# Patient Record
Sex: Female | Born: 1980 | Race: White | Hispanic: No | Smoking: Current some day smoker
Health system: Southern US, Community
[De-identification: ages and names within clinical notes are randomized; demographics above are authoritative.]

## PROBLEM LIST (undated history)

## (undated) DIAGNOSIS — F32A Depression, unspecified: Secondary | ICD-10-CM

## (undated) DIAGNOSIS — I82409 Acute embolism and thrombosis of unspecified deep veins of unspecified lower extremity: Secondary | ICD-10-CM

## (undated) DIAGNOSIS — G709 Myoneural disorder, unspecified: Secondary | ICD-10-CM

## (undated) DIAGNOSIS — K921 Melena: Secondary | ICD-10-CM

## (undated) DIAGNOSIS — G588 Other specified mononeuropathies: Secondary | ICD-10-CM

## (undated) DIAGNOSIS — IMO0002 Reserved for concepts with insufficient information to code with codable children: Secondary | ICD-10-CM

## (undated) DIAGNOSIS — R87629 Unspecified abnormal cytological findings in specimens from vagina: Secondary | ICD-10-CM

## (undated) DIAGNOSIS — G43909 Migraine, unspecified, not intractable, without status migrainosus: Secondary | ICD-10-CM

## (undated) DIAGNOSIS — K219 Gastro-esophageal reflux disease without esophagitis: Secondary | ICD-10-CM

## (undated) DIAGNOSIS — F909 Attention-deficit hyperactivity disorder, unspecified type: Secondary | ICD-10-CM

## (undated) DIAGNOSIS — M329 Systemic lupus erythematosus, unspecified: Secondary | ICD-10-CM

## (undated) DIAGNOSIS — J45909 Unspecified asthma, uncomplicated: Secondary | ICD-10-CM

## (undated) DIAGNOSIS — I499 Cardiac arrhythmia, unspecified: Secondary | ICD-10-CM

## (undated) DIAGNOSIS — F329 Major depressive disorder, single episode, unspecified: Secondary | ICD-10-CM

## (undated) DIAGNOSIS — L309 Dermatitis, unspecified: Secondary | ICD-10-CM

## (undated) HISTORY — DX: Other specified mononeuropathies: G58.8

## (undated) HISTORY — DX: Unspecified asthma, uncomplicated: J45.909

## (undated) HISTORY — DX: Systemic lupus erythematosus, unspecified: M32.9

## (undated) HISTORY — DX: Depression, unspecified: F32.A

## (undated) HISTORY — DX: Major depressive disorder, single episode, unspecified: F32.9

## (undated) HISTORY — PX: CYSTOSCOPY WITH HYDRODISTENSION AND BIOPSY: SHX5127

## (undated) HISTORY — DX: Melena: K92.1

## (undated) HISTORY — DX: Unspecified abnormal cytological findings in specimens from vagina: R87.629

## (undated) HISTORY — DX: Migraine, unspecified, not intractable, without status migrainosus: G43.909

## (undated) HISTORY — DX: Gastro-esophageal reflux disease without esophagitis: K21.9

## (undated) HISTORY — DX: Reserved for concepts with insufficient information to code with codable children: IMO0002

## (undated) HISTORY — PX: CERVICAL CERCLAGE: SHX1329

## (undated) HISTORY — DX: Dermatitis, unspecified: L30.9

## (undated) HISTORY — PX: BLADDER TUMOR EXCISION: SHX238

---
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
## 2004-12-19 ENCOUNTER — Encounter: Payer: Self-pay | Admitting: Orthopedic Surgery

## 2005-01-02 ENCOUNTER — Encounter: Payer: Self-pay | Admitting: Orthopedic Surgery

## 2005-01-20 ENCOUNTER — Ambulatory Visit: Payer: Self-pay | Admitting: Urology

## 2005-05-01 ENCOUNTER — Ambulatory Visit: Payer: Self-pay | Admitting: Urology

## 2006-08-04 HISTORY — PX: ABDOMINAL HYSTERECTOMY: SHX81

## 2006-11-03 ENCOUNTER — Ambulatory Visit: Payer: Self-pay | Admitting: Obstetrics and Gynecology

## 2006-11-09 ENCOUNTER — Inpatient Hospital Stay: Payer: Self-pay | Admitting: Obstetrics and Gynecology

## 2007-04-30 DIAGNOSIS — D239 Other benign neoplasm of skin, unspecified: Secondary | ICD-10-CM

## 2007-04-30 HISTORY — DX: Other benign neoplasm of skin, unspecified: D23.9

## 2009-07-28 ENCOUNTER — Emergency Department: Payer: Self-pay | Admitting: Emergency Medicine

## 2012-02-18 ENCOUNTER — Ambulatory Visit: Payer: Self-pay | Admitting: Family Medicine

## 2012-02-19 ENCOUNTER — Ambulatory Visit: Payer: Self-pay | Admitting: Family Medicine

## 2012-10-07 ENCOUNTER — Ambulatory Visit: Payer: Self-pay | Admitting: Family Medicine

## 2013-03-09 DIAGNOSIS — R310 Gross hematuria: Secondary | ICD-10-CM | POA: Insufficient documentation

## 2013-03-09 DIAGNOSIS — N3010 Interstitial cystitis (chronic) without hematuria: Secondary | ICD-10-CM | POA: Insufficient documentation

## 2013-03-09 DIAGNOSIS — M4716 Other spondylosis with myelopathy, lumbar region: Secondary | ICD-10-CM | POA: Insufficient documentation

## 2013-05-20 DIAGNOSIS — N343 Urethral syndrome, unspecified: Secondary | ICD-10-CM | POA: Insufficient documentation

## 2014-03-27 LAB — HM PAP SMEAR: HM Pap smear: NEGATIVE

## 2014-05-02 DIAGNOSIS — M4126 Other idiopathic scoliosis, lumbar region: Secondary | ICD-10-CM | POA: Insufficient documentation

## 2014-05-02 DIAGNOSIS — M069 Rheumatoid arthritis, unspecified: Secondary | ICD-10-CM | POA: Insufficient documentation

## 2014-09-04 ENCOUNTER — Ambulatory Visit (INDEPENDENT_AMBULATORY_CARE_PROVIDER_SITE_OTHER): Payer: Self-pay | Admitting: Internal Medicine

## 2014-09-04 ENCOUNTER — Encounter (INDEPENDENT_AMBULATORY_CARE_PROVIDER_SITE_OTHER): Payer: Self-pay

## 2014-09-04 ENCOUNTER — Encounter: Payer: Self-pay | Admitting: Internal Medicine

## 2014-09-04 VITALS — BP 108/78 | HR 91 | Temp 98.1°F | Ht 66.33 in | Wt 131.0 lb

## 2014-09-04 DIAGNOSIS — K921 Melena: Secondary | ICD-10-CM | POA: Insufficient documentation

## 2014-09-04 DIAGNOSIS — G43919 Migraine, unspecified, intractable, without status migrainosus: Secondary | ICD-10-CM

## 2014-09-04 DIAGNOSIS — K219 Gastro-esophageal reflux disease without esophagitis: Secondary | ICD-10-CM | POA: Insufficient documentation

## 2014-09-04 DIAGNOSIS — N949 Unspecified condition associated with female genital organs and menstrual cycle: Secondary | ICD-10-CM

## 2014-09-04 DIAGNOSIS — G8929 Other chronic pain: Secondary | ICD-10-CM | POA: Insufficient documentation

## 2014-09-04 DIAGNOSIS — J4520 Mild intermittent asthma, uncomplicated: Secondary | ICD-10-CM

## 2014-09-04 DIAGNOSIS — R102 Pelvic and perineal pain: Secondary | ICD-10-CM

## 2014-09-04 DIAGNOSIS — J45909 Unspecified asthma, uncomplicated: Secondary | ICD-10-CM | POA: Insufficient documentation

## 2014-09-04 DIAGNOSIS — F32A Depression, unspecified: Secondary | ICD-10-CM

## 2014-09-04 DIAGNOSIS — G43909 Migraine, unspecified, not intractable, without status migrainosus: Secondary | ICD-10-CM | POA: Insufficient documentation

## 2014-09-04 DIAGNOSIS — G43109 Migraine with aura, not intractable, without status migrainosus: Secondary | ICD-10-CM | POA: Insufficient documentation

## 2014-09-04 DIAGNOSIS — F329 Major depressive disorder, single episode, unspecified: Secondary | ICD-10-CM

## 2014-09-04 MED ORDER — IMIPRAMINE HCL 25 MG PO TABS: 25.0000 mg | ORAL_TABLET | Freq: Every day | ORAL | Status: DC

## 2014-09-04 MED ORDER — URIBEL 118 MG PO CAPS: 1.0000 | ORAL_CAPSULE | Freq: Four times a day (QID) | ORAL | Status: DC

## 2014-09-04 NOTE — Assessment & Plan Note (Signed)
Not apparent today Continue Iripramine, although not specifically for depression Will check CBC, CMET and TSH at physical exam appt

## 2014-09-04 NOTE — Assessment & Plan Note (Signed)
She declined rectal exam today Will further investigate at her physical exam

## 2014-09-04 NOTE — Assessment & Plan Note (Signed)
Iripramine and Uribel refilled today Continue to follow with pain management

## 2014-09-04 NOTE — Progress Notes (Signed)
Pre visit review using our clinic review tool, if applicable. No additional management support is needed unless otherwise documented below in the visit note. 

## 2014-09-04 NOTE — Progress Notes (Signed)
HPI  Pt presents to the clinic today to establish care and for management of the conditions listed below.  Childhood asthma: None in adulthood. Stopped smoking.  Depression: Has developed recently due to medical issues. Is taking Iripramine daily but does not feel like it is helping. She denies SI/HI.  Migraines: Chronic. Occur behind her right eye. Associated with nausea, vomiting, sensitivity to light and sound. Has to lay in a dark room, put a cold towel over her eyes and put cotton balls up her eyes. Currently not taking any abortive medication for this. Is on Iripramine, but not specifically for the migraines. Has not had one in the last 6 months.  GERD: Ocassional, maybe once per week. Takes Tums when needed. Acidic foods and tomato base products seem to make it flare.  Blood in stool: She first noticed this 06/2014. She had a thrombosed hemorroid lanced 06/2014. Now starting to have pain with BM's for the last 2 weeks after having diarrhea from a GI bug. Has not tried anything OTC. She would like referral to surgeon to have them removed.   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 Referred to [facility] urology for chronic urethral pain and burning. Had MRI of bladder. 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 Laproscopic surgery did not show any evidence of mass. She is now being followed by pain management.  Past Medical History  Diagnosis Date  . Childhood asthma   . Blood in stool   . Depression   . Migraines   . GERD (gastroesophageal reflux disease)     Current Outpatient Prescriptions  Medication Sig Dispense Refill  . D-Mannose POWD Take by mouth. 1 1/2 teaspoon daily    . docusate sodium (COLACE) 250 MG capsule Take 250 mg by mouth daily.    Marland Kitchen imipramine (TOFRANIL) 25 MG tablet Take 25 mg by mouth.    . lidocaine (XYLOCAINE) 2 % jelly APPLY SMALL AMT QID PRN     . Meth-Hyo-M Bl-Na Phos-Ph Sal (URIBEL) 118 MG CAPS Take by mouth.    . Methenamine-Sodium Salicylate 762-831.5 MG TABS Take 1 tablet by mouth as needed.    . Multiple Vitamin (MULTIVITAMIN) tablet Take 1 tablet by mouth daily.    . Phenazopyridine HCl 97.5 MG TABS Take 1 tablet by mouth as needed.    . pregabalin (LYRICA) 50 MG capsule Take 50 mg by mouth 3 (three) times daily.    . Probiotic Product (PRO-BIOTIC BLEND) CAPS Take 1 capsule by mouth daily.    Marland Kitchen tretinoin microspheres (RETIN-A MICRO) 0.04 % gel Apply 1 application topically at bedtime.     No current facility-administered medications for this visit.    No Known Allergies  No family history on file.  History   Social History  . Marital Status: Married    Spouse Name: N/A    Number of Children: N/A  . Years of Education: N/A   Occupational History  . Not on file.   Social History Main Topics  . Smoking status: Former Research scientist (life sciences)  . Smokeless tobacco: Never Used     Comment: quit 2001  . Alcohol Use: No  . Drug Use: Not on file  . Sexual Activity: Not on file   Other Topics Concern  . Not on file   Social History Narrative  . No narrative on file    ROS:  Constitutional: Pt  reports fatigue. Denies fever, malaise, headache or abrupt weight changes.  HEENT: Denies eye pain, eye redness, ear pain, ringing in the ears, wax buildup, runny nose, nasal congestion, bloody nose, or sore throat. Respiratory: Denies difficulty breathing, shortness of breath, cough or sputum production.   Cardiovascular: Denies chest pain, chest tightness, palpitations or swelling in the hands or feet.  Gastrointestinal: Pt reports blood in stool. Abdominal pain, bloating, constipation, diarrhea.  GU: Pt reports urethral pain. Denies frequency, urgency, pain with urination, blood in urine, odor or discharge. Skin: Denies redness, rashes, lesions or ulcercations.  Neurological: Denies dizziness, difficulty with memory, difficulty with  speech or problems with balance and coordination.  Psych: Pt reports depression. Denies anxiety, SI/HI.  No other specific complaints in a complete review of systems (except as listed in HPI above).  PE:  BP 108/78 mmHg  Pulse 91  Temp(Src) 98.1 F (36.7 C) (Oral)  Ht 5' 6.33" (1.685 m)  Wt 131 lb (59.421 kg)  BMI 20.93 kg/m2  SpO2 98% Wt Readings from Last 3 Encounters:  09/04/14 131 lb (59.421 kg)    General: Appears her stated age, well developed, well nourished in NAD. Skin: Warm, dry and intact. No rashes, lesions or ulcerations noted. HEENT: Head: normal shape and size; Eyes: sclera white, no icterus, conjunctiva pink, PERRLA and EOMs intact; Cardiovascular: Normal rate and rhythm. S1,S2 noted.  No murmur, rubs or gallops noted.  Pulmonary/Chest: Normal effort and positive vesicular breath sounds. No respiratory distress. No wheezes, rales or ronchi noted.  Abdomen: Soft and nontender. Normal bowel sounds, no bruits noted. No distention or masses noted. Liver, spleen and kidneys non palpable. Rectal: She opted to defer this until she makes an appointment for her physical exam. Neurological: Alert and oriented.  Psychiatric: Mood and affect normal. Behavior is normal. Judgment and thought content normal.     Assessment and Plan:

## 2014-09-04 NOTE — Patient Instructions (Signed)

## 2014-09-04 NOTE — Assessment & Plan Note (Signed)
She has not had one recently Not on abortive therapy ? If these are true migraines Continue Iripramine for now

## 2014-09-04 NOTE — Assessment & Plan Note (Signed)
Not an issue as an adult

## 2014-09-04 NOTE — Assessment & Plan Note (Signed)
Occasional Continue Tums for now

## 2014-10-03 ENCOUNTER — Ambulatory Visit: Payer: Self-pay | Admitting: Pain Medicine

## 2014-10-24 ENCOUNTER — Encounter: Payer: Self-pay | Admitting: Internal Medicine

## 2014-10-24 ENCOUNTER — Ambulatory Visit (INDEPENDENT_AMBULATORY_CARE_PROVIDER_SITE_OTHER): Payer: BLUE CROSS/BLUE SHIELD | Admitting: Internal Medicine

## 2014-10-24 VITALS — BP 112/80 | HR 88 | Temp 98.4°F | Wt 138.5 lb

## 2014-10-24 DIAGNOSIS — L609 Nail disorder, unspecified: Secondary | ICD-10-CM

## 2014-10-24 DIAGNOSIS — R3989 Other symptoms and signs involving the genitourinary system: Secondary | ICD-10-CM

## 2014-10-24 DIAGNOSIS — N3289 Other specified disorders of bladder: Secondary | ICD-10-CM

## 2014-10-24 DIAGNOSIS — R300 Dysuria: Secondary | ICD-10-CM

## 2014-10-24 DIAGNOSIS — L608 Other nail disorders: Secondary | ICD-10-CM

## 2014-10-24 LAB — POCT URINALYSIS DIPSTICK
Bilirubin, UA: NEGATIVE
Blood, UA: NEGATIVE
Glucose, UA: NEGATIVE
Ketones, UA: NEGATIVE
Leukocytes, UA: NEGATIVE
Nitrite, UA: NEGATIVE
Protein, UA: NEGATIVE
Spec Grav, UA: 1.02
Urobilinogen, UA: NEGATIVE
pH, UA: 6

## 2014-10-24 MED ORDER — CIPROFLOXACIN HCL 500 MG PO TABS: 500.0000 mg | ORAL_TABLET | Freq: Two times a day (BID) | ORAL | Status: DC

## 2014-10-24 MED ORDER — PENTOSAN POLYSULFATE SODIUM 100 MG PO CAPS: 100.0000 mg | ORAL_CAPSULE | Freq: Three times a day (TID) | ORAL | Status: DC

## 2014-10-24 NOTE — Progress Notes (Signed)
HPI  Pt presents to the clinic today with c/o abnormal bladder sensation and dysuria. She reports this starting 2 weeks ago. She describes the sensation as "fluttering" not pressure or pain. She denies fever, chills, nausea or low back pain. She has taken Uribel with some relief. She does have a history of chronic urethral pain. She has seen many urologist in the past. She reports she has frequent UTI's and this feels the same.  Additoinally, she has some concerns about the nail on her left big toe. She noticed this 3 weeks ago.. She has noticed blood draining from the nail. She has not noticed any purulent drainage.She denies any injury to the area. She has never had a fungal infection of the nail that she knows of. She has not tried anything OTC.   Review of Systems  Past Medical History  Diagnosis Date  . Childhood asthma   . Blood in stool   . Depression   . Migraines   . GERD (gastroesophageal reflux disease)     Family History  Problem Relation Age of Onset  . Diabetes Mother   . Hypertension Mother   . Cancer Father     lung  . Diabetes Father   . Hypertension Father   . Heart disease Maternal Grandmother   . Heart disease Maternal Grandfather   . Cancer Paternal Grandfather   . Stroke Neg Hx     History   Social History  . Marital Status: Married    Spouse Name: N/A  . Number of Children: N/A  . Years of Education: N/A   Occupational History  . Not on file.   Social History Main Topics  . Smoking status: Former Research scientist (life sciences)  . Smokeless tobacco: Never Used     Comment: quit 2001  . Alcohol Use: No  . Drug Use: No  . Sexual Activity: Yes   Other Topics Concern  . Not on file   Social History Narrative    No Known Allergies  Constitutional: Denies fever, malaise, fatigue, headache or abrupt weight changes.   GU: Pt reports abnormal bladder sensation, pain with urination. Denies urgency, frequency, burning sensation, blood in urine, odor or discharge. Skin:  Pt report nail discoloration. Denies redness, rashes, lesions or ulcercations.   No other specific complaints in a complete review of systems (except as listed in HPI above).    Objective:   Physical Exam  BP 112/80 mmHg  Pulse 88  Temp(Src) 98.4 F (36.9 C) (Oral)  Wt 138 lb 8 oz (62.823 kg)  Wt Readings from Last 3 Encounters:  10/24/14 138 lb 8 oz (62.823 kg)  09/04/14 131 lb (59.421 kg)    General: Appears her stated age, well developed, well nourished in NAD. Cardiovascular: Normal rate and rhythm. S1,S2 noted.  No murmur, rubs or gallops noted. No JVD or BLE edema. No carotid bruits noted. Pulmonary/Chest: Normal effort and positive vesicular breath sounds. No respiratory distress. No wheezes, rales or ronchi noted.  Abdomen: Soft. Normal bowel sounds, no bruits noted. No distention or masses noted. Liver, spleen and kidneys non palpable. Tender to palpation over the bladder area. No CVA tenderness. Skin: Small open area at base of left big toenail. Horizontal band of what appears to be dried blood underneath the nail. No nail thickening noted. No pain with palpation.     Assessment & Plan:   Abnormal Bladder Sensation and  Dysuria.  ? If this is just r/t to her chronic urethral pain Will start Elmiron  TID for cystitis- continue to follow with urology and pain management Urinalysis: normal (? If Uribel interfered) Will send urine culture eRx sent if for Cipro 500 mg BID x 5 days Stop AZO, Uribel and Cystex Drink plenty of fluids  Left toenail abnormality:  Appears to be trauma related but she denies injury Reassurance given that this is not fungal The area will likely grow out Will continue to monitor for now  RTC as needed or if symptoms persist.

## 2014-10-24 NOTE — Addendum Note (Signed)
Addended by: Lurlean Nanny on: 10/24/2014 02:11 PM   Modules accepted: Orders

## 2014-10-24 NOTE — Patient Instructions (Signed)

## 2014-10-24 NOTE — Progress Notes (Signed)
Pre visit review using our clinic review tool, if applicable. No additional management support is needed unless otherwise documented below in the visit note. 

## 2014-10-24 NOTE — Addendum Note (Signed)
Addended by: Lurlean Nanny on: 10/24/2014 04:39 PM   Modules accepted: Orders

## 2014-10-25 LAB — URINE CULTURE
Colony Count: NO GROWTH
Organism ID, Bacteria: NO GROWTH

## 2014-11-01 DIAGNOSIS — F5104 Psychophysiologic insomnia: Secondary | ICD-10-CM | POA: Insufficient documentation

## 2014-11-13 ENCOUNTER — Encounter: Payer: Self-pay | Admitting: Internal Medicine

## 2014-11-13 ENCOUNTER — Ambulatory Visit (INDEPENDENT_AMBULATORY_CARE_PROVIDER_SITE_OTHER): Payer: BLUE CROSS/BLUE SHIELD | Admitting: Internal Medicine

## 2014-11-13 VITALS — BP 118/70 | HR 88 | Ht 66.33 in | Wt 138.0 lb

## 2014-11-13 DIAGNOSIS — Z0000 Encounter for general adult medical examination without abnormal findings: Secondary | ICD-10-CM

## 2014-11-13 DIAGNOSIS — Z23 Encounter for immunization: Secondary | ICD-10-CM

## 2014-11-13 NOTE — Addendum Note (Signed)
Addended by: Lurlean Nanny on: 11/13/2014 04:14 PM   Modules accepted: Orders

## 2014-11-13 NOTE — Progress Notes (Signed)
Subjective:    Patient ID: Sonia Smith, female    DOB: [birth year], 34 y.o.   MRN: [id number]  HPI  Pt presents to the clinic today for her annual exam  Flu: never Tetanus: 2005 LMP: partial hysterectomy Pap Smear: 2015 Dentist: as needed  Diet: Eat whatever she wants, she doesn't eat a lot of sweets or dairy Exercise: Sedentary at this time.   Review of Systems      Past Medical History  Diagnosis Date  . Childhood asthma   . Blood in stool   . Depression   . Migraines   . GERD (gastroesophageal reflux disease)     Current Outpatient Prescriptions  Medication Sig Dispense Refill  . QUEtiapine (SEROQUEL) 25 MG tablet Take 25 mg by mouth at bedtime.    . D-Mannose POWD Take by mouth. 1 1/2 teaspoon daily    . docusate sodium (COLACE) 250 MG capsule Take 250 mg by mouth daily.    Marland Kitchen imipramine (TOFRANIL) 25 MG tablet Take 1 tablet (25 mg total) by mouth at bedtime. 30 tablet 2  . lidocaine (XYLOCAINE) 2 % jelly APPLY SMALL AMT QID PRN    . Multiple Vitamin (MULTIVITAMIN) tablet Take 1 tablet by mouth daily.    . pentosan polysulfate (ELMIRON) 100 MG capsule Take 1 capsule (100 mg total) by mouth 3 (three) times daily. 90 capsule 2  . pregabalin (LYRICA) 50 MG capsule Take 50 mg by mouth 3 (three) times daily.    . Probiotic Product (PRO-BIOTIC BLEND) CAPS Take 1 capsule by mouth daily.    Marland Kitchen tretinoin microspheres (RETIN-A MICRO) 0.04 % gel Apply 1 application topically at bedtime.     No current facility-administered medications for this visit.    Allergies  Allergen Reactions  . Estradiol Swelling    Skin peels off    Family History  Problem Relation Age of Onset  . Diabetes Mother   . Hypertension Mother   . Cancer Father     lung  . Diabetes Father   . Hypertension Father   . Heart disease Maternal Grandmother   . Heart disease Maternal Grandfather   . Cancer Paternal Grandfather   . Stroke Neg Hx     History   Social History  .  Marital Status: Married    Spouse Name: N/A  . Number of Children: N/A  . Years of Education: N/A   Occupational History  . Not on file.   Social History Main Topics  . Smoking status: Former Research scientist (life sciences)  . Smokeless tobacco: Never Used     Comment: quit 2001  . Alcohol Use: No  . Drug Use: No  . Sexual Activity: Yes   Other Topics Concern  . Not on file   Social History Narrative     Constitutional: Denies fever, malaise, fatigue, headache or abrupt weight changes.  HEENT: Denies eye pain, eye redness, ear pain, ringing in the ears, wax buildup, runny nose, nasal congestion, bloody nose, or sore throat. Respiratory: Denies difficulty breathing, shortness of breath, cough or sputum production.   Cardiovascular: Denies chest pain, chest tightness, palpitations or swelling in the hands or feet.  Gastrointestinal: Denies abdominal pain, bloating, constipation, diarrhea or blood in the stool.  GU: Pt reports chronic urethral pain. Denies urgency, frequency, pain with urination, burning sensation, blood in urine, odor or discharge. Musculoskeletal: Denies decrease in range of motion, difficulty with gait, muscle pain or joint pain and swelling.  Skin: Denies redness, rashes, lesions or ulcercations.  Neurological: Denies dizziness, difficulty with memory, difficulty with speech or problems with balance and coordination.  Psych: Pt reports anxiety and depression. Denies SI/HI.  No other specific complaints in a complete review of systems (except as listed in HPI above).  Objective:   Physical Exam  BP 118/70 mmHg  Pulse 88  Ht 5' 6.33" (1.685 m)  Wt 138 lb (62.596 kg)  BMI 22.05 kg/m2 Wt Readings from Last 3 Encounters:  11/13/14 138 lb (62.596 kg)  10/24/14 138 lb 8 oz (62.823 kg)  09/04/14 131 lb (59.421 kg)    General: Appears her stated age, well developed, well nourished in NAD. Skin: Warm, dry and intact. No rashes, lesions or ulcerations noted. HEENT: Head: normal  shape and size; Eyes: sclera white, no icterus, conjunctiva pink, PERRLA and EOMs intact; Ears: Tm's gray and intact, normal light reflex; Nose: mucosa pink and moist, septum midline; Throat/Mouth: Teeth present, mucosa pink and moist, no exudate, lesions or ulcerations noted.  Neck: Neck supple, trachea midline. No masses, lumps or thyromegaly present.  Cardiovascular: Normal rate and rhythm. S1,S2 noted.  No murmur, rubs or gallops noted. No JVD or BLE edema. No carotid bruits noted. Pulmonary/Chest: Normal effort and positive vesicular breath sounds. No respiratory distress. No wheezes, rales or ronchi noted.  Abdomen: Soft and nontender. Normal bowel sounds, no bruits noted. No distention or masses noted. Liver, spleen and kidneys non palpable. Musculoskeletal: Normal range of motion. Strength 5/5 BUE/BLE.Marland Kitchen No difficulty with gait.  Neurological: Alert and oriented. Cranial nerves II-XII grossly intact. Coordination normal.  Psychiatric: Mood slightly anxious but affect normal. Behavior is normal. Judgment and thought content normal.       Assessment & Plan:   Preventative Health Maintenance:  Encouraged her to consume a diet rich in fruits and veggies Will check CBC, CMET, TSH and Lipid Profile She declines flu shot Tdap today Encourage her to visit a dentist at least on an annual basis  RTC in 1 year or sooner if needed

## 2014-11-13 NOTE — Patient Instructions (Signed)

## 2014-11-13 NOTE — Progress Notes (Signed)
Pre visit review using our clinic review tool, if applicable. No additional management support is needed unless otherwise documented below in the visit note. 

## 2014-11-14 LAB — COMPREHENSIVE METABOLIC PANEL
ALT: 16 U/L (ref 0–35)
AST: 15 U/L (ref 0–37)
Albumin: 4.2 g/dL (ref 3.5–5.2)
Alkaline Phosphatase: 50 U/L (ref 39–117)
BUN: 6 mg/dL (ref 6–23)
CO2: 32 mEq/L (ref 19–32)
Calcium: 9.3 mg/dL (ref 8.4–10.5)
Chloride: 106 mEq/L (ref 96–112)
Creatinine, Ser: 0.78 mg/dL (ref 0.40–1.20)
GFR: 89.99 mL/min (ref >60.00)
Glucose, Bld: 82 mg/dL (ref 70–99)
Potassium: 3.8 mEq/L (ref 3.5–5.1)
Sodium: 141 mEq/L (ref 135–145)
Total Bilirubin: 0.4 mg/dL (ref 0.2–1.2)
Total Protein: 6.9 g/dL (ref 6.0–8.3)

## 2014-11-14 LAB — LIPID PANEL
Cholesterol: 155 mg/dL (ref 0–200)
HDL: 44 mg/dL (ref >39.00)
LDL Cholesterol: 93 mg/dL (ref 0–99)
NonHDL: 111
Total CHOL/HDL Ratio: 4
Triglycerides: 90 mg/dL (ref 0.0–149.0)
VLDL: 18 mg/dL (ref 0.0–40.0)

## 2014-11-14 LAB — CBC
HCT: 41.3 % (ref 36.0–46.0)
Hemoglobin: 14.1 g/dL (ref 12.0–15.0)
MCHC: 34 g/dL (ref 30.0–36.0)
MCV: 92 fl (ref 78.0–100.0)
Platelets: 196 10*3/uL (ref 150.0–400.0)
RBC: 4.49 Mil/uL (ref 3.87–5.11)
RDW: 12.4 % (ref 11.5–15.5)
WBC: 5.8 10*3/uL (ref 4.0–10.5)

## 2014-11-14 LAB — TSH: TSH: 2.07 u[IU]/mL (ref 0.35–4.50)

## 2014-11-15 NOTE — Addendum Note (Signed)
Addended by: Marchia Bond on: 11/15/2014 03:59 PM   Modules accepted: Miquel Dunn

## 2014-11-24 ENCOUNTER — Telehealth: Payer: Self-pay

## 2014-11-24 NOTE — Telephone Encounter (Signed)
PA has been submitted through [employer]--awaiting response [phone]

## 2014-12-06 NOTE — Telephone Encounter (Signed)
Never received correspondence in reference to the outcome. After speaking to a representative at [employer] I was told the PA has to be submitted through [facility]. PA resubmitted through [facility] and awaiting response

## 2014-12-12 NOTE — Telephone Encounter (Signed)
Received denial on Elmiron because it is not being used for relief of bladder pain or discomfort assoc. w/ interstitial cystitis. i did not see this Dx on her problem list but is on her outside problem list from [facility]--please advise if you want to add to current list and resubmit or try alternative

## 2014-12-12 NOTE — Telephone Encounter (Signed)
Add diagnosis to current list and resubmit

## 2014-12-13 NOTE — Telephone Encounter (Signed)
PA has been resubmitted through [email or web address] and new Dx added--

## 2014-12-27 NOTE — Telephone Encounter (Signed)
Received notification the medication has been approved from 12/06/2014-11-01/2015  Ref#[id number] Left detailed msg on VM per HIPAA

## 2015-02-20 ENCOUNTER — Other Ambulatory Visit: Payer: Self-pay | Admitting: *Deleted

## 2015-02-20 MED ORDER — IMIPRAMINE HCL 50 MG PO TABS: 50.0000 mg | ORAL_TABLET | Freq: Every day | ORAL | Status: DC

## 2015-02-20 NOTE — Telephone Encounter (Signed)
Faxed refill request. Previously filled at [facility] office (Urology).  Please advise.

## 2015-03-22 ENCOUNTER — Encounter: Payer: Self-pay | Admitting: *Deleted

## 2015-03-22 DIAGNOSIS — R339 Retention of urine, unspecified: Secondary | ICD-10-CM | POA: Insufficient documentation

## 2015-03-27 ENCOUNTER — Encounter: Payer: Self-pay | Admitting: Obstetrics and Gynecology

## 2015-03-27 ENCOUNTER — Ambulatory Visit (INDEPENDENT_AMBULATORY_CARE_PROVIDER_SITE_OTHER): Payer: 59 | Admitting: Obstetrics and Gynecology

## 2015-03-27 VITALS — BP 110/74 | HR 78 | Ht 67.0 in | Wt 136.5 lb

## 2015-03-27 DIAGNOSIS — M792 Neuralgia and neuritis, unspecified: Secondary | ICD-10-CM

## 2015-03-27 DIAGNOSIS — G588 Other specified mononeuropathies: Secondary | ICD-10-CM

## 2015-03-27 DIAGNOSIS — Z01419 Encounter for gynecological examination (general) (routine) without abnormal findings: Secondary | ICD-10-CM

## 2015-03-27 NOTE — Progress Notes (Signed)
  Subjective:     Sonia Smith is a 34 y.o. female and is here for a comprehensive physical exam. 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  Social History   Social History  . Marital Status: Married    Spouse Name: N/A  . Number of Children: N/A  . Years of Education: N/A   Occupational History  . Not on file.   Social History Main Topics  . Smoking status: Former Research scientist (life sciences)  . Smokeless tobacco: Never Used     Comment: quit 2001  . Alcohol Use: No  . Drug Use: No  . Sexual Activity: Yes    Birth Control/ Protection: Surgical   Other Topics Concern  . Not on file   Social History Narrative   Health Maintenance  Topic Date Due  . HIV Screening  02/15/1996  . INFLUENZA VACCINE  03/05/2015  . TETANUS/TDAP  11/12/2024    The following portions of the patient's history were reviewed and updated as appropriate: allergies, current medications, past family history, past medical history, past social history, past surgical history and problem list.  Review of Systems A comprehensive review of systems was negative except for: Genitourinary: positive for frequency and pain with vaginal penetration Integument/breast: positive for skin lesion(s) and like infected hair follicles where ever she shaves   Objective:    General appearance: alert, cooperative and appears stated age Neck: no adenopathy, no carotid bruit, no JVD, supple, symmetrical, trachea midline and thyroid not enlarged, symmetric, no tenderness/mass/nodules Lungs: clear to auscultation bilaterally Breasts: normal appearance, no masses or tenderness Heart: regular rate and rhythm, S1, S2 normal, no murmur, click, rub or gallop Abdomen: soft, non-tender; bowel sounds normal; no masses,  no organomegaly Pelvic: cervix normal in appearance, external genitalia normal, no adnexal masses or tenderness, no cervical motion  tenderness, rectovaginal septum normal, uterus normal size, shape, and consistency and vagina normal without discharge Skin: Skin color, texture, turgor normal. No rashes or lesions or pimple like lesions scattered    Assessment:    Healthy female exam. 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 Referred to pelvic floor PT/rehab at [facility]    See After Visit Summary for Counseling Recommendations

## 2015-03-27 NOTE — Patient Instructions (Signed)
  Place annual gynecologic exam patient instructions here.  Thank you for enrolling in [facility]. Please follow the instructions below to securely access your online medical record. MyChart allows you to send messages to your doctor, view your test results, manage appointments, and more.   How Do I Sign Up? 1. In your Internet browser, go to [employer] and enter [email or web address]. 2. Click on the Sign Up Now link in the Sign In box. You will see the New Member Sign Up page. 3. Enter your MyChart Access Code exactly as it appears below. You will not need to use this code after you've completed the sign-up process. If you do not sign up before the expiration date, you must request a new code.  MyChart Access Code: [email or web address] Expires: 05/26/2015  3:11 PM  4. Enter your Social Security Number ([id number]) and Date of Birth (mm/dd/yyyy) as indicated and click Submit. You will be taken to the next sign-up page. 5. Create a MyChart ID. This will be your MyChart login ID and cannot be changed, so think of one that is secure and easy to remember. 6. Create a MyChart password. You can change your password at any time. 7. Enter your Password Reset Question and Answer. This can be used at a later time if you forget your password.  8. Enter your e-mail address. You will receive e-mail notification when new information is available in [facility]. 9. Click Sign Up. You can now view your medical record.   Additional Information Remember, MyChart is NOT to be used for urgent needs. For medical emergencies, dial 911.

## 2015-03-28 ENCOUNTER — Other Ambulatory Visit: Payer: Self-pay | Admitting: Obstetrics and Gynecology

## 2015-03-29 LAB — CYTOLOGY - PAP

## 2015-04-02 ENCOUNTER — Encounter: Payer: Self-pay | Admitting: *Deleted

## 2015-05-01 ENCOUNTER — Encounter: Payer: Self-pay | Admitting: Internal Medicine

## 2015-05-01 ENCOUNTER — Ambulatory Visit (INDEPENDENT_AMBULATORY_CARE_PROVIDER_SITE_OTHER): Payer: 59 | Admitting: Internal Medicine

## 2015-05-01 VITALS — BP 120/88 | HR 103 | Temp 97.5°F | Wt 132.0 lb

## 2015-05-01 DIAGNOSIS — R1013 Epigastric pain: Secondary | ICD-10-CM | POA: Diagnosis not present

## 2015-05-01 DIAGNOSIS — R112 Nausea with vomiting, unspecified: Secondary | ICD-10-CM

## 2015-05-01 DIAGNOSIS — R350 Frequency of micturition: Secondary | ICD-10-CM

## 2015-05-01 DIAGNOSIS — R300 Dysuria: Secondary | ICD-10-CM

## 2015-05-01 LAB — POCT URINALYSIS DIPSTICK
Bilirubin, UA: NEGATIVE
Blood, UA: NEGATIVE
Glucose, UA: NEGATIVE
Nitrite, UA: POSITIVE
Protein, UA: NEGATIVE
Spec Grav, UA: >=1.03
Urobilinogen, UA: 0.2
pH, UA: 5.5

## 2015-05-01 MED ORDER — NITROFURANTOIN MONOHYD MACRO 100 MG PO CAPS: 100.0000 mg | ORAL_CAPSULE | Freq: Two times a day (BID) | ORAL | Status: DC

## 2015-05-01 NOTE — Patient Instructions (Signed)

## 2015-05-01 NOTE — Progress Notes (Signed)
HPI  Pt presents to the clinic today with c/o dysuria and frequency. This started 4 days ago. She has had some abdominal pain,nausea and vomiting as well but is not sure it is related. The pain is in the epigastric area. She describes it as cramping. She denies fever, chills or body aches. She has taken leftover Macrobid and AZO x 2 days for her symptoms. She has been taking Tums and Pepto Bismol with some relief. She reports the abdominal symptoms have improved but not the dysuria.   Review of Systems  Past Medical History  Diagnosis Date  . Childhood asthma   . Blood in stool   . Depression   . Migraines   . GERD (gastroesophageal reflux disease)   . Vaginal Pap smear, abnormal   . Lupus   . Painful intercourse     Family History  Problem Relation Age of Onset  . Diabetes Mother   . Hypertension Mother   . Cancer Father     lung  . Diabetes Father   . Hypertension Father   . Heart disease Maternal Grandmother   . Heart disease Maternal Grandfather   . Cancer Paternal Grandfather   . Stroke Neg Hx     Social History   Social History  . Marital Status: Married    Spouse Name: N/A  . Number of Children: N/A  . Years of Education: N/A   Occupational History  . Not on file.   Social History Main Topics  . Smoking status: Former Research scientist (life sciences)  . Smokeless tobacco: Never Used     Comment: quit 2001  . Alcohol Use: No  . Drug Use: No  . Sexual Activity: Yes    Birth Control/ Protection: Surgical   Other Topics Concern  . Not on file   Social History Narrative    Allergies  Allergen Reactions  . Estradiol Swelling    Skin peels off Skin peels off    Constitutional: Denies fever, malaise, fatigue, headache or abrupt weight changes.   GI: Pt reports abdominal pain, nausea and vomiting. Denies diarrhea or blood in her stool. GU: Pt reports frequency and pain with urination. Denies urgency, burning sensation, blood in urine, odor or discharge. Skin: Denies redness,  rashes, lesions or ulcercations.   No other specific complaints in a complete review of systems (except as listed in HPI above).    Objective:   Physical Exam  BP 120/88 mmHg  Pulse 103  Temp(Src) 97.5 F (36.4 C) (Oral)  Wt 132 lb (59.875 kg)  SpO2 98%  Wt Readings from Last 3 Encounters:  05/01/15 132 lb (59.875 kg)  03/27/15 136 lb 8 oz (61.916 kg)  11/13/14 138 lb (62.596 kg)    General: Appears her stated age, well developed, well nourished in NAD. Cardiovascular: Tachycardic with normal rhythm. S1,S2 noted.   Pulmonary/Chest: Normal effort and positive vesicular breath sounds. No respiratory distress. No wheezes, rales or ronchi noted.  Abdomen: Soft. Normal bowel sounds. No distention or masses noted.  Tender to palpation over the bladder area and mildly tender in the epigastric area. No CVA tenderness.      Assessment & Plan:   Urgency, Dysuria secondary to possible UTI:  Urinalysis: +1 leuks, no blood, + nitrites (but I think Pyridium interfered) Will send urine culture eRx for Macrobid 100 mg BID x 5 days Continue to take AZO OTC Drink plenty of fluids  Epigastric pain, nausea and vomiting:  Likely viral GI illness She has noticed some improvement  Push fluids She declines RX for antinausea medication  RTC as needed or if symptoms persist.

## 2015-05-01 NOTE — Progress Notes (Signed)
Pre visit review using our clinic review tool, if applicable. No additional management support is needed unless otherwise documented below in the visit note. 

## 2015-05-01 NOTE — Addendum Note (Signed)
Addended by: Lurlean Nanny on: 05/01/2015 04:13 PM   Modules accepted: Orders

## 2015-05-02 LAB — URINE CULTURE
Colony Count: NO GROWTH
Organism ID, Bacteria: NO GROWTH

## 2015-05-24 ENCOUNTER — Encounter: Payer: Self-pay | Admitting: Internal Medicine

## 2015-05-28 ENCOUNTER — Other Ambulatory Visit: Payer: Self-pay | Admitting: Internal Medicine

## 2015-05-28 MED ORDER — NITROFURANTOIN MONOHYD MACRO 100 MG PO CAPS: 100.0000 mg | ORAL_CAPSULE | Freq: Every day | ORAL | Status: DC

## 2015-07-09 ENCOUNTER — Encounter: Payer: Self-pay | Admitting: Internal Medicine

## 2015-07-23 ENCOUNTER — Encounter: Payer: Self-pay | Admitting: Internal Medicine

## 2015-07-26 ENCOUNTER — Ambulatory Visit: Payer: 59 | Admitting: Physical Therapy

## 2015-07-31 ENCOUNTER — Ambulatory Visit (INDEPENDENT_AMBULATORY_CARE_PROVIDER_SITE_OTHER): Payer: 59 | Admitting: Internal Medicine

## 2015-07-31 ENCOUNTER — Encounter: Payer: Self-pay | Admitting: Internal Medicine

## 2015-07-31 VITALS — BP 110/74 | HR 100 | Wt 134.0 lb

## 2015-07-31 DIAGNOSIS — B373 Candidiasis of vulva and vagina: Secondary | ICD-10-CM

## 2015-07-31 DIAGNOSIS — N898 Other specified noninflammatory disorders of vagina: Secondary | ICD-10-CM | POA: Diagnosis not present

## 2015-07-31 DIAGNOSIS — K644 Residual hemorrhoidal skin tags: Secondary | ICD-10-CM

## 2015-07-31 DIAGNOSIS — K648 Other hemorrhoids: Secondary | ICD-10-CM | POA: Diagnosis not present

## 2015-07-31 DIAGNOSIS — B3731 Acute candidiasis of vulva and vagina: Secondary | ICD-10-CM

## 2015-07-31 LAB — POCT URINALYSIS DIPSTICK
Bilirubin, UA: NEGATIVE
Blood, UA: NEGATIVE
Glucose, UA: NEGATIVE
Ketones, UA: NEGATIVE
Nitrite, UA: NEGATIVE
Spec Grav, UA: 1.025
Urobilinogen, UA: NEGATIVE
pH, UA: 6

## 2015-07-31 MED ORDER — FLUCONAZOLE 150 MG PO TABS: 150.0000 mg | ORAL_TABLET | Freq: Once | ORAL | Status: DC

## 2015-07-31 MED ORDER — HYDROCORTISONE ACETATE 25 MG RE SUPP: 25.0000 mg | Freq: Two times a day (BID) | RECTAL | Status: DC

## 2015-07-31 MED ORDER — CLOBETASOL PROPIONATE 0.05 % EX CREA: 1.0000 "application " | TOPICAL_CREAM | Freq: Two times a day (BID) | CUTANEOUS | Status: DC

## 2015-07-31 NOTE — Progress Notes (Signed)
Subjective:    Patient ID: Sonia Smith, female    DOB: [birth year], 34 y.o.   MRN: [id number]  HPI  Pt presents to the clinic today with c/o pain in her tailbone. She reports this started 2-3 days ago. She reports that she woke up with the pain. She describes the pain as soreness. There is associated numbness. The pain radiates to her rectum. She has been having really bad constipation since being back on Lyrica. Her last BM was 4 days ago. She denies blood in her stool. She has not had any injury to the area. She does have chronic pelvic pain but reports this pain is different.  She also c/o vaginal burning and irritation. This started yesterday after the had applied some lidocaine ointment that she uses for her chronic urethral pain. This morning she woke up with the area feeling raw. She has no noticed any vaginal discharge or itching. She reports she was recently on antibiotics 2 weeks ago for a UTI.  Review of Systems      Past Medical History  Diagnosis Date  . Childhood asthma   . Blood in stool   . Depression   . Migraines   . GERD (gastroesophageal reflux disease)   . Vaginal Pap smear, abnormal   . Lupus ([place])   . Painful intercourse     Current Outpatient Prescriptions  Medication Sig Dispense Refill  . docusate sodium (COLACE) 250 MG capsule Take 250 mg by mouth daily.    Marland Kitchen gabapentin (NEURONTIN) 600 MG tablet Take 600 mg by mouth 3 (three) times daily.    Marland Kitchen ibuprofen (ADVIL,MOTRIN) 400 MG tablet Take 400 mg by mouth as needed.    Marland Kitchen imipramine (TOFRANIL) 50 MG tablet Take 1 tablet (50 mg total) by mouth at bedtime. 30 tablet 5  . nitrofurantoin, macrocrystal-monohydrate, (MACROBID) 100 MG capsule Take 1 capsule (100 mg total) by mouth daily. 30 capsule 2  . Probiotic Product (PRO-BIOTIC BLEND) CAPS Take 1 capsule by mouth daily.    . QUEtiapine (SEROQUEL) 25 MG tablet Take 25 mg by mouth at bedtime. Every 3 days    . tretinoin microspheres (RETIN-A MICRO)  0.04 % gel Apply 1 application topically at bedtime.    . Meth-Hyo-M Bl-Na Phos-Ph Sal (URIBEL) 118 MG CAPS Take 1 capsule by mouth 2 (two) times daily. Reported on 07/31/2015     No current facility-administered medications for this visit.    Allergies  Allergen Reactions  . Estradiol Swelling    Skin peels off Skin peels off    Family History  Problem Relation Age of Onset  . Diabetes Mother   . Hypertension Mother   . Cancer Father     lung  . Diabetes Father   . Hypertension Father   . Heart disease Maternal Grandmother   . Heart disease Maternal Grandfather   . Cancer Paternal Grandfather   . Stroke Neg Hx     Social History   Social History  . Marital Status: Married    Spouse Name: N/A  . Number of Children: N/A  . Years of Education: N/A   Occupational History  . Not on file.   Social History Main Topics  . Smoking status: Former Research scientist (life sciences)  . Smokeless tobacco: Never Used     Comment: quit 2001  . Alcohol Use: No  . Drug Use: No  . Sexual Activity: Yes    Birth Control/ Protection: Surgical   Other Topics Concern  . Not on  file   Social History Narrative     Constitutional: Denies fever, malaise, fatigue, headache or abrupt weight changes.  Respiratory: Denies difficulty breathing, shortness of breath, cough or sputum production.   Cardiovascular: Denies chest pain, chest tightness, palpitations or swelling in the hands or feet.  Gastrointestinal: Pt reports rectal pain. Denies abdominal pain, bloating, constipation, diarrhea or blood in the stool.  GU: Pt reports vaginal irritation. Denies urgency, frequency, pain with urination, burning sensation, blood in urine, odor or discharge. Musculoskeletal: Pt reports tailbone pain. Denies decrease in range of motion, difficulty with gait, muscle pain or joint pain and swelling.    No other specific complaints in a complete review of systems (except as listed in HPI above).  Objective:   Physical  Exam BP 110/74 mmHg  Pulse 100  Wt 134 lb (60.782 kg)  SpO2 97%  Wt Readings from Last 3 Encounters:  07/31/15 134 lb (60.782 kg)  05/01/15 132 lb (59.875 kg)  03/27/15 136 lb 8 oz (61.916 kg)    General: Appears her stated age, well developed, well nourished in NAD. Cardiovascular: Normal rate and rhythm. S1,S2 noted.   Pulmonary/Chest: Normal effort and positive vesicular breath sounds. No respiratory distress. No wheezes, rales or ronchi noted.  Abdomen: Soft and nontender. Normal bowel sounds. Pelvic: Normal female anatomy. Internal labia swollen and erythematous. No discharge noted. Rectal: External hemorrhoid noted. Internal hemorrhoid noted. Normal rectal tone.  BMET    Component Value Date/Time   NA 141 11/13/2014 1524   K 3.8 11/13/2014 1524   CL 106 11/13/2014 1524   CO2 32 11/13/2014 1524   GLUCOSE 82 11/13/2014 1524   BUN 6 11/13/2014 1524   CREATININE 0.78 11/13/2014 1524   CALCIUM 9.3 11/13/2014 1524    Lipid Panel     Component Value Date/Time   CHOL 155 11/13/2014 1524   TRIG 90.0 11/13/2014 1524   HDL 44.00 11/13/2014 1524   CHOLHDL 4 11/13/2014 1524   VLDL 18.0 11/13/2014 1524   LDLCALC 93 11/13/2014 1524    CBC    Component Value Date/Time   WBC 5.8 11/13/2014 1524   RBC 4.49 11/13/2014 1524   HGB 14.1 11/13/2014 1524   HCT 41.3 11/13/2014 1524   PLT 196.0 11/13/2014 1524   MCV 92.0 11/13/2014 1524   MCHC 34.0 11/13/2014 1524   RDW 12.4 11/13/2014 1524    Hgb A1C No results found for: HGBA1C       Assessment & Plan:   Vaginal irritation due to contact dermatitis, candida vaginitis:  Urinalysis: trace leuks, trace protein Wet prep: + yeast, no trich, no clue cells eRx for Diflucan for yeast infection eRx for Clobetasol cream to affected area BID until resolved, warming given about overuse and causing atrophy of the skin  Hemorrhoids:  eRx for Anusol suppositories Referral placed to GI for evaluation of hemorrhoid per pt  requets  RTC as needed or if symptoms persist or worsen

## 2015-07-31 NOTE — Patient Instructions (Signed)

## 2015-07-31 NOTE — Progress Notes (Signed)
Pre visit review using our clinic review tool, if applicable. No additional management support is needed unless otherwise documented below in the visit note. 

## 2015-08-19 ENCOUNTER — Encounter: Payer: Self-pay | Admitting: Emergency Medicine

## 2015-08-19 ENCOUNTER — Emergency Department
Admission: EM | Admit: 2015-08-19 | Discharge: 2015-08-19 | Disposition: A | Discharge status: Other Acute Inpatient Hospital | Payer: 59 | Attending: Emergency Medicine | Admitting: Emergency Medicine

## 2015-08-19 DIAGNOSIS — Z87891 Personal history of nicotine dependence: Secondary | ICD-10-CM | POA: Diagnosis not present

## 2015-08-19 DIAGNOSIS — F4321 Adjustment disorder with depressed mood: Secondary | ICD-10-CM | POA: Diagnosis not present

## 2015-08-19 DIAGNOSIS — Z79899 Other long term (current) drug therapy: Secondary | ICD-10-CM | POA: Insufficient documentation

## 2015-08-19 DIAGNOSIS — N368 Other specified disorders of urethra: Secondary | ICD-10-CM | POA: Insufficient documentation

## 2015-08-19 DIAGNOSIS — F419 Anxiety disorder, unspecified: Secondary | ICD-10-CM | POA: Diagnosis not present

## 2015-08-19 DIAGNOSIS — R45851 Suicidal ideations: Secondary | ICD-10-CM

## 2015-08-19 LAB — URINALYSIS COMPLETE WITH MICROSCOPIC (ARMC ONLY)
Bilirubin Urine: NEGATIVE
Glucose, UA: NEGATIVE mg/dL
Hgb urine dipstick: NEGATIVE
Ketones, ur: NEGATIVE mg/dL
Leukocytes, UA: NEGATIVE
Nitrite: NEGATIVE
Protein, ur: NEGATIVE mg/dL
Specific Gravity, Urine: 1.008 (ref 1.005–1.030)
pH: 6 (ref 5.0–8.0)

## 2015-08-19 LAB — URINE DRUG SCREEN, QUALITATIVE (ARMC ONLY)
Amphetamines, Ur Screen: NOT DETECTED
Barbiturates, Ur Screen: NOT DETECTED
Benzodiazepine, Ur Scrn: NOT DETECTED
Cannabinoid 50 Ng, Ur ~~LOC~~: NOT DETECTED
Cocaine Metabolite,Ur ~~LOC~~: NOT DETECTED
MDMA (Ecstasy)Ur Screen: NOT DETECTED
Methadone Scn, Ur: NOT DETECTED
Opiate, Ur Screen: NOT DETECTED
Phencyclidine (PCP) Ur S: NOT DETECTED
Tricyclic, Ur Screen: POSITIVE — AB

## 2015-08-19 LAB — CBC
HCT: 44 % (ref 35.0–47.0)
Hemoglobin: 14.8 g/dL (ref 12.0–16.0)
MCH: 30.6 pg (ref 26.0–34.0)
MCHC: 33.7 g/dL (ref 32.0–36.0)
MCV: 90.8 fL (ref 80.0–100.0)
Platelets: 189 10*3/uL (ref 150–440)
RBC: 4.85 MIL/uL (ref 3.80–5.20)
RDW: 12.7 % (ref 11.5–14.5)
WBC: 8.5 10*3/uL (ref 3.6–11.0)

## 2015-08-19 LAB — COMPREHENSIVE METABOLIC PANEL
ALT: 25 U/L (ref 14–54)
AST: 20 U/L (ref 15–41)
Albumin: 4.6 g/dL (ref 3.5–5.0)
Alkaline Phosphatase: 51 U/L (ref 38–126)
Anion gap: 6 (ref 5–15)
BUN: 8 mg/dL (ref 6–20)
CO2: 31 mmol/L (ref 22–32)
Calcium: 9.4 mg/dL (ref 8.9–10.3)
Chloride: 105 mmol/L (ref 101–111)
Creatinine, Ser: 0.73 mg/dL (ref 0.44–1.00)
GFR calc Af Amer: >60 mL/min (ref >60)
GFR calc non Af Amer: >60 mL/min (ref >60)
Glucose, Bld: 107 mg/dL — ABNORMAL HIGH (ref 65–99)
Potassium: 4.6 mmol/L (ref 3.5–5.1)
Sodium: 142 mmol/L (ref 135–145)
Total Bilirubin: 0.5 mg/dL (ref 0.3–1.2)
Total Protein: 7.6 g/dL (ref 6.5–8.1)

## 2015-08-19 LAB — SALICYLATE LEVEL: Salicylate Lvl: <4 mg/dL (ref 2.8–30.0)

## 2015-08-19 LAB — ETHANOL: Alcohol, Ethyl (B): 10 mg/dL — ABNORMAL HIGH (ref <5)

## 2015-08-19 LAB — ACETAMINOPHEN LEVEL: Acetaminophen (Tylenol), Serum: <10 ug/mL — ABNORMAL LOW (ref 10–30)

## 2015-08-19 MED ORDER — ACETAMINOPHEN 500 MG PO TABS: 1000.0000 mg | ORAL_TABLET | Freq: Once | ORAL | Status: DC

## 2015-08-19 MED ORDER — ACETAMINOPHEN 500 MG PO TABS
ORAL_TABLET | ORAL | Status: AC
  Filled 2015-08-19: qty 2

## 2015-08-19 NOTE — ED Notes (Signed)
Patient resting quietly in room. No noted distress or abnormal behaviors noted. Will continue 15 minute checks and observation by security camera for safety. 

## 2015-08-19 NOTE — Consult Note (Signed)
[facility] Face-to-Face Psychiatry Consult   Reason for Consult:  Depression and suicidal thought Referring Physician:  Lisa Roca M.D. Patient Identification: Sonia Smith MRN:  [id number] Principal Diagnosis: Adjustment disorder depressed mood Diagnosis:   Patient Active Problem List   Diagnosis Date Noted  . Childhood asthma [J45.909] 09/04/2014  . Depression [F32.9] 09/04/2014  . Migraines [G43.909] 09/04/2014  . GERD (gastroesophageal reflux disease) [K21.9] 09/04/2014  . Blood in stool [K92.1] 09/04/2014  . Chronic pelvic pain in female [N94.9, G89.29] 09/04/2014  . Chronic interstitial cystitis [N30.10] 03/09/2013    Total Time spent with patient: 1 hour  Subjective:   Sonia Smith is a 35 y.o. female patient admitted on involuntary commitment after she was brought from home by family members due to suicidal ideations after having an argument with her husband.  HPI:   Most of the history was obtained from the patient as well as review of the chart. Patient reported that she got into the fight with her husband and took a handful of her daughter's Zoloft but then she immediately spit them out. She reported that her mother  called the police. Patient reported that her daughter has been prescribed Zoloft when she was admitted to [facility] in November. Patient reported that her daughter has getting into troubles at the school and was cutting herself. She went to [facility] in November and was evaluated by [facility] psychiatrist. She was immediately placed on involuntary commitment and was transferred to [facility]. She met a child psychiatrist and decided to continue treatment with her. However her appointment was changed to January 23 as the physician became sick. Patient reported that her daughter is currently struggling with her homework and is getting failing grades. Patient was trying to help her and was completing her  schoolwork. Her husband became agitated and was upset with the patient as she was helping her daughter. He was arguing with her and threatening her that he does not want to be married anymore. He was yelling at her and patient became upset and took a handful of her daughter's medication. After she spit the pills out,  she walked to her stepfather's grave as she always talks to him whenever she becomes upset. She reported that she always go there After she came back in 15 minutes she was told by her husband that the police has been looking for her. Her husband has called the police as he was concerned about her safety. Patient currently denied having any suicidal ideations or plans. She reported that she has never attempted suicide in the past.     Past Psychiatric History:  Patient reported that she is was seen by a psychiatrist when she was 35 years old as she was having some relationship issues with her mother who is chronically depressed. Her mother also has history of fibromyalgia. She reported that she does not take any psychotropic medications at this time. Patient denied any previous history of psychiatric illness.   Risk to Self: Suicidal Ideation: Yes-Currently Present Suicidal Intent: Yes-Currently Present Is patient at risk for suicide?: Yes Suicidal Plan?: Yes-Currently Present Specify Current Suicidal Plan: Pt planned to take a handful of her daughters medications Access to Means: Yes Specify Access to Suicidal Means: Pt has access to prescription meds What has been your use of drugs/alcohol within the last 12 months?: None reported How many times?: 0 Other Self Harm Risks: None identified Triggers for Past Attempts: None known Intentional Self Injurious Behavior:  None Risk to Others: Homicidal Ideation: No Thoughts of Harm to Others: No Current Homicidal Intent: No Current Homicidal Plan: No Access to Homicidal Means: No Identified Victim: None identified History of harm to  others?: No Assessment of Violence: None Noted Violent Behavior Description: None identified Does patient have access to weapons?: No Criminal Charges Pending?: No Does patient have a court date: No Prior Inpatient Therapy: Prior Inpatient Therapy: No Prior Therapy Dates: N/A Prior Therapy Facilty/Provider(s): N/A Reason for Treatment: N/A Prior Outpatient Therapy: Prior Outpatient Therapy: No Prior Therapy Dates: N/a Prior Therapy Facilty/Provider(s): N/A Reason for Treatment: N/A Does patient have an ACCT team?: No Does patient have Intensive In-House Services?  : No Does patient have [facility] services? : No Does patient have P4CC services?: No  Past Medical History:  Past Medical History  Diagnosis Date  . Childhood asthma   . Blood in stool   . Depression   . Migraines   . GERD (gastroesophageal reflux disease)   . Vaginal Pap smear, abnormal   . Lupus ([place])   . Painful intercourse     Past Surgical History  Procedure Laterality Date  . Abdominal hysterectomy  2008  . Leep  1998    dysplasia  . Cervical cerclage  2001, 2004  . Bladder tumor excision  2006, 05/2014    benign tumor removed    Family History:  Family History  Problem Relation Age of Onset  . Diabetes Mother   . Hypertension Mother   . Cancer Father     lung  . Diabetes Father   . Hypertension Father   . Heart disease Maternal Grandmother   . Heart disease Maternal Grandfather   . Cancer Paternal Grandfather   . Stroke Neg Hx    Family Psychiatric  History: Mother has history of chronic depression.  Social History:  History  Alcohol Use No     History  Drug Use No    Social History   Social History  . Marital Status: Married    Spouse Name: N/A  . Number of Children: N/A  . Years of Education: N/A   Social History Main Topics  . Smoking status: Former Research scientist (life sciences)  . Smokeless tobacco: Never Used     Comment: quit 2001  . Alcohol Use: No  . Drug Use: No  . Sexual Activity: Yes     Birth Control/ Protection: Surgical   Other Topics Concern  . None   Social History Narrative   Additional Social History:  patient is currently married for the past 15 years. She has 2 children ages 85 years old daughter and a 79 year old son. Her daughter has history of cutting herself and is depressed. She reported that she has history of bladder problems and is following pain clinic in [facility]. She is also following with the urologist on a regular basis.    History of alcohol / drug use?: No history of alcohol / drug abuse                     Allergies:   Allergies  Allergen Reactions  . Estradiol Swelling and Other (See Comments)    Skin peels off    Labs:  Results for orders placed or performed during the hospital encounter of 08/19/15 (from the past 48 hour(s))  Comprehensive metabolic panel     Status: Abnormal   Collection Time: 08/19/15  1:58 AM  Result Value Ref Range   Sodium 142 135 - 145 mmol/L  Potassium 4.6 3.5 - 5.1 mmol/L   Chloride 105 101 - 111 mmol/L   CO2 31 22 - 32 mmol/L   Glucose, Bld 107 (H) 65 - 99 mg/dL   BUN 8 6 - 20 mg/dL   Creatinine, Ser 0.73 0.44 - 1.00 mg/dL   Calcium 9.4 8.9 - 10.3 mg/dL   Total Protein 7.6 6.5 - 8.1 g/dL   Albumin 4.6 3.5 - 5.0 g/dL   AST 20 15 - 41 U/L   ALT 25 14 - 54 U/L   Alkaline Phosphatase 51 38 - 126 U/L   Total Bilirubin 0.5 0.3 - 1.2 mg/dL   GFR calc non Af Amer >60 >60 mL/min   GFR calc Af Amer >60 >60 mL/min    Comment: (NOTE) The eGFR has been calculated using the CKD EPI equation. This calculation has not been validated in all clinical situations. eGFR's persistently <60 mL/min signify possible Chronic Kidney Disease.    Anion gap 6 5 - 15  Ethanol (ETOH)     Status: Abnormal   Collection Time: 08/19/15  1:58 AM  Result Value Ref Range   Alcohol, Ethyl (B) 10 (H) <5 mg/dL    Comment:        LOWEST DETECTABLE LIMIT FOR SERUM ALCOHOL IS 5 mg/dL FOR MEDICAL PURPOSES ONLY   Salicylate  level     Status: None   Collection Time: 08/19/15  1:58 AM  Result Value Ref Range   Salicylate Lvl <8.7 2.8 - 30.0 mg/dL  Acetaminophen level     Status: Abnormal   Collection Time: 08/19/15  1:58 AM  Result Value Ref Range   Acetaminophen (Tylenol), Serum <10 (L) 10 - 30 ug/mL    Comment:        THERAPEUTIC CONCENTRATIONS VARY SIGNIFICANTLY. A RANGE OF 10-30 ug/mL MAY BE AN EFFECTIVE CONCENTRATION FOR MANY PATIENTS. HOWEVER, SOME ARE BEST TREATED AT CONCENTRATIONS OUTSIDE THIS RANGE. ACETAMINOPHEN CONCENTRATIONS >150 ug/mL AT 4 HOURS AFTER INGESTION AND >50 ug/mL AT 12 HOURS AFTER INGESTION ARE OFTEN ASSOCIATED WITH TOXIC REACTIONS.   CBC     Status: None   Collection Time: 08/19/15  1:58 AM  Result Value Ref Range   WBC 8.5 3.6 - 11.0 K/uL   RBC 4.85 3.80 - 5.20 MIL/uL   Hemoglobin 14.8 12.0 - 16.0 g/dL   HCT 44.0 35.0 - 47.0 %   MCV 90.8 80.0 - 100.0 fL   MCH 30.6 26.0 - 34.0 pg   MCHC 33.7 32.0 - 36.0 g/dL   RDW 12.7 11.5 - 14.5 %   Platelets 189 150 - 440 K/uL  Urine Drug Screen, Qualitative ([facility] only)     Status: Abnormal   Collection Time: 08/19/15  1:58 AM  Result Value Ref Range   Tricyclic, Ur Screen POSITIVE (A) NONE DETECTED   Amphetamines, Ur Screen NONE DETECTED NONE DETECTED   MDMA (Ecstasy)Ur Screen NONE DETECTED NONE DETECTED   Cocaine Metabolite,Ur [place] NONE DETECTED NONE DETECTED   Opiate, Ur Screen NONE DETECTED NONE DETECTED   Phencyclidine (PCP) Ur S NONE DETECTED NONE DETECTED   Cannabinoid 50 Ng, Ur [place] NONE DETECTED NONE DETECTED   Barbiturates, Ur Screen NONE DETECTED NONE DETECTED   Benzodiazepine, Ur Scrn NONE DETECTED NONE DETECTED   Methadone Scn, Ur NONE DETECTED NONE DETECTED    Comment: (NOTE) 564  Tricyclics, urine               Cutoff 1000 ng/mL 200  Amphetamines, urine  Cutoff 1000 ng/mL 300  MDMA (Ecstasy), urine           Cutoff 500 ng/mL 400  Cocaine Metabolite, urine       Cutoff 300 ng/mL 500  Opiate, urine                    Cutoff 300 ng/mL 600  Phencyclidine (PCP), urine      Cutoff 25 ng/mL 700  Cannabinoid, urine              Cutoff 50 ng/mL 800  Barbiturates, urine             Cutoff 200 ng/mL 900  Benzodiazepine, urine           Cutoff 200 ng/mL 1000 Methadone, urine                Cutoff 300 ng/mL 1100 1200 The urine drug screen provides only a preliminary, unconfirmed 1300 analytical test result and should not be used for non-medical 1400 purposes. Clinical consideration and professional judgment should 1500 be applied to any positive drug screen result due to possible 1600 interfering substances. A more specific alternate chemical method 1700 must be used in order to obtain a confirmed analytical result.  1800 Gas chromato graphy / mass spectrometry (GC/MS) is the preferred 1900 confirmatory method.   Urinalysis complete, with microscopic     Status: Abnormal   Collection Time: 08/19/15  1:58 AM  Result Value Ref Range   Color, Urine YELLOW (A) YELLOW   APPearance CLEAR (A) CLEAR   Glucose, UA NEGATIVE NEGATIVE mg/dL   Bilirubin Urine NEGATIVE NEGATIVE   Ketones, ur NEGATIVE NEGATIVE mg/dL   Specific Gravity, Urine 1.008 1.005 - 1.030   Hgb urine dipstick NEGATIVE NEGATIVE   pH 6.0 5.0 - 8.0   Protein, ur NEGATIVE NEGATIVE mg/dL   Nitrite NEGATIVE NEGATIVE   Leukocytes, UA NEGATIVE NEGATIVE   RBC / HPF 0-5 0 - 5 RBC/hpf   WBC, UA 0-5 0 - 5 WBC/hpf   Bacteria, UA RARE (A) NONE SEEN   Squamous Epithelial / LPF 0-5 (A) NONE SEEN    Current Facility-Administered Medications  Medication Dose Route Frequency Provider Last Rate Last Dose  . acetaminophen (TYLENOL) 500 MG tablet           . acetaminophen (TYLENOL) tablet 1,000 mg  1,000 mg Oral Once Daymon Larsen, MD   1,000 mg at 08/19/15 9528   Current Outpatient Prescriptions  Medication Sig Dispense Refill  . docusate sodium (COLACE) 50 MG capsule Take 50 mg by mouth 2 (two) times daily as needed for mild constipation.    Marland Kitchen  ibuprofen (ADVIL,MOTRIN) 200 MG tablet Take 400 mg by mouth every 6 (six) hours as needed for fever, headache or mild pain.    Marland Kitchen imipramine (TOFRANIL) 50 MG tablet Take 1 tablet (50 mg total) by mouth at bedtime. 30 tablet 5  . pentosan polysulfate (ELMIRON) 100 MG capsule Take 100 mg by mouth 3 (three) times daily.    . pregabalin (LYRICA) 100 MG capsule Take 100 mg by mouth 3 (three) times daily.    . clobetasol cream (TEMOVATE) 4.13 % Apply 1 application topically 2 (two) times daily. (Patient not taking: Reported on 08/19/2015) 30 g 0  . fluconazole (DIFLUCAN) 150 MG tablet Take 1 tablet (150 mg total) by mouth once. (Patient not taking: Reported on 08/19/2015) 1 tablet 0  . hydrocortisone (ANUSOL-HC) 25 MG suppository Place 1 suppository (25 mg total)  rectally 2 (two) times daily. (Patient not taking: Reported on 08/19/2015) 12 suppository 0  . nitrofurantoin, macrocrystal-monohydrate, (MACROBID) 100 MG capsule Take 1 capsule (100 mg total) by mouth daily. (Patient not taking: Reported on 08/19/2015) 30 capsule 2    Musculoskeletal: Strength & Muscle Tone: within normal limits Gait & Station: normal Patient leans: N/A  Psychiatric Specialty Exam: Review of Systems  Psychiatric/Behavioral: Positive for depression. The patient is nervous/anxious.     Blood pressure 108/74, pulse 87, temperature 97.7 F (36.5 C), temperature source Oral, resp. rate 20, height _0  (1.702 m), weight 135 lb (61.236 kg), SpO2 100 %.Body mass index is 21.14 kg/(m^2).  General Appearance: Casual and Fairly Groomed  Engineer, water::  Fair  Speech:  Normal Rate and Slow  Volume:  Normal  Mood:  Anxious  Affect:  Appropriate and Congruent  Thought Process:  Coherent and Goal Directed  Orientation:  Full (Time, Place, and Person)  Thought Content:  WDL  Suicidal Thoughts:  No  Homicidal Thoughts:  No  Memory:  Immediate;   Fair  Judgement:  Fair  Insight:  Fair  Psychomotor Activity:  Normal  Concentration:   Fair  Recall:  [employer] [place]  Language: Fair  Akathisia:  No  Handed:  Right  AIMS (if indicated):     Assets:  Communication Skills Desire for Improvement Physical Health Social Support Talents/Skills  ADL's:  Intact  Cognition: WNL  Sleep:      Treatment Plan Summary: Plan Patient will be discharged  Disposition: Patient does not meet criteria for psychiatric inpatient admission. Discussed crisis plan, support from social network, calling 911, coming to the Emergency Department, and calling Suicide Hotline.   Patient will be discharged as she does not meet the criteria for the involuntary commitment.  Her husband also agreed for her discharge at this time. She will follow-up with the outpatient psychiatrist. No new medications will be started at this time.  Rainey Pines, MD  08/19/2015 11:57 AM

## 2015-08-19 NOTE — ED Notes (Signed)

## 2015-08-19 NOTE — Discharge Instructions (Signed)
Suicidal Feelings: How to Help Yourself °Suicide is the taking of one's own life. If you feel as though life is getting too tough to handle and are thinking about suicide, get help right away. To get help: °· Call your local emergency services (911 in the [place] °· Call a suicide hotline to speak with a trained counselor who understands how you are feeling. The following is a list of suicide hotlines in the [place]. For a list of hotlines in [place], visit www.suicide.org/hotlines/international/[place]-suicide-hotlines.html. °¨  1-800-273-TALK (1-800-273-8255). °¨  1-800-SUICIDE (1-800-784-2433). °¨  1-888-628-9454. This is a hotline for Spanish speakers. °¨  1-800-799-4TTY (1-800-799-4889). This is a hotline for TTY users. °¨  1-866-4-U-TREVOR (1-866-488-7386). This is a hotline for lesbian, gay, bisexual, transgender, or questioning youth. °· Contact a crisis center or a local suicide prevention center. To find a crisis center or suicide prevention center: °¨ Call your local hospital, clinic, community service organization, mental health center, social service provider, or health department. Ask for assistance in connecting to a crisis center. °¨ Visit www.suicidepreventionlifeline.org/getinvolved/locator for a list of crisis centers in the [place], or visit www.suicideprevention.ca/thinking-about-suicide/find-a-crisis-centre for a list of centers in [place]. °· Visit the following websites: °¨  National Suicide Prevention Lifeline: www.suicidepreventionlifeline.org °¨  Hopeline: www.hopeline.com °¨  American Foundation for Suicide Prevention: www.afsp.org °¨  The Trevor Project (for lesbian, gay, bisexual, transgender, or questioning youth): www.thetrevorproject.org °HOW CAN I HELP MYSELF FEEL BETTER? °· Promise yourself that you will not do anything drastic when you have suicidal feelings. Remember, there is hope. Many people have gotten through suicidal thoughts and feelings, and you will, too. You may  have gotten through them before, and this proves that you can get through them again. °· Let family, friends, teachers, or counselors know how you are feeling. Try not to isolate yourself from those who care about you. Remember, they will want to help you. Talk with someone every day, even if you do not feel sociable. Face-to-face conversation is best. °· Call a mental health professional and see one regularly. °· Visit your primary health care provider every year. °· Eat a well-balanced diet, and space your meals so you eat regularly. °· Get plenty of rest. °· Avoid alcohol and drugs, and remove them from your home. They will only make you feel worse. °· If you are thinking of taking a lot of medicine, give your medicine to someone who can give it to you one day at a time. If you are on antidepressants and are concerned you will overdose, let your health care provider know so he or she can give you safer medicines. Ask your mental health professional about the possible side effects of any medicines you are taking. °· Remove weapons, poisons, knives, and anything else that could harm you from your home. °· Try to stick to routines. Follow a schedule every day. Put self-care on your schedule. °· Make a list of realistic goals, and cross them off when you achieve them. Accomplishments give a sense of worth. °· Wait until you are feeling better before doing the things you find difficult or unpleasant. °· Exercise if you are able. You will feel better if you exercise for even a half hour each day. °· Go out in the sun or into nature. This will help you recover from depression faster. If you have a favorite place to walk, go there. °· Do the things that have always given you pleasure. Play your favorite music, read a good book, paint a picture, play your favorite instrument, or do anything   else that takes your mind off your depression if it is safe to do. °· Keep your living space well lit. °· When you are feeling well,  write yourself a letter about tips and support that you can read when you are not feeling well. °· Remember that life's difficulties can be sorted out with help. Conditions can be treated. You can work on thoughts and strategies that serve you well. °  °This information is not intended to replace advice given to you by your health care provider. Make sure you discuss any questions you have with your health care provider. °  °Document Released: 01/25/2003 Document Revised: 08/11/2014 Document Reviewed: 11/15/2013 °Elsevier Interactive Patient Education ©2016 Elsevier Inc. ° °

## 2015-08-19 NOTE — ED Notes (Signed)
Pt's husband waiting to lobby to bring pt home.
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
## 2015-08-19 NOTE — ED Notes (Signed)
BEHAVIORAL HEALTH ROUNDING  Patient sleeping: No.  Patient alert and oriented: yes  Behavior appropriate: Yes. ; If no, describe:  Nutrition and fluids offered: Yes  Toileting and hygiene offered: Yes  Sitter present: not applicable, Q 15 min safety rounds and observation.  Law enforcement present: Yes ODS  
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
## 2015-08-19 NOTE — ED Notes (Signed)
Psychiatrist at bedside
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
## 2015-08-19 NOTE — ED Notes (Addendum)
Pt denies SI/HI/AVH. Pain 5/10. 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 "I sit on ice 24/7." Pt stated tylenol or other medications do not help her pain.  Pt cooperative, calm.  "When will the doctor be here? Im ready to go home."  Maintained on 15 minute checks and observation by security camera for safety.

## 2015-08-19 NOTE — ED Notes (Signed)
Report called to Eric, RN in ED B[facility].  

## 2015-08-19 NOTE — ED Notes (Signed)
Pt will be discharged today. Patient resting quietly in room. No noted distress or abnormal behaviors noted. Will continue 15 minute checks and observation by security camera for safety.
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
## 2015-08-19 NOTE — ED Notes (Signed)
Pt denies SI/HI and AVH. 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 Belongings sent home with patient.

## 2015-08-24 ENCOUNTER — Telehealth: Payer: Self-pay | Admitting: Internal Medicine

## 2015-08-24 NOTE — Telephone Encounter (Signed)
[place] Patient Name: Sonia Smith DOB: 1981-05-26 Initial Comment Caller states she has a catheter and had sex with her husband and has been having pain and bleeding in vagina. She has also been using a new prescription that may be causing the irritation. Nurse Assessment Nurse: Germain Osgood RN, Opal Sidles Date/Time Eilene Ghazi Time): 08/24/2015 4:07:47 PM Confirm and document reason for call. If symptomatic, describe symptoms. You must click the next button to save text entered. ---Caller states she had intercourse this week. Developed pain in vulva area and and urethra. Stopped her steroid cream and used hydrocortisone cream to vulva which helped her symptoms. Is having increasing pain in her urethra, no blood since first day Is taking Azo tablets and did use her Macrobid after intercourse. Does in out catheterizations for prevention of strictures. Has taken 3 additional Macrobid to treat her urine symptoms after performing home Dip Stick showing large leukocytes. No fever, abdominal pain or back pain Has the patient traveled out of the country within the last 30 days? ---No Does the patient have any new or worsening symptoms? ---Yes Will a triage be completed? ---Yes Related visit to physician within the last 2 weeks? ---No Does the PT have any chronic conditions? (i.e. diabetes, asthma, etc.) ---Yes List chronic conditions. ---Urethral strictures, scoliosis Did the patient indicate they were pregnant? ---No Is this a behavioral health or substance abuse call? ---No Guidelines Guideline Title Affirmed Question Affirmed Notes Urination Pain - Female [1] SEVERE pain with urination (e.g., excruciating) AND [2] not improved after 2 hours of pain medicine and Sitz bath Final Disposition User See Physician within 4 Hours (or PCP triage) Germain Osgood, RN, Jane Comments EKG's on heart, BP has elevated  resting pulse rate 107 Caller reports she will go to [facility] tomorrow A.M since it is closer to her. States offie was not able to give her appointment today. Stated if feels worse will go to [facility] ED. Did try to Reach her urologist about this issue. Was ill and not able to get appoitnment for several weeks. PLEASE NOTE: All timestamps contained within this report are represented as [place] Standard Time. CONFIDENTIALTY NOTICE: This fax transmission is intended only for the addressee. It contains information that is legally privileged, confidential or otherwise protected from use or disclosure. If you are not the intended recipient, you are strictly prohibited from reviewing, disclosing, copying using or disseminating any of this information or taking any action in reliance on or regarding this information. If you have received this fax in error, please notify us immediately by telephone so that we can arrange for its return to [place]. Phone: [phone], Toll-Free: [phone], Fax: [phone] Page: 2 of 2 Call Id: [id number] Referrals GO TO FACILITY OTHER - SPECIFY Disagree/Comply: Disagree Disagree/Comply Reason: Disagree with instructions

## 2015-08-24 NOTE — Telephone Encounter (Signed)
Spoke with pt she cannot be seen today because she does not have anyone to watch her children; pt will go to UC near her home on Sat morning 08/25/15 and pt has appt to see GYN next week. Pt will cb if needed.

## 2015-08-27 NOTE — Telephone Encounter (Signed)
Noted, agree with plan.

## 2015-08-29 ENCOUNTER — Encounter: Payer: Self-pay | Admitting: Obstetrics and Gynecology

## 2015-08-29 ENCOUNTER — Ambulatory Visit (INDEPENDENT_AMBULATORY_CARE_PROVIDER_SITE_OTHER): Payer: 59 | Admitting: Obstetrics and Gynecology

## 2015-08-29 VITALS — BP 104/82 | HR 112 | Ht 67.0 in | Wt 130.8 lb

## 2015-08-29 DIAGNOSIS — Z113 Encounter for screening for infections with a predominantly sexual mode of transmission: Secondary | ICD-10-CM | POA: Diagnosis not present

## 2015-08-29 DIAGNOSIS — R102 Pelvic and perineal pain: Secondary | ICD-10-CM | POA: Diagnosis not present

## 2015-08-29 MED ORDER — ACYCLOVIR 800 MG PO TABS: 800.0000 mg | ORAL_TABLET | Freq: Every morning | ORAL | Status: DC

## 2015-08-29 NOTE — Progress Notes (Signed)
Subjective:     Patient ID: Sonia Smith, female   DOB: [birth year], 34 y.o.   MRN: [id number]  HPI  Long-standing h/o pain at introitus with intercourse and burning intermittent outside of intercourse.  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    Objective:   Physical Exam A&O x4 Well groomed thin female in no distress Pelvic exam: VULVA: vulvar tenderness & erythema & hyperpigmentation at fold of inner labia with errosion noted at cleft/junction, VAGINA: normal appearing vagina with normal color and discharge, no lesions, CERVIX: normal appearing cervix without discharge or lesions.    Assessment:     STD screening vulvadynia with dysparenia H/o HSV     Plan:     Acyclovir 800mg  daily for suspicion that erosion may be from reoccurance of that Labs obatined To apply the following bid prn: Coconut oil, mixed with 10 drops of Lavendar oil and vit E oil.  Kellsey Sansone [place], CNM

## 2015-08-30 LAB — HEPATITIS PANEL, ACUTE
Hep A IgM: NEGATIVE
Hep B C IgM: NEGATIVE
Hep C Virus Ab: <0.1 s/co ratio (ref 0.0–0.9)
Hepatitis B Surface Ag: NEGATIVE

## 2015-08-30 LAB — HSV(HERPES SIMPLEX VRS) I + II AB-IGG
HSV 1 Glycoprotein G Ab, IgG: 28.3 index — ABNORMAL HIGH (ref 0.00–0.90)
HSV 2 Glycoprotein G Ab, IgG: 14 index — ABNORMAL HIGH (ref 0.00–0.90)

## 2015-08-30 LAB — HIV ANTIBODY (ROUTINE TESTING W REFLEX): HIV Screen 4th Generation wRfx: NONREACTIVE

## 2015-08-30 LAB — RPR: RPR Ser Ql: NONREACTIVE

## 2015-08-31 ENCOUNTER — Encounter: Payer: Self-pay | Admitting: Internal Medicine

## 2015-09-01 LAB — NUSWAB VAGINITIS PLUS (VG+)
Candida albicans, NAA: NEGATIVE
Candida glabrata, NAA: NEGATIVE
Chlamydia trachomatis, NAA: NEGATIVE
Neisseria gonorrhoeae, NAA: NEGATIVE
Trich vag by NAA: NEGATIVE

## 2015-09-14 ENCOUNTER — Ambulatory Visit (INDEPENDENT_AMBULATORY_CARE_PROVIDER_SITE_OTHER): Payer: 59 | Admitting: Internal Medicine

## 2015-09-14 ENCOUNTER — Encounter: Payer: Self-pay | Admitting: Internal Medicine

## 2015-09-14 VITALS — BP 106/78 | HR 97 | Temp 97.9°F | Wt 128.5 lb

## 2015-09-14 DIAGNOSIS — R300 Dysuria: Secondary | ICD-10-CM | POA: Diagnosis not present

## 2015-09-14 DIAGNOSIS — N898 Other specified noninflammatory disorders of vagina: Secondary | ICD-10-CM | POA: Diagnosis not present

## 2015-09-14 DIAGNOSIS — B373 Candidiasis of vulva and vagina: Secondary | ICD-10-CM

## 2015-09-14 DIAGNOSIS — B3731 Acute candidiasis of vulva and vagina: Secondary | ICD-10-CM

## 2015-09-14 LAB — POC URINALSYSI DIPSTICK (AUTOMATED)
Bilirubin, UA: NEGATIVE
Blood, UA: NEGATIVE
Glucose, UA: NEGATIVE
Ketones, UA: NEGATIVE
Leukocytes, UA: NEGATIVE
Nitrite, UA: NEGATIVE
Protein, UA: NEGATIVE
Spec Grav, UA: 1.01
Urobilinogen, UA: NEGATIVE
pH, UA: 6

## 2015-09-14 LAB — FOLLICLE STIMULATING HORMONE: FSH: 8.8 m[IU]/mL

## 2015-09-14 LAB — LUTEINIZING HORMONE: LH: 16.9 m[IU]/mL

## 2015-09-14 MED ORDER — FLUCONAZOLE 150 MG PO TABS: 150.0000 mg | ORAL_TABLET | Freq: Once | ORAL | Status: DC

## 2015-09-14 NOTE — Progress Notes (Signed)
Pre visit review using our clinic review tool, if applicable. No additional management support is needed unless otherwise documented below in the visit note. 

## 2015-09-14 NOTE — Addendum Note (Signed)
Addended by: Lurlean Nanny on: 09/14/2015 04:12 PM   Modules accepted: Orders

## 2015-09-14 NOTE — Progress Notes (Signed)
HPI  Pt presents to the clinic today with c/o dysuria. She reports she had a urethral dialatin 3-4 weeks ago. She was subsequently seen 08/25/15 for similar symptoms and given Bactrim for a UTI. When the urine culture came back, they changed her to Cipro. She completed her antibiotics and reports that the dysuria started up again 4 -5 days ago. She denies fever, chills, nausea or low back pain. She has had a slight vaginal discharge. The discharge is thin and white. She denies odor. She reports vaginal irritation but no itching. She does have a history of chronic ureteral pain. She would like her hormone levels checked today as well.   Review of Systems  Past Medical History  Diagnosis Date  . Childhood asthma   . Blood in stool   . Depression   . Migraines   . GERD (gastroesophageal reflux disease)   . Vaginal Pap smear, abnormal   . Lupus ([place])   . Painful intercourse     Family History  Problem Relation Age of Onset  . Diabetes Mother   . Hypertension Mother   . Cancer Father     lung  . Diabetes Father   . Hypertension Father   . Heart disease Maternal Grandmother   . Heart disease Maternal Grandfather   . Cancer Paternal Grandfather   . Stroke Neg Hx     Social History   Social History  . Marital Status: Married    Spouse Name: N/A  . Number of Children: N/A  . Years of Education: N/A   Occupational History  . Not on file.   Social History Main Topics  . Smoking status: Former Research scientist (life sciences)  . Smokeless tobacco: Never Used     Comment: quit 2001  . Alcohol Use: No  . Drug Use: No  . Sexual Activity: Yes    Birth Control/ Protection: Surgical   Other Topics Concern  . Not on file   Social History Narrative    Allergies  Allergen Reactions  . Estradiol Swelling and Other (See Comments)    Skin peels off    Constitutional: Denies fever, malaise, fatigue, headache or abrupt weight changes.   GU: Pt reports pain with urination and vaginal irritation. Denies  urgency, frequency, burning sensation, blood in urine, odor. Skin: Denies redness, rashes, lesions or ulcercations.   No other specific complaints in a complete review of systems (except as listed in HPI above).    Objective:   Physical Exam BP 106/78 mmHg  Pulse 97  Temp(Src) 97.9 F (36.6 C) (Oral)  Wt 128 lb 8 oz (58.287 kg)  SpO2 99%  Wt Readings from Last 3 Encounters:  09/14/15 128 lb 8 oz (58.287 kg)  08/29/15 130 lb 12.8 oz (59.33 kg)  08/19/15 135 lb (61.236 kg)    General: Appears her stated age, well developed, well nourished in NAD. Cardiovascular: Normal rate and rhythm. S1,S2 noted.   Pulmonary/Chest: Normal effort and positive vesicular breath sounds. No respiratory distress. No wheezes, rales or ronchi noted.  Abdomen: Soft. Normal bowel sounds. No distention or masses noted.  Tender to palpation over the bladder area. No CVA tenderness. Pelvic: Normal female anatomy. Labia minora swollen and red. No vaginal discharge or odor noted.      Assessment & Plan:   Dysuria, vaginal discharge:  Urinalysis: normal No need to repeat urine culture Continue Uribel Wet prep: + yeast eRx for Diflucan 150 mg PO x 1, repeat in 3 days Will check FSH/LH today Drink  plenty of fluids  RTC as needed or if symptoms persist.

## 2015-09-14 NOTE — Patient Instructions (Signed)

## 2015-09-16 ENCOUNTER — Encounter: Payer: Self-pay | Admitting: Internal Medicine

## 2015-09-17 MED ORDER — URIBEL 118 MG PO CAPS: 1.0000 | ORAL_CAPSULE | Freq: Four times a day (QID) | ORAL | Status: DC

## 2015-09-17 NOTE — Telephone Encounter (Signed)
Please advise if okay to refill. 

## 2015-10-02 ENCOUNTER — Ambulatory Visit: Payer: 59 | Admitting: Internal Medicine

## 2015-10-04 ENCOUNTER — Ambulatory Visit: Payer: Self-pay | Admitting: Obstetrics and Gynecology

## 2015-10-08 ENCOUNTER — Encounter
Admission: RE | Admit: 2015-10-08 | Discharge: 2015-10-08 | Disposition: A | Discharge status: Home / Self Care | Payer: BLUE CROSS/BLUE SHIELD | Source: Ambulatory Visit | Attending: Surgery | Admitting: Surgery

## 2015-10-08 DIAGNOSIS — Z01812 Encounter for preprocedural laboratory examination: Secondary | ICD-10-CM | POA: Insufficient documentation

## 2015-10-08 NOTE — Pre-Procedure Instructions (Signed)
Spoke with Dr. Rosey Bath regarding pt's prior EKG's from Sept. 29, 2015 at [facility] and the one done in ER on 08/19/15.  Pt has a history of childhood asthma and urological issues, otherwise healthy, can walk a mile without difficulty.  Dr. Rosey Bath is OK to proceed.

## 2015-10-08 NOTE — Pre-Procedure Instructions (Signed)
Asked pt about abnormal EKG from January 2017.  Pt reports she had an abnormal EKG done at [facility] in 2015 prior to a urology surgery done there, that the EKG was worse at that time, pt was reassured that EKG results OK for a pt a women her age and surgery was proceeded with.  This RN spoke with Judeen Hammans Dr. Lavone Neri Smith's nurse, she was able to look up pt's EKG from [facility] and will fax the tracing to PAT for pt's chart.   Dr. Tamala Julian will write a new H+P the am of surgery.

## 2015-10-08 NOTE — Patient Instructions (Signed)
  Your procedure is scheduled on: Friday March 31 , 2017. Report to Same Day Surgery. To find out your arrival time please call [phone] between 1PM - 3PM on Thursday November 01, 2015.  Remember: Instructions that are not followed completely may result in serious medical risk, up to and including death, or upon the discretion of your surgeon and anesthesiologist your surgery may need to be rescheduled.    _x___ 1. Do not eat food or drink liquids after midnight. No gum chewing or  hard candies.     ____ 2. No Alcohol for 24 hours before or after surgery.   ____ 3. Bring all medications with you on the day of surgery if instructed.    __x__ 4. Notify your doctor if there is any change in your medical condition     (cold, fever, infections).     Do not wear jewelry, make-up, hairpins, clips or nail polish.  Do not wear lotions, powders, or perfumes. You may wear deodorant.  Do not shave 48 hours prior to surgery. Men may shave face and neck.  Do not bring valuables to the hospital.    [facility] is not responsible for any belongings or valuables.               Contacts, dentures or bridgework may not be worn into surgery.  Leave your suitcase in the car. After surgery it may be brought to your room.  For patients admitted to the hospital, discharge time is determined by your treatment team.   Patients discharged the day of surgery will not be allowed to drive home.    Please read over the following fact sheets that you were given:   [facility] Preparing for Surgery  ____ Take these medicines the morning of surgery with A SIP OF WATER: None    ____ Fleet Enema (as directed)   ____ Use CHG Soap as directed  ____ Use inhalers on the day of surgery  ____ Stop metformin 2 days prior to surgery    ____ Take 1/2 of usual insulin dose the night before surgery and none on the morning of  surgery.   ____ Stop Coumadin/Plavix/aspirin on   __x__ Stop Anti-inflammatories such as  Ibuprofen, Advil, aleve, Motrin & CYSTEX on 10/26/15. Tylenol OK for pain.   ____ Stop supplements until after surgery.    ____ Bring C-Pap to the hospital.

## 2015-10-11 ENCOUNTER — Ambulatory Visit: Payer: Self-pay | Admitting: Obstetrics and Gynecology

## 2015-10-12 ENCOUNTER — Encounter: Payer: Self-pay | Admitting: Obstetrics and Gynecology

## 2015-10-12 ENCOUNTER — Telehealth: Payer: Self-pay | Admitting: Internal Medicine

## 2015-10-12 ENCOUNTER — Ambulatory Visit: Payer: 59 | Admitting: Physical Therapy

## 2015-10-12 ENCOUNTER — Ambulatory Visit (INDEPENDENT_AMBULATORY_CARE_PROVIDER_SITE_OTHER): Payer: 59 | Admitting: Obstetrics and Gynecology

## 2015-10-12 VITALS — BP 125/84 | HR 114 | Ht 66.5 in | Wt 129.4 lb

## 2015-10-12 DIAGNOSIS — B379 Candidiasis, unspecified: Secondary | ICD-10-CM | POA: Diagnosis not present

## 2015-10-12 DIAGNOSIS — R102 Pelvic and perineal pain: Secondary | ICD-10-CM | POA: Diagnosis not present

## 2015-10-12 DIAGNOSIS — R52 Pain, unspecified: Secondary | ICD-10-CM | POA: Diagnosis not present

## 2015-10-12 LAB — POCT URINALYSIS DIPSTICK
Bilirubin, UA: NEGATIVE
Blood, UA: NEGATIVE
Glucose, UA: NEGATIVE
Ketones, UA: NEGATIVE
Leukocytes, UA: NEGATIVE
Nitrite, UA: NEGATIVE
Spec Grav, UA: 1.01
Urobilinogen, UA: 0.2
pH, UA: 6

## 2015-10-12 MED ORDER — FLUCONAZOLE 150 MG PO TABS: 150.0000 mg | ORAL_TABLET | Freq: Once | ORAL | Status: DC

## 2015-10-12 MED ORDER — LIDOCAINE HCL 2 % EX GEL: 1.0000 "application " | CUTANEOUS | Status: DC | PRN

## 2015-10-12 MED ORDER — HYDROXYZINE PAMOATE 100 MG PO CAPS: 100.0000 mg | ORAL_CAPSULE | Freq: Three times a day (TID) | ORAL | Status: DC | PRN

## 2015-10-12 MED ORDER — TRAZODONE HCL 100 MG PO TABS: 100.0000 mg | ORAL_TABLET | Freq: Every day | ORAL | Status: DC

## 2015-10-12 NOTE — Telephone Encounter (Signed)
That is fine but I won't be here next week--perhaps she needs someone sooner

## 2015-10-12 NOTE — Telephone Encounter (Signed)
Pt called wanting to make an appointment to see dr Silvio Pate for bladder pain.  She stating her mom Elmon Else [birth year]08/59) for bladder pain. She stating she doesn't want to switch provder she just wants to see dr Silvio Pate for bladder pain Is it ok to scheule

## 2015-10-12 NOTE — Progress Notes (Signed)
Subjective:     Patient ID: Sonia Smith, female   DOB: 10-Aug-1980, 35 y.o.   MRN: [id number]  HPI See previous notes, long standing h/o chronic pelvic and vulvar pain.  Review of Systems Reports recently treated for yeast infections and vulvar pain by PCP, with diflucan and clobetesol cream- two weeks ago had severe burning after applying coconut oil for dryness.      Objective:   Physical Exam A&O x4  Well groomed female with depressed affect.  Pelvic exam: normal external genitalia, vulva, vagina, cervix, uterus and adnexa, WET MOUNT done - results: negative for pathogens, normal epithelial cells, lactobacilli, pH 4.5.    Assessment:     Mild yeast infection Sleep disturbance secondary to vulvar pain Vulvar and vaginal pain of unknown etiology     Plan:     RX for diflucan, lidocaine gel and visteril and trazadone sent in. Instructed in use.  Rhyan Radler [place], CNM

## 2015-10-12 NOTE — Telephone Encounter (Signed)
noted 

## 2015-10-12 NOTE — Telephone Encounter (Signed)
Patient scheduled appointment on 10/22/15 at 3:30.

## 2015-10-18 ENCOUNTER — Ambulatory Visit: Payer: Self-pay | Admitting: Obstetrics and Gynecology

## 2015-10-22 ENCOUNTER — Encounter: Payer: Self-pay | Admitting: Internal Medicine

## 2015-10-22 ENCOUNTER — Ambulatory Visit (INDEPENDENT_AMBULATORY_CARE_PROVIDER_SITE_OTHER): Payer: BLUE CROSS/BLUE SHIELD | Admitting: Internal Medicine

## 2015-10-22 VITALS — BP 110/70 | HR 100 | Temp 97.2°F | Wt 134.0 lb

## 2015-10-22 DIAGNOSIS — G8929 Other chronic pain: Secondary | ICD-10-CM

## 2015-10-22 DIAGNOSIS — R102 Pelvic and perineal pain: Principal | ICD-10-CM

## 2015-10-22 DIAGNOSIS — N949 Unspecified condition associated with female genital organs and menstrual cycle: Secondary | ICD-10-CM | POA: Diagnosis not present

## 2015-10-22 MED ORDER — IMIPRAMINE HCL 25 MG PO TABS: 25.0000 mg | ORAL_TABLET | Freq: Three times a day (TID) | ORAL | Status: DC

## 2015-10-22 NOTE — Progress Notes (Signed)
Pre visit review using our clinic review tool, if applicable. No additional management support is needed unless otherwise documented below in the visit note. 

## 2015-10-22 NOTE — Assessment & Plan Note (Signed)
Seems radicular ?from herpes without ever having an outbreak? Related to past UTI? No clear diagnosis but does seem nerve related Will try to increase the imipramine--- wean off the gabapentin Going to see another neurologist--Dr Amalia Hailey at [facility]. Will defer other trials of nerve pain relief to him c

## 2015-10-22 NOTE — Progress Notes (Signed)
Subjective:    Patient ID: Sonia Smith, female    DOB: November 10, 1980, 35 y.o.   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 acyclovir (ZOVIRAX) 800 MG tablet Take 1 tablet (800 mg total) by mouth every morning. 90 tablet 4  . docusate sodium (COLACE) 50 MG capsule Take 50 mg by mouth 2 (two) times daily as needed for mild constipation.    . hydrocortisone (ANUSOL-HC) 25 MG suppository Place 1 suppository (25 mg total) rectally 2 (two) times daily. 12 suppository 0  . hydrOXYzine (VISTARIL) 100 MG capsule Take 1 capsule (100 mg total) by mouth 3 (three) times daily as needed for itching. 30 capsule 0  . ibuprofen (ADVIL,MOTRIN) 200 MG tablet Take 400 mg by mouth every 6 (six) hours as needed for fever, headache or mild pain.    Marland Kitchen imipramine (TOFRANIL) 50 MG tablet Take 1 tablet (50 mg total) by mouth at bedtime. (Patient taking differently: Take 50 mg by mouth at bedtime. 25-50 mg at bedtime.) 30 tablet 5  . lidocaine (XYLOCAINE) 2 % jelly Apply 1 application topically as needed. 30 mL 2  . Methenamine-Sodium Salicylate (CYSTEX) XX123456 MG TABS Take 2 tablets by mouth as needed. Reported on 10/12/2015    . nitrofurantoin, macrocrystal-monohydrate, (MACROBID) 100 MG capsule Take 100 mg by mouth once. Reported on  10/12/2015    . pentosan polysulfate (ELMIRON) 100 MG capsule Take 100 mg by mouth 3 (three) times daily.    . phenazopyridine (PYRIDIUM) 97 MG tablet Take 97 mg by mouth 3 (three) times daily as needed for pain.     No current facility-administered medications on file prior to visit.    Allergies  Allergen Reactions  . Estradiol Swelling and Other (See Comments)    Skin peels off  . Adhesive [Tape] Rash    From bandaides. Tape is OK.    Past Medical History  Diagnosis Date  . Blood in stool   . Migraines   . GERD (gastroesophageal reflux disease)   . Vaginal Pap smear, abnormal   . Lupus ([place])     tested positive many years ago, most recent came back negative.  . Painful intercourse   . Childhood asthma     pt has out grown  . Depression     in high school    Past Surgical History  Procedure Laterality Date  . Abdominal hysterectomy  2008  . Leep  1998    dysplasia  . Cervical cerclage  2001, 2004  . Bladder tumor excision  2006, 05/2014    benign tumor removed x 2    Family History  Problem Relation Age of Onset  . Diabetes Mother   . Hypertension Mother   . Cancer Father     lung  . Diabetes Father   . Hypertension Father   .  Heart disease Maternal Grandmother   . Heart disease Maternal Grandfather   . Cancer Paternal Grandfather   . Stroke Neg Hx     Social History   Social History  . Marital Status: Married    Spouse Name: N/A  . Number of Children: N/A  . Years of Education: N/A   Occupational History  . Not on file.   Social History Main Topics  . Smoking status: Former Smoker    Quit date: 10/08/1999  . Smokeless tobacco: Never Used     Comment: quit 2001  . Alcohol Use: No  . Drug Use: No  . Sexual Activity: Yes    Birth Control/ Protection: Surgical   Other Topics Concern  . 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 No clear help from the acyclovir Vistaril some help with vaginal pain--uses it at night due to sedation    Objective:   Physical Exam  Psychiatric: She has a normal mood and affect. Her behavior is normal.  Very reasonable giving her history          Assessment & Plan:

## 2015-10-22 NOTE — Patient Instructions (Signed)
Please wean off the gabapentin completely. Try the imipramine 25mg  twice a day (morning and night) and then increase to three times a day if you don't have urinary retention.

## 2015-10-23 ENCOUNTER — Encounter: Payer: Self-pay | Admitting: Internal Medicine

## 2015-10-23 LAB — POC URINALSYSI DIPSTICK (AUTOMATED)
Bilirubin, UA: NEGATIVE
Blood, UA: NEGATIVE
Glucose, UA: NEGATIVE
Ketones, UA: NEGATIVE
Leukocytes, UA: NEGATIVE
Nitrite, UA: NEGATIVE
Protein, UA: NEGATIVE
Spec Grav, UA: 1.025
Urobilinogen, UA: 2
pH, UA: 6

## 2015-10-23 NOTE — Telephone Encounter (Signed)
Please call her I usually don't do this but it would be okay to give a specimen today. She should not bring one in --she should void here.  I thought she told me she was still on an antibiotic though--confirm this. It is also not unreasonable to take a single dose of antibiotic after sex to prevent infections

## 2015-10-23 NOTE — Addendum Note (Signed)
Addended by: Pilar Grammes on: 10/23/2015 11:45 AM   Modules accepted: Orders

## 2015-10-23 NOTE — Telephone Encounter (Signed)
Urinalysis was done and results went to Dr Silvio Pate.

## 2015-10-23 NOTE — Telephone Encounter (Signed)
Pt called to ck and see if could get urine test done this morning. Pt notified as instructed. Pt does take macrobid 100 mg one pill after sex. Pt last took Macrobid 100 mg on 10/20/15 after sexual encounter. Now pt has burning upon urination. Pt will come to office now to give urine specimen. FYI sent to [facility] CMA and Dr Silvio Pate.

## 2015-10-30 ENCOUNTER — Encounter: Payer: Self-pay | Admitting: Internal Medicine

## 2015-10-30 ENCOUNTER — Encounter: Payer: Self-pay | Admitting: Obstetrics and Gynecology

## 2015-11-02 ENCOUNTER — Encounter: Admission: RE | Payer: Self-pay | Source: Ambulatory Visit

## 2015-11-02 ENCOUNTER — Ambulatory Visit: Admission: RE | Admit: 2015-11-02 | Payer: BLUE CROSS/BLUE SHIELD | Source: Ambulatory Visit | Admitting: Surgery

## 2015-11-02 SURGERY — HEMORRHOIDECTOMY
Anesthesia: Choice

## 2015-11-06 ENCOUNTER — Ambulatory Visit: Payer: BLUE CROSS/BLUE SHIELD | Admitting: Anesthesiology

## 2015-11-06 ENCOUNTER — Encounter
Admission: RE | Disposition: A | Discharge status: Home / Self Care | Payer: Self-pay | Source: Ambulatory Visit | Attending: Surgery

## 2015-11-06 ENCOUNTER — Encounter: Payer: Self-pay | Admitting: *Deleted

## 2015-11-06 ENCOUNTER — Ambulatory Visit
Admission: RE | Admit: 2015-11-06 | Discharge: 2015-11-06 | Disposition: A | Discharge status: Home / Self Care | Payer: BLUE CROSS/BLUE SHIELD | Source: Ambulatory Visit | Attending: Surgery | Admitting: Surgery

## 2015-11-06 DIAGNOSIS — Z87891 Personal history of nicotine dependence: Secondary | ICD-10-CM | POA: Diagnosis not present

## 2015-11-06 DIAGNOSIS — K645 Perianal venous thrombosis: Secondary | ICD-10-CM | POA: Insufficient documentation

## 2015-11-06 DIAGNOSIS — K219 Gastro-esophageal reflux disease without esophagitis: Secondary | ICD-10-CM | POA: Insufficient documentation

## 2015-11-06 DIAGNOSIS — Z79899 Other long term (current) drug therapy: Secondary | ICD-10-CM | POA: Diagnosis not present

## 2015-11-06 DIAGNOSIS — Z825 Family history of asthma and other chronic lower respiratory diseases: Secondary | ICD-10-CM | POA: Diagnosis not present

## 2015-11-06 DIAGNOSIS — M419 Scoliosis, unspecified: Secondary | ICD-10-CM | POA: Insufficient documentation

## 2015-11-06 DIAGNOSIS — K644 Residual hemorrhoidal skin tags: Secondary | ICD-10-CM | POA: Diagnosis not present

## 2015-11-06 DIAGNOSIS — Z8249 Family history of ischemic heart disease and other diseases of the circulatory system: Secondary | ICD-10-CM | POA: Insufficient documentation

## 2015-11-06 DIAGNOSIS — K648 Other hemorrhoids: Secondary | ICD-10-CM | POA: Diagnosis not present

## 2015-11-06 DIAGNOSIS — Z833 Family history of diabetes mellitus: Secondary | ICD-10-CM | POA: Insufficient documentation

## 2015-11-06 DIAGNOSIS — K649 Unspecified hemorrhoids: Secondary | ICD-10-CM | POA: Diagnosis not present

## 2015-11-06 HISTORY — PX: HEMORRHOID SURGERY: SHX153

## 2015-11-06 SURGERY — HEMORRHOIDECTOMY
Anesthesia: General | Wound class: Clean Contaminated

## 2015-11-06 MED ORDER — LACTATED RINGERS IV SOLN
INTRAVENOUS | Status: DC
  Administered 2015-11-06 (×2): via INTRAVENOUS

## 2015-11-06 MED ORDER — OXYCODONE-ACETAMINOPHEN 5-325 MG PO TABS: 1.0000 | ORAL_TABLET | ORAL | Status: DC | PRN

## 2015-11-06 MED ORDER — HYDROMORPHONE HCL 1 MG/ML IJ SOLN
INTRAMUSCULAR | Status: DC | PRN
  Administered 2015-11-06: .5 mg via INTRAVENOUS

## 2015-11-06 MED ORDER — BUPIVACAINE-EPINEPHRINE (PF) 0.5% -1:200000 IJ SOLN
INTRAMUSCULAR | Status: AC
Start: 2015-11-06 — End: 2015-11-06
  Filled 2015-11-06: qty 30

## 2015-11-06 MED ORDER — FENTANYL CITRATE (PF) 100 MCG/2ML IJ SOLN
25.0000 ug | INTRAMUSCULAR | Status: DC | PRN
  Administered 2015-11-06 (×2): 25 ug via INTRAVENOUS

## 2015-11-06 MED ORDER — BUPIVACAINE LIPOSOME 1.3 % IJ SUSP
INTRAMUSCULAR | Status: AC
  Filled 2015-11-06: qty 20

## 2015-11-06 MED ORDER — FENTANYL CITRATE (PF) 100 MCG/2ML IJ SOLN
INTRAMUSCULAR | Status: DC | PRN
Start: 2015-11-06 — End: 2015-11-06
  Administered 2015-11-06 (×2): 25 ug via INTRAVENOUS
  Administered 2015-11-06: 50 ug via INTRAVENOUS

## 2015-11-06 MED ORDER — PROPOFOL 10 MG/ML IV BOLUS
INTRAVENOUS | Status: DC | PRN
  Administered 2015-11-06: 150 mg via INTRAVENOUS

## 2015-11-06 MED ORDER — FAMOTIDINE 20 MG PO TABS
ORAL_TABLET | ORAL | Status: AC
  Administered 2015-11-06: 20 mg via ORAL
  Filled 2015-11-06: qty 1

## 2015-11-06 MED ORDER — FENTANYL CITRATE (PF) 100 MCG/2ML IJ SOLN
INTRAMUSCULAR | Status: AC
  Administered 2015-11-06: 25 ug via INTRAVENOUS
  Filled 2015-11-06: qty 2

## 2015-11-06 MED ORDER — ONDANSETRON HCL 4 MG/2ML IJ SOLN: 4.0000 mg | Freq: Once | INTRAMUSCULAR | Status: DC | PRN

## 2015-11-06 MED ORDER — DEXAMETHASONE SODIUM PHOSPHATE 10 MG/ML IJ SOLN
INTRAMUSCULAR | Status: DC | PRN
  Administered 2015-11-06: 10 mg via INTRAVENOUS

## 2015-11-06 MED ORDER — OXYCODONE-ACETAMINOPHEN 5-325 MG PO TABS
1.0000 | ORAL_TABLET | ORAL | Status: DC | PRN
  Administered 2015-11-06: 1 via ORAL

## 2015-11-06 MED ORDER — OXYCODONE-ACETAMINOPHEN 5-325 MG PO TABS
ORAL_TABLET | ORAL | Status: AC
  Administered 2015-11-06: 1 via ORAL
  Filled 2015-11-06: qty 1

## 2015-11-06 MED ORDER — FAMOTIDINE 20 MG PO TABS
20.0000 mg | ORAL_TABLET | Freq: Once | ORAL | Status: AC
  Administered 2015-11-06: 20 mg via ORAL

## 2015-11-06 MED ORDER — BUPIVACAINE-EPINEPHRINE (PF) 0.5% -1:200000 IJ SOLN
INTRAMUSCULAR | Status: DC | PRN
  Administered 2015-11-06: 10 mL via PERINEURAL

## 2015-11-06 MED ORDER — LIDOCAINE HCL (CARDIAC) 20 MG/ML IV SOLN
INTRAVENOUS | Status: DC | PRN
  Administered 2015-11-06: 100 mg via INTRAVENOUS

## 2015-11-06 SURGICAL SUPPLY — 29 items
BLADE SURG 15 STRL LF DISP TIS (BLADE) ×1 IMPLANT
BLADE SURG 15 STRL SS (BLADE) ×1
CANISTER SUCT 1200ML W/VALVE (MISCELLANEOUS) ×2 IMPLANT
DRAPE LAPAROTOMY 100X77 ABD (DRAPES) ×2 IMPLANT
DRAPE LEGGINS SURG 28X43 STRL (DRAPES) ×2 IMPLANT
DRAPE UNDER BUTTOCK W/FLU (DRAPES) ×2 IMPLANT
ELECT REM PT RETURN 9FT ADLT (ELECTROSURGICAL) ×2
ELECTRODE REM PT RTRN 9FT ADLT (ELECTROSURGICAL) ×1 IMPLANT
GAUZE SPONGE 4X4 12PLY STRL (GAUZE/BANDAGES/DRESSINGS) ×2 IMPLANT
GLOVE BIO SURGEON STRL SZ7.5 (GLOVE) ×6 IMPLANT
GLOVE EXAM NITRILE PF MED BLUE (GLOVE) ×4 IMPLANT
GOWN STRL REUS W/ TWL LRG LVL3 (GOWN DISPOSABLE) ×2 IMPLANT
GOWN STRL REUS W/TWL LRG LVL3 (GOWN DISPOSABLE) ×2
HARMONIC SCALPEL FOCUS (MISCELLANEOUS) IMPLANT
LABEL OR SOLS (LABEL) ×2 IMPLANT
NEEDLE HYPO 25X1 1.5 SAFETY (NEEDLE) ×2 IMPLANT
NS IRRIG 500ML POUR BTL (IV SOLUTION) ×2 IMPLANT
PACK BASIN MINOR ARMC (MISCELLANEOUS) ×2 IMPLANT
PAD ABD DERMACEA PRESS 5X9 (GAUZE/BANDAGES/DRESSINGS) ×2 IMPLANT
PAD PREP 24X41 OB/GYN DISP (PERSONAL CARE ITEMS) ×2 IMPLANT
SOL PREP PVP 2OZ (MISCELLANEOUS) ×2
SOLUTION PREP PVP 2OZ (MISCELLANEOUS) ×1 IMPLANT
STAPLER PROXIMATE HCS (STAPLE) IMPLANT
STRAP SAFETY BODY (MISCELLANEOUS) ×2 IMPLANT
SURGILUBE 2OZ TUBE FLIPTOP (MISCELLANEOUS) ×2 IMPLANT
SUT CHROMIC 2 0 SH (SUTURE) ×2 IMPLANT
SUT CHROMIC 3 0 SH 27 (SUTURE) ×4 IMPLANT
SUT PROLENE 3 0 PS 2 (SUTURE) IMPLANT
SYRINGE 10CC LL (SYRINGE) ×2 IMPLANT

## 2015-11-06 NOTE — Anesthesia Preprocedure Evaluation (Signed)
Anesthesia Evaluation  Patient identified by MRN, date of birth, ID band Patient awake    Reviewed: Allergy & Precautions, NPO status , Patient's Chart, lab work & pertinent test results  History of Anesthesia Complications Negative for: history of anesthetic complications  Airway Mallampati: I       Dental  (+) Implants   Pulmonary neg pulmonary ROS, asthma (as a child) , former smoker,           Cardiovascular negative cardio ROS       Neuro/Psych Seizures - (febrile at 35 yo),  Depression    GI/Hepatic Neg liver ROS, GERD  Controlled,  Endo/Other  negative endocrine ROS  Renal/GU negative Renal ROS     Musculoskeletal   Abdominal   Peds  Hematology negative hematology ROS (+)   Anesthesia Other Findings   Reproductive/Obstetrics                             Anesthesia Physical Anesthesia Plan  ASA: II  Anesthesia Plan: General   Post-op Pain Management:    Induction: Intravenous  Airway Management Planned: LMA and Oral ETT  Additional Equipment:   Intra-op Plan:   Post-operative Plan:   Informed Consent: I have reviewed the patients History and Physical, chart, labs and discussed the procedure including the risks, benefits and alternatives for the proposed anesthesia with the patient or authorized representative who has indicated his/her understanding and acceptance.     Plan Discussed with:   Anesthesia Plan Comments:         Anesthesia Quick Evaluation

## 2015-11-06 NOTE — Op Note (Signed)
OPERATIVE REPORT  PREOPERATIVE  DIAGNOSIS: . Hemorrhoids  POSTOPERATIVE DIAGNOSIS: . Hemorrhoids  PROCEDURE: . Hemorrhoidectomy  ANESTHESIA:  General  SURGEON: Rochel Brome  MD   INDICATIONS: . She has a history of multiple episodes of thrombosed external hemorrhoids. She does continue to have some moderate pain related to hemorrhoids. She does have occasional bleeding. She had physical findings of internal and external hemorrhoids. Surgery was recommended for definitive treatment.  With the patient on the operating table in the supine position the legs were elevated into the lithotomy position using ankle straps. The anal area was prepared with Betadine solution and draped in sterile towels and sheets  Initial inspection revealed the largest hemorrhoid at the 7:00 position. The next largest hemorrhoid at the 11:00 position and the next largest hemorrhoid at the 4:00 position. The anoderm surrounding these hemorrhoids was infiltrated with half percent Sensorcaine with epinephrine. The anal canal was dilated. The bivalve anal retractor was introduced. Examination of the rectal mucosa revealed no polyps or tumors. Internal hemorrhoids were noted.  At the 7:00 to 8:00 position the internal component was suture ligated with 3-0 chromic. A V-shaped incision was made externally with a scalpel. Electrocautery was used to dissect the external hemorrhoid away from the underlying tissues dissection was carried out up over the internal anal sphincter which was identified and carried up to the previously placed suture ligature. The hemorrhoid was further ligated and then excised. Hemostasis was intact. The wound was closed with a running locked tied 3-0 chromic stitch leaving a small opening externally for drainage.  A similar procedure was carried out at the 11:00 position with a similar ligation and excision and repair.  A similar procedure was carried out at the 4:00 position with a similar ligation  and excision and repair.  Following this the anoderm was again prepared with Betadine solution. The subcutaneous tissues and deeper tissues surrounding the sphincter were infiltrated with 20 cc of Exparel. Dressings were applied with paper tape  The patient appear to be in satisfactory condition and was then prepared for transfer to the recovery room  [place].D.

## 2015-11-06 NOTE — Anesthesia Postprocedure Evaluation (Signed)
Anesthesia Post Note  Patient: Sonia Smith  Procedure(s) Performed: Procedure(s) (LRB): HEMORRHOIDECTOMY (N/A)  Patient location during evaluation: PACU Anesthesia Type: General Level of consciousness: awake and alert Pain management: pain level controlled Vital Signs Assessment: post-procedure vital signs reviewed and stable Respiratory status: spontaneous breathing and respiratory function stable Cardiovascular status: stable Anesthetic complications: no    Last Vitals:  Filed Vitals:   11/06/15 1211 11/06/15 1338  BP: 117/82 107/66  Pulse: 72 93  Temp:    Resp: 14 14    Last Pain:  Filed Vitals:   11/06/15 1339  PainSc: 7                  Julaine Zimny K

## 2015-11-06 NOTE — Transfer of Care (Signed)
Immediate Anesthesia Transfer of Care Note  Patient: Sonia Smith  Procedure(s) Performed: Procedure(s): HEMORRHOIDECTOMY (N/A)  Patient Location: PACU  Anesthesia Type:General  Level of Consciousness: awake, alert  and oriented  Airway & Oxygen Therapy: Patient Spontanous Breathing and Patient connected to face mask oxygen  Post-op Assessment: Report given to RN and Post -op Vital signs reviewed and stable  Post vital signs: Reviewed and stable  Last Vitals:  Filed Vitals:   11/06/15 1037 11/06/15 1044  BP: 119/83   Pulse: 76 77  Temp: 36.9 C   Resp: 11 15    Complications: No apparent anesthesia complications

## 2015-11-06 NOTE — H&P (Signed)
  She is here today for internal and external hemorrhoidectomy.  She reports during the interval she has developed a urinary infection which is fairly common for her. She has begun taking Macrobid. No other change in overall condition since the office visit.  I discussed the plan for internal and external hemorrhoidectomy.

## 2015-11-06 NOTE — Discharge Instructions (Signed)
Take Tylenol or Percocet if needed for pain.  May remove dressings later today or tomorrow.  May shower and/or sitting in warm water as desired for comfort and hygiene.  Tuck gauze or pad in underwear as needed for drainage.AMBULATORY SURGERY  DISCHARGE INSTRUCTIONS   1) The drugs that you were given will stay in your system until tomorrow so for the next 24 hours you should not:  A) Drive an automobile B) Make any legal decisions C) Drink any alcoholic beverage   2) You may resume regular meals tomorrow.  Today it is better to start with liquids and gradually work up to solid foods.  You may eat anything you prefer, but it is better to start with liquids, then soup and crackers, and gradually work up to solid foods.   3) Please notify your doctor immediately if you have any unusual bleeding, trouble breathing, redness and pain at the surgery site, drainage, fever, or pain not relieved by medication.    4) Additional Instructions:        Please contact your physician with any problems or Same Day Surgery at [phone], Monday through Friday 6 am to 4 pm, or [place] at [facility] number at [phone].

## 2015-11-06 NOTE — Anesthesia Procedure Notes (Signed)
Procedure Name: LMA Insertion Date/Time: 11/06/2015 9:18 AM Performed by: Justus Memory Pre-anesthesia Checklist: Patient identified, Emergency Drugs available, Patient being monitored and Suction available Patient Re-evaluated:Patient Re-evaluated prior to inductionOxygen Delivery Method: Circle system utilized Preoxygenation: Pre-oxygenation with 100% oxygen Intubation Type: IV induction Ventilation: Mask ventilation without difficulty LMA: LMA inserted LMA Size: 3.5 Placement Confirmation: positive ETCO2,  CO2 detector and breath sounds checked- equal and bilateral Dental Injury: Teeth and Oropharynx as per pre-operative assessment

## 2015-11-07 LAB — SURGICAL PATHOLOGY

## 2015-11-09 ENCOUNTER — Ambulatory Visit (INDEPENDENT_AMBULATORY_CARE_PROVIDER_SITE_OTHER): Payer: BLUE CROSS/BLUE SHIELD | Admitting: Family Medicine

## 2015-11-09 ENCOUNTER — Encounter: Payer: Self-pay | Admitting: Family Medicine

## 2015-11-09 VITALS — BP 108/84 | HR 94 | Temp 98.0°F | Wt 136.2 lb

## 2015-11-09 DIAGNOSIS — N3001 Acute cystitis with hematuria: Secondary | ICD-10-CM

## 2015-11-09 DIAGNOSIS — N390 Urinary tract infection, site not specified: Secondary | ICD-10-CM

## 2015-11-09 DIAGNOSIS — R319 Hematuria, unspecified: Secondary | ICD-10-CM

## 2015-11-09 LAB — POCT URINALYSIS DIPSTICK
Blood, UA: NEGATIVE
Glucose, UA: 100
Ketones, UA: NEGATIVE
Leukocytes, UA: NEGATIVE
Nitrite, UA: POSITIVE
Protein, UA: 30
Spec Grav, UA: <=1.005
Urobilinogen, UA: 1
pH, UA: 5.5

## 2015-11-09 LAB — URINALYSIS, MICROSCOPIC ONLY: RBC / HPF: NONE SEEN (ref 0–?)

## 2015-11-09 MED ORDER — CEPHALEXIN 500 MG PO CAPS: 500.0000 mg | ORAL_CAPSULE | Freq: Two times a day (BID) | ORAL | Status: DC

## 2015-11-09 NOTE — Assessment & Plan Note (Signed)
Symptoms and UA consistent with UTI with positive nitrites. Discussed the location of her tenderness on exam today is not necessarily typical for UTI and could be concerning for appendiceal issue. She's not had any fevers. No nausea or vomiting. No peritoneal signs. She had no abdominal discomfort at any other time other than on palpation. Doubt appendicitis given well appearing on exam, though I discussed potential for working up this area of discomfort with lab work and potential scan and she declined this opting to treat her UTI as her symptoms are most consistent with her past UTIs. Given that she has not responded to Macrobid we will switch her to Keflex. We'll send her urine for culture. We will send for microscopy as well. I discussed that if she were to develop worsening abdominal discomfort, nausea, vomiting, fevers, or feel poorly or have any new or changing symptoms she should be evaluated in the emergency room for possible appendix issue. She voiced understanding. Given return precautions.

## 2015-11-09 NOTE — Patient Instructions (Signed)
Nice to meet you. You have a UTI. We will change you to Keflex. We will send your urine for culture to ensure that there is bacteria and that it is responsive to the antibiotic. If you develop worsening pain, or develop nausea, vomiting, fevers, sweats, or any new or changing symptoms please seek medical attention.

## 2015-11-09 NOTE — Progress Notes (Signed)
Patient ID: Sonia Smith, female   DOB: 01-21-1981, 35 y.o.   MRN: [id number]  Tommi Rumps, MD Phone: [phone]  Sonia Smith is a 35 y.o. female who presents today for same-day visit.  UTI: Patient notes started with dysuria on Saturday. Progressively worsened and Monday. She has Macrobid on hand at home for issues like this. Started the [place] and was a little bit better on Thursday though worse today. Positive frequency and urgency. No vaginal symptoms. Mild bloating though no nausea or vomiting. Did have some suprapubic discomfort until Thursday though none now. Has not had a fever. Highest temperature is 99.89F at home on Monday. Has a history of UTIs in the past and this feels similar to those. She does not have her uterus. She does have her appendix.  PMH: Former smoker.   ROS see history of present illness  Objective  Physical Exam Filed Vitals:   11/09/15 1413  BP: 108/84  Pulse: 94  Temp: 98 F (36.7 C)    BP Readings from Last 3 Encounters:  11/09/15 108/84  11/06/15 107/66  10/22/15 110/70   Wt Readings from Last 3 Encounters:  11/09/15 136 lb 4 oz (61.803 kg)  11/06/15 135 lb (61.236 kg)  10/22/15 134 lb (60.782 kg)    Physical Exam  Constitutional: She is well-developed, well-nourished, and in no distress.  HENT:  Head: Normocephalic and atraumatic.  Right Ear: External ear normal.  Left Ear: External ear normal.  Cardiovascular: Normal rate, regular rhythm and normal heart sounds.   Pulmonary/Chest: Effort normal and breath sounds normal.  Abdominal: Bowel sounds are normal. She exhibits no distension and no mass. There is tenderness (mild right suprapubic/RLQ tenderness). There is no rebound and no guarding.  Neurological: She is alert. Gait normal.  Skin: Skin is warm and dry. She is not diaphoretic.     Assessment/Plan: Please see individual problem list.  UTI (urinary tract infection) Symptoms and UA consistent with UTI with  positive nitrites. Discussed the location of her tenderness on exam today is not necessarily typical for UTI and could be concerning for appendiceal issue. She's not had any fevers. No nausea or vomiting. No peritoneal signs. She had no abdominal discomfort at any other time other than on palpation. Doubt appendicitis given well appearing on exam, though I discussed potential for working up this area of discomfort with lab work and potential scan and she declined this opting to treat her UTI as her symptoms are most consistent with her past UTIs. Given that she has not responded to Macrobid we will switch her to Keflex. We'll send her urine for culture. We will send for microscopy as well. I discussed that if she were to develop worsening abdominal discomfort, nausea, vomiting, fevers, or feel poorly or have any new or changing symptoms she should be evaluated in the emergency room for possible appendix issue. She voiced understanding. Given return precautions.    Orders Placed This Encounter  Procedures  . Urine Culture  . Urine Microscopic Only  . POCT Urinalysis Dipstick    Meds ordered this encounter  Medications  . cephALEXin (KEFLEX) 500 MG capsule    Sig: Take 1 capsule (500 mg total) by mouth 2 (two) times daily.    Dispense:  14 capsule    Refill:  0    Tommi Rumps, MD [place]

## 2015-11-10 LAB — URINE CULTURE
Colony Count: NO GROWTH
Organism ID, Bacteria: NO GROWTH

## 2015-11-11 ENCOUNTER — Encounter: Payer: Self-pay | Admitting: Family Medicine

## 2015-11-11 MED ORDER — FLUCONAZOLE 150 MG PO TABS: 150.0000 mg | ORAL_TABLET | ORAL | Status: DC

## 2015-11-14 ENCOUNTER — Encounter: Payer: BLUE CROSS/BLUE SHIELD | Admitting: Internal Medicine

## 2015-11-21 ENCOUNTER — Encounter: Payer: Self-pay | Admitting: Internal Medicine

## 2015-11-21 MED ORDER — PENTOSAN POLYSULFATE SODIUM 100 MG PO CAPS: 100.0000 mg | ORAL_CAPSULE | Freq: Three times a day (TID) | ORAL | Status: DC

## 2015-11-27 DIAGNOSIS — G588 Other specified mononeuropathies: Secondary | ICD-10-CM | POA: Diagnosis not present

## 2015-11-27 DIAGNOSIS — N3010 Interstitial cystitis (chronic) without hematuria: Secondary | ICD-10-CM | POA: Diagnosis not present

## 2015-11-27 DIAGNOSIS — N390 Urinary tract infection, site not specified: Secondary | ICD-10-CM | POA: Diagnosis not present

## 2015-11-27 DIAGNOSIS — N9489 Other specified conditions associated with female genital organs and menstrual cycle: Secondary | ICD-10-CM | POA: Diagnosis not present

## 2015-12-17 ENCOUNTER — Telehealth: Payer: Self-pay

## 2015-12-17 NOTE — Telephone Encounter (Signed)
PA has never been received that I am aware of--I called [facility] and have the information--[employer] [phone].... ID# [id number] will complete PA asap and contact when I have a response

## 2015-12-19 NOTE — Telephone Encounter (Signed)
Received information from [employer]---they do not process the Rx PA---Called [employer] [place] and was told that it would have to be completed through them---resubmitted through covermymeds for [employer] [place]--waiting on response

## 2015-12-19 NOTE — Telephone Encounter (Signed)
PA has been submitted through [email or web address] and [employer]---waiting on response

## 2015-12-23 ENCOUNTER — Encounter: Payer: Self-pay | Admitting: Internal Medicine

## 2015-12-27 DIAGNOSIS — N398 Other specified disorders of urinary system: Secondary | ICD-10-CM | POA: Diagnosis not present

## 2015-12-27 DIAGNOSIS — G588 Other specified mononeuropathies: Secondary | ICD-10-CM | POA: Diagnosis not present

## 2015-12-28 ENCOUNTER — Other Ambulatory Visit: Payer: 59

## 2016-01-01 DIAGNOSIS — N398 Other specified disorders of urinary system: Secondary | ICD-10-CM | POA: Diagnosis not present

## 2016-01-02 NOTE — Telephone Encounter (Signed)
Received notification that [place] has been approved from [employer] [place] ref # [id number] approved through 12/17/2016

## 2016-01-03 ENCOUNTER — Encounter: Payer: Self-pay | Admitting: Internal Medicine

## 2016-01-03 ENCOUNTER — Ambulatory Visit (INDEPENDENT_AMBULATORY_CARE_PROVIDER_SITE_OTHER): Payer: BLUE CROSS/BLUE SHIELD | Admitting: Internal Medicine

## 2016-01-03 VITALS — BP 110/70 | HR 79 | Temp 98.4°F | Ht 66.5 in | Wt 143.5 lb

## 2016-01-03 DIAGNOSIS — Z0000 Encounter for general adult medical examination without abnormal findings: Secondary | ICD-10-CM

## 2016-01-03 DIAGNOSIS — Z0001 Encounter for general adult medical examination with abnormal findings: Secondary | ICD-10-CM

## 2016-01-03 LAB — COMPREHENSIVE METABOLIC PANEL
ALT: 15 U/L (ref 0–35)
AST: 14 U/L (ref 0–37)
Albumin: 4.6 g/dL (ref 3.5–5.2)
Alkaline Phosphatase: 48 U/L (ref 39–117)
BUN: 7 mg/dL (ref 6–23)
CO2: 32 mEq/L (ref 19–32)
Calcium: 9.4 mg/dL (ref 8.4–10.5)
Chloride: 104 mEq/L (ref 96–112)
Creatinine, Ser: 0.84 mg/dL (ref 0.40–1.20)
GFR: 82.06 mL/min (ref >60.00)
Glucose, Bld: 88 mg/dL (ref 70–99)
Potassium: 3.9 mEq/L (ref 3.5–5.1)
Sodium: 140 mEq/L (ref 135–145)
Total Bilirubin: 0.4 mg/dL (ref 0.2–1.2)
Total Protein: 7.2 g/dL (ref 6.0–8.3)

## 2016-01-03 LAB — CBC
HCT: 42.4 % (ref 36.0–46.0)
Hemoglobin: 14.2 g/dL (ref 12.0–15.0)
MCHC: 33.5 g/dL (ref 30.0–36.0)
MCV: 95.3 fl (ref 78.0–100.0)
Platelets: 216 10*3/uL (ref 150.0–400.0)
RBC: 4.45 Mil/uL (ref 3.87–5.11)
RDW: 12.7 % (ref 11.5–15.5)
WBC: 6.1 10*3/uL (ref 4.0–10.5)

## 2016-01-03 LAB — LIPID PANEL
Cholesterol: 210 mg/dL — ABNORMAL HIGH (ref 0–200)
HDL: 44.7 mg/dL (ref >39.00)
LDL Cholesterol: 154 mg/dL — ABNORMAL HIGH (ref 0–99)
NonHDL: 165.27
Total CHOL/HDL Ratio: 5
Triglycerides: 58 mg/dL (ref 0.0–149.0)
VLDL: 11.6 mg/dL (ref 0.0–40.0)

## 2016-01-03 MED ORDER — TRETINOIN MICROSPHERE 0.04 % EX GEL: Freq: Every day | CUTANEOUS | Status: DC

## 2016-01-03 NOTE — Patient Instructions (Signed)
Health Maintenance, Female Adopting a healthy lifestyle and getting preventive care can go a long way to promote health and wellness. Talk with your health care provider about what schedule of regular examinations is right for you. This is a good chance for you to check in with your provider about disease prevention and staying healthy. In between checkups, there are plenty of things you can do on your own. Experts have done a lot of research about which lifestyle changes and preventive measures are most likely to keep you healthy. Ask your health care provider for more information. WEIGHT AND DIET  Eat a healthy diet  Be sure to include plenty of vegetables, fruits, low-fat dairy products, and lean protein.  Do not eat a lot of foods high in solid fats, added sugars, or salt.  Get regular exercise. This is one of the most important things you can do for your health.  Most adults should exercise for at least 150 minutes each week. The exercise should increase your heart rate and make you sweat (moderate-intensity exercise).  Most adults should also do strengthening exercises at least twice a week. This is in addition to the moderate-intensity exercise.  Maintain a healthy weight  Body mass index (BMI) is a measurement that can be used to identify possible weight problems. It estimates body fat based on height and weight. Your health care provider can help determine your BMI and help you achieve or maintain a healthy weight.  For females 20 years of age and older:   A BMI below 18.5 is considered underweight.  A BMI of 18.5 to 24.9 is normal.  A BMI of 25 to 29.9 is considered overweight.  A BMI of 30 and above is considered obese.  Watch levels of cholesterol and blood lipids  You should start having your blood tested for lipids and cholesterol at 35 years of age, then have this test every 5 years.  You may need to have your cholesterol levels checked more often if:  Your lipid  or cholesterol levels are high.  You are older than 35 years of age.  You are at high risk for heart disease.  CANCER SCREENING   Lung Cancer  Lung cancer screening is recommended for adults 55-80 years old who are at high risk for lung cancer because of a history of smoking.  A yearly low-dose CT scan of the lungs is recommended for people who:  Currently smoke.  Have quit within the past 15 years.  Have at least a 30-pack-year history of smoking. A pack year is smoking an average of one pack of cigarettes a day for 1 year.  Yearly screening should continue until it has been 15 years since you quit.  Yearly screening should stop if you develop a health problem that would prevent you from having lung cancer treatment.  Breast Cancer  Practice breast self-awareness. This means understanding how your breasts normally appear and feel.  It also means doing regular breast self-exams. Let your health care provider know about any changes, no matter how small.  If you are in your 20s or 30s, you should have a clinical breast exam (CBE) by a health care provider every 1-3 years as part of a regular health exam.  If you are 40 or older, have a CBE every year. Also consider having a breast X-ray (mammogram) every year.  If you have a family history of breast cancer, talk to your health care provider about genetic screening.  If you   are at high risk for breast cancer, talk to your health care provider about having an MRI and a mammogram every year.  Breast cancer gene (BRCA) assessment is recommended for women who have family members with BRCA-related cancers. BRCA-related cancers include:  Breast.  Ovarian.  Tubal.  Peritoneal cancers.  Results of the assessment will determine the need for genetic counseling and BRCA1 and BRCA2 testing. Cervical Cancer Your health care provider may recommend that you be screened regularly for cancer of the pelvic organs (ovaries, uterus, and  vagina). This screening involves a pelvic examination, including checking for microscopic changes to the surface of your cervix (Pap test). You may be encouraged to have this screening done every 3 years, beginning at age 21.  For women ages 30-65, health care providers may recommend pelvic exams and Pap testing every 3 years, or they may recommend the Pap and pelvic exam, combined with testing for human papilloma virus (HPV), every 5 years. Some types of HPV increase your risk of cervical cancer. Testing for HPV may also be done on women of any age with unclear Pap test results.  Other health care providers may not recommend any screening for nonpregnant women who are considered low risk for pelvic cancer and who do not have symptoms. Ask your health care provider if a screening pelvic exam is right for you.  If you have had past treatment for cervical cancer or a condition that could lead to cancer, you need Pap tests and screening for cancer for at least 20 years after your treatment. If Pap tests have been discontinued, your risk factors (such as having a new sexual partner) need to be reassessed to determine if screening should resume. Some women have medical problems that increase the chance of getting cervical cancer. In these cases, your health care provider may recommend more frequent screening and Pap tests. Colorectal Cancer  This type of cancer can be detected and often prevented.  Routine colorectal cancer screening usually begins at 35 years of age and continues through 35 years of age.  Your health care provider may recommend screening at an earlier age if you have risk factors for colon cancer.  Your health care provider may also recommend using home test kits to check for hidden blood in the stool.  A small camera at the end of a tube can be used to examine your colon directly (sigmoidoscopy or colonoscopy). This is done to check for the earliest forms of colorectal  cancer.  Routine screening usually begins at age 50.  Direct examination of the colon should be repeated every 5-10 years through 35 years of age. However, you may need to be screened more often if early forms of precancerous polyps or small growths are found. Skin Cancer  Check your skin from head to toe regularly.  Tell your health care provider about any new moles or changes in moles, especially if there is a change in a mole's shape or color.  Also tell your health care provider if you have a mole that is larger than the size of a pencil eraser.  Always use sunscreen. Apply sunscreen liberally and repeatedly throughout the day.  Protect yourself by wearing long sleeves, pants, a wide-brimmed hat, and sunglasses whenever you are outside. HEART DISEASE, DIABETES, AND HIGH BLOOD PRESSURE   High blood pressure causes heart disease and increases the risk of stroke. High blood pressure is more likely to develop in:  People who have blood pressure in the high end   of the normal range (130-139/85-89 mm Hg).  People who are overweight or obese.  People who are African American.  If you are 38-23 years of age, have your blood pressure checked every 3-5 years. If you are 61 years of age or older, have your blood pressure checked every year. You should have your blood pressure measured twice--once when you are at a hospital or clinic, and once when you are not at a hospital or clinic. Record the average of the two measurements. To check your blood pressure when you are not at a hospital or clinic, you can use:  An automated blood pressure machine at a pharmacy.  A home blood pressure monitor.  If you are between 45 years and 39 years old, ask your health care provider if you should take aspirin to prevent strokes.  Have regular diabetes screenings. This involves taking a blood sample to check your fasting blood sugar level.  If you are at a normal weight and have a low risk for diabetes,  have this test once every three years after 35 years of age.  If you are overweight and have a high risk for diabetes, consider being tested at a younger age or more often. PREVENTING INFECTION  Hepatitis B  If you have a higher risk for hepatitis B, you should be screened for this virus. You are considered at high risk for hepatitis B if:  You were born in a country where hepatitis B is common. Ask your health care provider which countries are considered high risk.  Your parents were born in a high-risk country, and you have not been immunized against hepatitis B (hepatitis B vaccine).  You have HIV or AIDS.  You use needles to inject street drugs.  You live with someone who has hepatitis B.  You have had sex with someone who has hepatitis B.  You get hemodialysis treatment.  You take certain medicines for conditions, including cancer, organ transplantation, and autoimmune conditions. Hepatitis C  Blood testing is recommended for:  Everyone born from [birth year] through 1965.  Anyone with known risk factors for hepatitis C. Sexually transmitted infections (STIs)  You should be screened for sexually transmitted infections (STIs) including gonorrhea and chlamydia if:  You are sexually active and are younger than 35 years of age.  You are older than 35 years of age and your health care provider tells you that you are at risk for this type of infection.  Your sexual activity has changed since you were last screened and you are at an increased risk for chlamydia or gonorrhea. Ask your health care provider if you are at risk.  If you do not have HIV, but are at risk, it may be recommended that you take a prescription medicine daily to prevent HIV infection. This is called pre-exposure prophylaxis (PrEP). You are considered at risk if:  You are sexually active and do not regularly use condoms or know the HIV status of your partner(s).  You take drugs by injection.  You are sexually  active with a partner who has HIV. Talk with your health care provider about whether you are at high risk of being infected with HIV. If you choose to begin PrEP, you should first be tested for HIV. You should then be tested every 3 months for as long as you are taking PrEP.  PREGNANCY   If you are premenopausal and you may become pregnant, ask your health care provider about preconception counseling.  If you may  become pregnant, take 400 to 800 micrograms (mcg) of folic acid every day.  If you want to prevent pregnancy, talk to your health care provider about birth control (contraception). OSTEOPOROSIS AND MENOPAUSE   Osteoporosis is a disease in which the bones lose minerals and strength with aging. This can result in serious bone fractures. Your risk for osteoporosis can be identified using a bone density scan.  If you are 61 years of age or older, or if you are at risk for osteoporosis and fractures, ask your health care provider if you should be screened.  Ask your health care provider whether you should take a calcium or vitamin D supplement to lower your risk for osteoporosis.  Menopause may have certain physical symptoms and risks.  Hormone replacement therapy may reduce some of these symptoms and risks. Talk to your health care provider about whether hormone replacement therapy is right for you.  HOME CARE INSTRUCTIONS   Schedule regular health, dental, and eye exams.  Stay current with your immunizations.   Do not use any tobacco products including cigarettes, chewing tobacco, or electronic cigarettes.  If you are pregnant, do not drink alcohol.  If you are breastfeeding, limit how much and how often you drink alcohol.  Limit alcohol intake to no more than 1 drink per day for nonpregnant women. One drink equals 12 ounces of beer, 5 ounces of wine, or 1 ounces of hard liquor.  Do not use street drugs.  Do not share needles.  Ask your health care provider for help if  you need support or information about quitting drugs.  Tell your health care provider if you often feel depressed.  Tell your health care provider if you have ever been abused or do not feel safe at home.   This information is not intended to replace advice given to you by your health care provider. Make sure you discuss any questions you have with your health care provider.   Document Released: 02/03/2011 Document Revised: 08/11/2014 Document Reviewed: 06/22/2013 Elsevier Interactive Patient Education [employer].

## 2016-01-03 NOTE — Progress Notes (Signed)
Subjective:    Patient ID: Sonia Smith, female    DOB: 21-Apr-1981, 35 y.o.   MRN: [id number]  HPI  Pt presents to the clinic today for her annual exam.  Flu: never Tetanys: 2016 Pap Smear: 03/2015 Dentist: as needed  Diet: She does eat meat. She consumes fruits and veggies daily. She does consume some fried foods. She drinks mostly water. Exercise: None secondary to chronic pelvic pain.  Review of Systems  Past Medical History  Diagnosis Date  . Blood in stool   . Migraines   . GERD (gastroesophageal reflux disease)   . Vaginal Pap smear, abnormal   . Lupus ([place])     tested positive many years ago, most recent came back negative.  . Painful intercourse   . Childhood asthma     pt has out grown  . Depression     in high school    Current Outpatient Prescriptions  Medication Sig Dispense Refill  . amitriptyline (ELAVIL) 25 MG tablet Take 25 mg by mouth at bedtime. 1-2 tablets at bedtime    . cyclobenzaprine (FLEXERIL) 10 MG tablet Take 10 mg by mouth 3 (three) times daily as needed.    . gabapentin (NEURONTIN) 600 MG tablet Take 2 tablets by mouth 3 (three) times daily.    . hydrOXYzine (VISTARIL) 100 MG capsule Take 1 capsule (100 mg total) by mouth 3 (three) times daily as needed for itching. 30 capsule 0  . ibuprofen (ADVIL,MOTRIN) 200 MG tablet Take 400 mg by mouth every 6 (six) hours as needed for fever, headache or mild pain.    Marland Kitchen lidocaine (XYLOCAINE) 5 % ointment Apply 1 application topically as needed.    . nitrofurantoin, macrocrystal-monohydrate, (MACROBID) 100 MG capsule Take 100 mg by mouth once. Reported on 10/12/2015    . docusate sodium (COLACE) 250 MG capsule Take 1,500 mg by mouth daily.     No current facility-administered medications for this visit.    Allergies  Allergen Reactions  . Estradiol Swelling and Other (See Comments)    Skin peels off  . Adhesive [Tape] Rash    From bandaides. Tape is OK.    Family History  Problem Relation  Age of Onset  . Diabetes Mother   . Hypertension Mother   . Cancer Father     lung  . Diabetes Father   . Hypertension Father   . Heart disease Maternal Grandmother   . Heart disease Maternal Grandfather   . Cancer Paternal Grandfather   . Stroke Neg Hx     Social History   Social History  . Marital Status: Married    Spouse Name: N/A  . Number of Children: N/A  . Years of Education: N/A   Occupational History  . Not on file.   Social History Main Topics  . Smoking status: Former Smoker    Quit date: 10/08/1999  . Smokeless tobacco: Never Used     Comment: quit 2001  . Alcohol Use: No  . Drug Use: No  . Sexual Activity: Yes    Birth Control/ Protection: Surgical   Other Topics Concern  . Not on file   Social History Narrative     Constitutional: Pt reports weight gain. Denies fever, malaise, fatigue, headache.  HEENT: Pt reports nasal congestion. Denies eye pain, eye redness, ear pain, ringing in the ears, wax buildup, runny nose, bloody nose, or sore throat. Respiratory: Denies difficulty breathing, shortness of breath, cough or sputum production.   Cardiovascular: Denies  chest pain, chest tightness, palpitations or swelling in the hands or feet.  Gastrointestinal: Denies abdominal pain, bloating, constipation, diarrhea or blood in the stool.  GU: Pt report chronic pelvic pain. Denies urgency, frequency, pain with urination, burning sensation, blood in urine, odor or discharge. Musculoskeletal: Denies decrease in range of motion, difficulty with gait, muscle pain or joint pain and swelling.  Skin: Denies redness, rashes, lesions or ulcercations.  Neurological: Denies dizziness, difficulty with memory, difficulty with speech or problems with balance and coordination.  Psych: Denies anxiety, depression, SI/HI.  No other specific complaints in a complete review of systems (except as listed in HPI above).     Objective:   Physical Exam   BP 110/70 mmHg  Pulse  79  Temp(Src) 98.4 F (36.9 C) (Oral)  Ht 5' 6.5" (1.689 m)  Wt 143 lb 8 oz (65.091 kg)  BMI 22.82 kg/m2  SpO2 98% Wt Readings from Last 3 Encounters:  01/03/16 143 lb 8 oz (65.091 kg)  11/09/15 136 lb 4 oz (61.803 kg)  11/06/15 135 lb (61.236 kg)    General: Appears her stated age, well developed, well nourished in NAD. Skin: Warm, dry and intact. No rashes, lesions or ulcerations noted. HEENT: Head: normal shape and size; Eyes: sclera white, no icterus, conjunctiva pink, PERRLA and EOMs intact; Ears: Tm's gray and intact, normal light reflex; Nose: mucosa pink and moist, septum midline; Throat/Mouth: Teeth present, mucosa pink and moist, no exudate, lesions or ulcerations noted.  Neck:  Neck supple, trachea midline. No masses, lumps or thyromegaly present.  Cardiovascular: Normal rate and rhythm. S1,S2 noted.  No murmur, rubs or gallops noted. Trace swelling of LLE. Pulmonary/Chest: Normal effort and positive vesicular breath sounds. No respiratory distress. No wheezes, rales or ronchi noted.  Abdomen: Soft and nontender. Normal bowel sounds\. No distention or masses noted. Liver, spleen and kidneys non palpable. Musculoskeletal: Strength 5/5 BUE/BLE. Normal range of motion. No signs of joint swelling. No difficulty with gait.  Neurological: Alert and oriented. Cranial nerves II-XII grossly intact. Coordination normal.  Psychiatric: Mood and affect normal. Behavior is normal. Judgment and thought content normal.    BMET    Component Value Date/Time   NA 142 08/19/2015 0158   K 4.6 08/19/2015 0158   CL 105 08/19/2015 0158   CO2 31 08/19/2015 0158   GLUCOSE 107* 08/19/2015 0158   BUN 8 08/19/2015 0158   CREATININE 0.73 08/19/2015 0158   CALCIUM 9.4 08/19/2015 0158   GFRNONAA >60 08/19/2015 0158   GFRAA >60 08/19/2015 0158    Lipid Panel     Component Value Date/Time   CHOL 155 11/13/2014 1524   TRIG 90.0 11/13/2014 1524   HDL 44.00 11/13/2014 1524   CHOLHDL 4 11/13/2014  1524   VLDL 18.0 11/13/2014 1524   LDLCALC 93 11/13/2014 1524    CBC    Component Value Date/Time   WBC 8.5 08/19/2015 0158   RBC 4.85 08/19/2015 0158   HGB 14.8 08/19/2015 0158   HCT 44.0 08/19/2015 0158   PLT 189 08/19/2015 0158   MCV 90.8 08/19/2015 0158   MCH 30.6 08/19/2015 0158   MCHC 33.7 08/19/2015 0158   RDW 12.7 08/19/2015 0158    Hgb A1C No results found for: HGBA1C      Assessment & Plan:   Preventative Health Maintenance:  She declines flu Tetanus UTD Pap Smear UTD Encouraged her to see a dentist at least annually Encouraged her to consume a balanced diet and start an exercise  regimen Will check CBC, CMET, Lipid profile today  RTC in 1 year, sooner if needed

## 2016-01-03 NOTE — Progress Notes (Signed)
Pre visit review using our clinic review tool, if applicable. No additional management support is needed unless otherwise documented below in the visit note. 

## 2016-01-08 ENCOUNTER — Encounter: Payer: Self-pay | Admitting: Internal Medicine

## 2016-01-08 NOTE — Addendum Note (Signed)
Addended by: Lurlean Nanny on: 01/08/2016 02:15 PM   Modules accepted: Miquel Dunn

## 2016-01-16 DIAGNOSIS — M545 Low back pain: Secondary | ICD-10-CM | POA: Diagnosis not present

## 2016-01-16 DIAGNOSIS — M5416 Radiculopathy, lumbar region: Secondary | ICD-10-CM | POA: Diagnosis not present

## 2016-01-16 DIAGNOSIS — M4120 Other idiopathic scoliosis, site unspecified: Secondary | ICD-10-CM | POA: Diagnosis not present

## 2016-01-16 DIAGNOSIS — G588 Other specified mononeuropathies: Secondary | ICD-10-CM | POA: Diagnosis not present

## 2016-01-16 DIAGNOSIS — M1610 Unilateral primary osteoarthritis, unspecified hip: Secondary | ICD-10-CM | POA: Diagnosis not present

## 2016-01-19 DIAGNOSIS — M5126 Other intervertebral disc displacement, lumbar region: Secondary | ICD-10-CM | POA: Diagnosis not present

## 2016-01-19 DIAGNOSIS — M4120 Other idiopathic scoliosis, site unspecified: Secondary | ICD-10-CM | POA: Diagnosis not present

## 2016-01-22 DIAGNOSIS — N3941 Urge incontinence: Secondary | ICD-10-CM | POA: Insufficient documentation

## 2016-01-22 DIAGNOSIS — R102 Pelvic and perineal pain: Secondary | ICD-10-CM | POA: Diagnosis not present

## 2016-01-22 DIAGNOSIS — N3010 Interstitial cystitis (chronic) without hematuria: Secondary | ICD-10-CM | POA: Diagnosis not present

## 2016-01-22 DIAGNOSIS — G588 Other specified mononeuropathies: Secondary | ICD-10-CM | POA: Diagnosis not present

## 2016-01-23 DIAGNOSIS — N9419 Other specified dyspareunia: Secondary | ICD-10-CM | POA: Diagnosis not present

## 2016-01-23 DIAGNOSIS — M62838 Other muscle spasm: Secondary | ICD-10-CM | POA: Diagnosis not present

## 2016-01-23 DIAGNOSIS — R278 Other lack of coordination: Secondary | ICD-10-CM | POA: Diagnosis not present

## 2016-01-23 DIAGNOSIS — R102 Pelvic and perineal pain: Secondary | ICD-10-CM | POA: Diagnosis not present

## 2016-01-30 DIAGNOSIS — G588 Other specified mononeuropathies: Secondary | ICD-10-CM | POA: Diagnosis not present

## 2016-01-30 DIAGNOSIS — M1610 Unilateral primary osteoarthritis, unspecified hip: Secondary | ICD-10-CM | POA: Diagnosis not present

## 2016-01-30 DIAGNOSIS — M545 Low back pain: Secondary | ICD-10-CM | POA: Diagnosis not present

## 2016-01-30 DIAGNOSIS — M4120 Other idiopathic scoliosis, site unspecified: Secondary | ICD-10-CM | POA: Diagnosis not present

## 2016-02-01 ENCOUNTER — Encounter: Payer: Self-pay | Admitting: Internal Medicine

## 2016-02-01 DIAGNOSIS — M62838 Other muscle spasm: Secondary | ICD-10-CM | POA: Diagnosis not present

## 2016-02-01 DIAGNOSIS — R278 Other lack of coordination: Secondary | ICD-10-CM | POA: Diagnosis not present

## 2016-02-01 DIAGNOSIS — R102 Pelvic and perineal pain: Secondary | ICD-10-CM | POA: Diagnosis not present

## 2016-02-01 DIAGNOSIS — N9419 Other specified dyspareunia: Secondary | ICD-10-CM | POA: Diagnosis not present

## 2016-02-07 DIAGNOSIS — R102 Pelvic and perineal pain: Secondary | ICD-10-CM | POA: Diagnosis not present

## 2016-02-07 DIAGNOSIS — R278 Other lack of coordination: Secondary | ICD-10-CM | POA: Diagnosis not present

## 2016-02-07 DIAGNOSIS — N9419 Other specified dyspareunia: Secondary | ICD-10-CM | POA: Diagnosis not present

## 2016-02-07 DIAGNOSIS — M62838 Other muscle spasm: Secondary | ICD-10-CM | POA: Diagnosis not present

## 2016-02-11 DIAGNOSIS — M25551 Pain in right hip: Secondary | ICD-10-CM | POA: Diagnosis not present

## 2016-02-19 DIAGNOSIS — N9419 Other specified dyspareunia: Secondary | ICD-10-CM | POA: Diagnosis not present

## 2016-02-19 DIAGNOSIS — M62838 Other muscle spasm: Secondary | ICD-10-CM | POA: Diagnosis not present

## 2016-02-19 DIAGNOSIS — R102 Pelvic and perineal pain: Secondary | ICD-10-CM | POA: Diagnosis not present

## 2016-02-19 DIAGNOSIS — R278 Other lack of coordination: Secondary | ICD-10-CM | POA: Diagnosis not present

## 2016-02-26 DIAGNOSIS — R278 Other lack of coordination: Secondary | ICD-10-CM | POA: Diagnosis not present

## 2016-02-26 DIAGNOSIS — M62838 Other muscle spasm: Secondary | ICD-10-CM | POA: Diagnosis not present

## 2016-02-26 DIAGNOSIS — N9419 Other specified dyspareunia: Secondary | ICD-10-CM | POA: Diagnosis not present

## 2016-02-26 DIAGNOSIS — R102 Pelvic and perineal pain: Secondary | ICD-10-CM | POA: Diagnosis not present

## 2016-03-03 DIAGNOSIS — M62838 Other muscle spasm: Secondary | ICD-10-CM | POA: Diagnosis not present

## 2016-03-03 DIAGNOSIS — R278 Other lack of coordination: Secondary | ICD-10-CM | POA: Diagnosis not present

## 2016-03-03 DIAGNOSIS — R102 Pelvic and perineal pain: Secondary | ICD-10-CM | POA: Diagnosis not present

## 2016-03-03 DIAGNOSIS — N9419 Other specified dyspareunia: Secondary | ICD-10-CM | POA: Diagnosis not present

## 2016-03-11 DIAGNOSIS — N9419 Other specified dyspareunia: Secondary | ICD-10-CM | POA: Diagnosis not present

## 2016-03-11 DIAGNOSIS — R102 Pelvic and perineal pain: Secondary | ICD-10-CM | POA: Diagnosis not present

## 2016-03-11 DIAGNOSIS — R278 Other lack of coordination: Secondary | ICD-10-CM | POA: Diagnosis not present

## 2016-03-11 DIAGNOSIS — M62838 Other muscle spasm: Secondary | ICD-10-CM | POA: Diagnosis not present

## 2016-03-19 DIAGNOSIS — N3010 Interstitial cystitis (chronic) without hematuria: Secondary | ICD-10-CM | POA: Diagnosis not present

## 2016-03-19 DIAGNOSIS — R102 Pelvic and perineal pain: Secondary | ICD-10-CM | POA: Diagnosis not present

## 2016-03-19 DIAGNOSIS — M62838 Other muscle spasm: Secondary | ICD-10-CM | POA: Diagnosis not present

## 2016-03-19 DIAGNOSIS — R278 Other lack of coordination: Secondary | ICD-10-CM | POA: Diagnosis not present

## 2016-03-20 DIAGNOSIS — R102 Pelvic and perineal pain: Secondary | ICD-10-CM | POA: Diagnosis not present

## 2016-03-20 DIAGNOSIS — G588 Other specified mononeuropathies: Secondary | ICD-10-CM | POA: Diagnosis not present

## 2016-03-24 DIAGNOSIS — N3010 Interstitial cystitis (chronic) without hematuria: Secondary | ICD-10-CM | POA: Diagnosis not present

## 2016-03-24 DIAGNOSIS — R102 Pelvic and perineal pain: Secondary | ICD-10-CM | POA: Diagnosis not present

## 2016-03-24 DIAGNOSIS — M62838 Other muscle spasm: Secondary | ICD-10-CM | POA: Diagnosis not present

## 2016-03-24 DIAGNOSIS — R278 Other lack of coordination: Secondary | ICD-10-CM | POA: Diagnosis not present

## 2016-03-27 ENCOUNTER — Encounter: Payer: 59 | Admitting: Obstetrics and Gynecology

## 2016-04-01 DIAGNOSIS — Z79899 Other long term (current) drug therapy: Secondary | ICD-10-CM | POA: Diagnosis not present

## 2016-04-01 DIAGNOSIS — G588 Other specified mononeuropathies: Secondary | ICD-10-CM | POA: Diagnosis not present

## 2016-04-01 DIAGNOSIS — M4716 Other spondylosis with myelopathy, lumbar region: Secondary | ICD-10-CM | POA: Diagnosis not present

## 2016-04-01 DIAGNOSIS — Z5181 Encounter for therapeutic drug level monitoring: Secondary | ICD-10-CM | POA: Diagnosis not present

## 2016-04-15 ENCOUNTER — Encounter: Payer: Self-pay | Admitting: Obstetrics and Gynecology

## 2016-04-16 DIAGNOSIS — R109 Unspecified abdominal pain: Secondary | ICD-10-CM | POA: Insufficient documentation

## 2016-04-23 ENCOUNTER — Other Ambulatory Visit: Payer: Self-pay | Admitting: Obstetrics and Gynecology

## 2016-04-23 DIAGNOSIS — N9489 Other specified conditions associated with female genital organs and menstrual cycle: Secondary | ICD-10-CM

## 2016-05-05 DIAGNOSIS — Z87891 Personal history of nicotine dependence: Secondary | ICD-10-CM | POA: Diagnosis not present

## 2016-05-05 DIAGNOSIS — R102 Pelvic and perineal pain: Secondary | ICD-10-CM | POA: Diagnosis not present

## 2016-05-05 DIAGNOSIS — G588 Other specified mononeuropathies: Secondary | ICD-10-CM | POA: Diagnosis not present

## 2016-05-05 DIAGNOSIS — G8929 Other chronic pain: Secondary | ICD-10-CM | POA: Diagnosis not present

## 2016-05-13 ENCOUNTER — Encounter: Payer: Self-pay | Admitting: *Deleted

## 2016-05-16 ENCOUNTER — Ambulatory Visit (INDEPENDENT_AMBULATORY_CARE_PROVIDER_SITE_OTHER): Payer: BLUE CROSS/BLUE SHIELD | Admitting: Obstetrics and Gynecology

## 2016-05-16 ENCOUNTER — Other Ambulatory Visit: Payer: Self-pay | Admitting: Obstetrics and Gynecology

## 2016-05-16 ENCOUNTER — Encounter: Payer: Self-pay | Admitting: Obstetrics and Gynecology

## 2016-05-16 VITALS — BP 107/84 | HR 103 | Ht 66.5 in | Wt 140.8 lb

## 2016-05-16 DIAGNOSIS — Z01411 Encounter for gynecological examination (general) (routine) with abnormal findings: Secondary | ICD-10-CM | POA: Diagnosis not present

## 2016-05-16 DIAGNOSIS — Z01419 Encounter for gynecological examination (general) (routine) without abnormal findings: Secondary | ICD-10-CM | POA: Diagnosis not present

## 2016-05-16 NOTE — Progress Notes (Signed)
Subjective:   Sonia Smith is a 35 y.o. G84P0 Caucasian female here for a routine well-woman exam.  No LMP recorded. Patient has had a hysterectomy.    Current complaints: nothing new PCP: Baity       doesn't desire labs  Social History: Sexual: heterosexual Marital Status: divorced Living situation: joint custody  Occupation: unemployed Tobacco/alcohol: no tobacco use Illicit drugs: no history of illicit drug use  The following portions of the patient's history were reviewed and updated as appropriate: allergies, current medications, past family history, past medical history, past social history, past surgical history and problem list.  Past Medical History Past Medical History:  Diagnosis Date  . Blood in stool   . 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 Depression    in high school  . GERD (gastroesophageal reflux disease)   . Lupus    tested positive many years ago, most recent came back negative.  . Migraines   . Painful intercourse   . Pudendal neuralgia   . Vaginal Pap smear, abnormal     Past Surgical History Past Surgical History:  Procedure Laterality Date  . ABDOMINAL HYSTERECTOMY  2008  . 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 CERVICAL CERCLAGE  2001, 2004  . HEMORRHOID SURGERY N/A 11/06/2015   Procedure: HEMORRHOIDECTOMY;  Surgeon: Leonie Green, MD;  Location: [facility] ORS;  Service: General;  Laterality: N/A;  . LEEP  1998   dysplasia    Gynecologic History G2P0  No LMP recorded. Patient has had a hysterectomy. Contraception: status post hysterectomy Last Pap: 2016. Results were: normal   Obstetric History OB History  Gravida Para Term Preterm AB Living  2            SAB TAB Ectopic Multiple Live Births               # Outcome Date GA Lbr Len/2nd Weight Sex Delivery Anes PTL Lv  2 Gravida 2004    M Vag-Spont     1 Gravida 2002    F Vag-Spont         Current Medications Current Outpatient Prescriptions  on File Prior to Visit  Medication Sig Dispense Refill  . gabapentin (NEURONTIN) 600 MG tablet Take 2 tablets by mouth 3 (three) times daily.    Marland Kitchen ibuprofen (ADVIL,MOTRIN) 200 MG tablet Take 400 mg by mouth every 6 (six) hours as needed for fever, headache or mild pain.     No current facility-administered medications on file prior to visit.     Review of Systems Patient denies any headaches, blurred vision, shortness of breath, chest pain, abdominal pain, problems with bowel movements, urination, or intercourse.  Objective:  BP 107/84   Pulse (!) 103   Ht 5' 6.5" (1.689 m)   Wt 140 lb 12.8 oz (63.9 kg)   BMI 22.39 kg/m  Physical Exam  General:  Well developed, well nourished, no acute distress. She is alert and oriented x3. Skin:  Warm and dry Neck:  Midline trachea, no thyromegaly or nodules Cardiovascular: Regular rate and rhythm, no murmur heard Lungs:  Effort normal, all lung fields clear to auscultation bilaterally Breasts:  No dominant palpable mass, retraction, or nipple discharge Abdomen:  Soft, non tender, no hepatosplenomegaly or masses Pelvic:  External genitalia is normal in appearance.  The vagina is normal in appearance. The cervix is bulbous, no CMT.  Thin prep pap is done with HR  HPV cotesting. Uterus is surgically absent. No adnexal masses or tenderness noted. Extremities:  No swelling or varicosities noted Psych:  She has a normal mood and affect  Assessment:   Healthy well-woman exam  Plan:  Pap only obtained F/U 1 year for AE, or sooner if needed  Melody Rockney Ghee, CNM

## 2016-05-16 NOTE — Patient Instructions (Signed)
Preventive Care for Adults, Female A healthy lifestyle and preventive care can promote health and wellness. Preventive health guidelines for women include the following key practices.  A routine yearly physical is a good way to check with your health care provider about your health and preventive screening. It is a chance to share any concerns and updates on your health and to receive a thorough exam.  Visit your dentist for a routine exam and preventive care every 6 months. Brush your teeth twice a day and floss once a day. Good oral hygiene prevents tooth decay and gum disease.  The frequency of eye exams is based on your age, health, family medical history, use of contact lenses, and other factors. Follow your health care provider's recommendations for frequency of eye exams.  Eat a healthy diet. Foods like vegetables, fruits, whole grains, low-fat dairy products, and lean protein foods contain the nutrients you need without too many calories. Decrease your intake of foods high in solid fats, added sugars, and salt. Eat the right amount of calories for you.Get information about a proper diet from your health care provider, if necessary.  Regular physical exercise is one of the most important things you can do for your health. Most adults should get at least 150 minutes of moderate-intensity exercise (any activity that increases your heart rate and causes you to sweat) each week. In addition, most adults need muscle-strengthening exercises on 2 or more days a week.  Maintain a healthy weight. The body mass index (BMI) is a screening tool to identify possible weight problems. It provides an estimate of body fat based on height and weight. Your health care provider can find your BMI and can help you achieve or maintain a healthy weight.For adults 20 years and older:  A BMI below 18.5 is considered underweight.  A BMI of 18.5 to 24.9 is normal.  A BMI of 25 to 29.9 is considered  overweight.  A BMI of 30 and above is considered obese.  Maintain normal blood lipids and cholesterol levels by exercising and minimizing your intake of saturated fat. Eat a balanced diet with plenty of fruit and vegetables. Blood tests for lipids and cholesterol should begin at age 64 and be repeated every 5 years. If your lipid or cholesterol levels are high, you are over 50, or you are at high risk for heart disease, you may need your cholesterol levels checked more frequently.Ongoing high lipid and cholesterol levels should be treated with medicines if diet and exercise are not working.  If you smoke, find out from your health care provider how to quit. If you do not use tobacco, do not start.  Lung cancer screening is recommended for adults aged 52-80 years who are at high risk for developing lung cancer because of a history of smoking. A yearly low-dose CT scan of the lungs is recommended for people who have at least a 30-pack-year history of smoking and are a current smoker or have quit within the past 15 years. A pack year of smoking is smoking an average of 1 pack of cigarettes a day for 1 year (for example: 1 pack a day for 30 years or 2 packs a day for 15 years). Yearly screening should continue until the smoker has stopped smoking for at least 15 years. Yearly screening should be stopped for people who develop a health problem that would prevent them from having lung cancer treatment.  If you are pregnant, do not drink alcohol. If you are  breastfeeding, be very cautious about drinking alcohol. If you are not pregnant and choose to drink alcohol, do not have more than 1 drink per day. One drink is considered to be 12 ounces (355 mL) of beer, 5 ounces (148 mL) of wine, or 1.5 ounces (44 mL) of liquor.  Avoid use of street drugs. Do not share needles with anyone. Ask for help if you need support or instructions about stopping the use of drugs.  High blood pressure causes heart disease and  increases the risk of stroke. Your blood pressure should be checked at least every 1 to 2 years. Ongoing high blood pressure should be treated with medicines if weight loss and exercise do not work.  If you are 25-78 years old, ask your health care provider if you should take aspirin to prevent strokes.  Diabetes screening is done by taking a blood sample to check your blood glucose level after you have not eaten for a certain period of time (fasting). If you are not overweight and you do not have risk factors for diabetes, you should be screened once every 3 years starting at age 86. If you are overweight or obese and you are 3-87 years of age, you should be screened for diabetes every year as part of your cardiovascular risk assessment.  Breast cancer screening is essential preventive care for women. You should practice "breast self-awareness." This means understanding the normal appearance and feel of your breasts and may include breast self-examination. Any changes detected, no matter how small, should be reported to a health care provider. Women in their 66s and 30s should have a clinical breast exam (CBE) by a health care provider as part of a regular health exam every 1 to 3 years. After age 43, women should have a CBE every year. Starting at age 37, women should consider having a mammogram (breast X-ray test) every year. Women who have a family history of breast cancer should talk to their health care provider about genetic screening. Women at a high risk of breast cancer should talk to their health care providers about having an MRI and a mammogram every year.  Breast cancer gene (BRCA)-related cancer risk assessment is recommended for women who have family members with BRCA-related cancers. BRCA-related cancers include breast, ovarian, tubal, and peritoneal cancers. Having family members with these cancers may be associated with an increased risk for harmful changes (mutations) in the breast  cancer genes BRCA1 and BRCA2. Results of the assessment will determine the need for genetic counseling and BRCA1 and BRCA2 testing.  Your health care provider may recommend that you be screened regularly for cancer of the pelvic organs (ovaries, uterus, and vagina). This screening involves a pelvic examination, including checking for microscopic changes to the surface of your cervix (Pap test). You may be encouraged to have this screening done every 3 years, beginning at age 78.  For women ages 79-65, health care providers may recommend pelvic exams and Pap testing every 3 years, or they may recommend the Pap and pelvic exam, combined with testing for human papilloma virus (HPV), every 5 years. Some types of HPV increase your risk of cervical cancer. Testing for HPV may also be done on women of any age with unclear Pap test results.  Other health care providers may not recommend any screening for nonpregnant women who are considered low risk for pelvic cancer and who do not have symptoms. Ask your health care provider if a screening pelvic exam is right for  you.  If you have had past treatment for cervical cancer or a condition that could lead to cancer, you need Pap tests and screening for cancer for at least 20 years after your treatment. If Pap tests have been discontinued, your risk factors (such as having a new sexual partner) need to be reassessed to determine if screening should resume. Some women have medical problems that increase the chance of getting cervical cancer. In these cases, your health care provider may recommend more frequent screening and Pap tests.  Colorectal cancer can be detected and often prevented. Most routine colorectal cancer screening begins at the age of 50 years and continues through age 75 years. However, your health care provider may recommend screening at an earlier age if you have risk factors for colon cancer. On a yearly basis, your health care provider may provide  home test kits to check for hidden blood in the stool. Use of a small camera at the end of a tube, to directly examine the colon (sigmoidoscopy or colonoscopy), can detect the earliest forms of colorectal cancer. Talk to your health care provider about this at age 50, when routine screening begins. Direct exam of the colon should be repeated every 5-10 years through age 75 years, unless early forms of precancerous polyps or small growths are found.  People who are at an increased risk for hepatitis B should be screened for this virus. You are considered at high risk for hepatitis B if:  You were born in a country where hepatitis B occurs often. Talk with your health care provider about which countries are considered high risk.  Your parents were born in a high-risk country and you have not received a shot to protect against hepatitis B (hepatitis B vaccine).  You have HIV or AIDS.  You use needles to inject street drugs.  You live with, or have sex with, someone who has hepatitis B.  You get hemodialysis treatment.  You take certain medicines for conditions like cancer, organ transplantation, and autoimmune conditions.  Hepatitis C blood testing is recommended for all people born from 1945 through 1965 and any individual with known risks for hepatitis C.  Practice safe sex. Use condoms and avoid high-risk sexual practices to reduce the spread of sexually transmitted infections (STIs). STIs include gonorrhea, chlamydia, syphilis, trichomonas, herpes, HPV, and human immunodeficiency virus (HIV). Herpes, HIV, and HPV are viral illnesses that have no cure. They can result in disability, cancer, and death.  You should be screened for sexually transmitted illnesses (STIs) including gonorrhea and chlamydia if:  You are sexually active and are younger than 24 years.  You are older than 24 years and your health care provider tells you that you are at risk for this type of infection.  Your sexual  activity has changed since you were last screened and you are at an increased risk for chlamydia or gonorrhea. Ask your health care provider if you are at risk.  If you are at risk of being infected with HIV, it is recommended that you take a prescription medicine daily to prevent HIV infection. This is called preexposure prophylaxis (PrEP). You are considered at risk if:  You are sexually active and do not regularly use condoms or know the HIV status of your partner(s).  You take drugs by injection.  You are sexually active with a partner who has HIV.  Talk with your health care provider about whether you are at high risk of being infected with HIV. If   you choose to begin PrEP, you should first be tested for HIV. You should then be tested every 3 months for as long as you are taking PrEP.  Osteoporosis is a disease in which the bones lose minerals and strength with aging. This can result in serious bone fractures or breaks. The risk of osteoporosis can be identified using a bone density scan. Women ages 1 years and over and women at risk for fractures or osteoporosis should discuss screening with their health care providers. Ask your health care provider whether you should take a calcium supplement or vitamin D to reduce the rate of osteoporosis.  Menopause can be associated with physical symptoms and risks. Hormone replacement therapy is available to decrease symptoms and risks. You should talk to your health care provider about whether hormone replacement therapy is right for you.  Use sunscreen. Apply sunscreen liberally and repeatedly throughout the day. You should seek shade when your shadow is shorter than you. Protect yourself by wearing long sleeves, pants, a wide-brimmed hat, and sunglasses year round, whenever you are outdoors.  Once a month, do a whole body skin exam, using a mirror to look at the skin on your back. Tell your health care provider of new moles, moles that have irregular  borders, moles that are larger than a pencil eraser, or moles that have changed in shape or color.  Stay current with required vaccines (immunizations).  Influenza vaccine. All adults should be immunized every year.  Tetanus, diphtheria, and acellular pertussis (Td, Tdap) vaccine. Pregnant women should receive 1 dose of Tdap vaccine during each pregnancy. The dose should be obtained regardless of the length of time since the last dose. Immunization is preferred during the 27th-36th week of gestation. An adult who has not previously received Tdap or who does not know her vaccine status should receive 1 dose of Tdap. This initial dose should be followed by tetanus and diphtheria toxoids (Td) booster doses every 10 years. Adults with an unknown or incomplete history of completing a 3-dose immunization series with Td-containing vaccines should begin or complete a primary immunization series including a Tdap dose. Adults should receive a Td booster every 10 years.  Varicella vaccine. An adult without evidence of immunity to varicella should receive 2 doses or a second dose if she has previously received 1 dose. Pregnant females who do not have evidence of immunity should receive the first dose after pregnancy. This first dose should be obtained before leaving the health care facility. The second dose should be obtained 4-8 weeks after the first dose.  Human papillomavirus (HPV) vaccine. Females aged 13-26 years who have not received the vaccine previously should obtain the 3-dose series. The vaccine is not recommended for use in pregnant females. However, pregnancy testing is not needed before receiving a dose. If a female is found to be pregnant after receiving a dose, no treatment is needed. In that case, the remaining doses should be delayed until after the pregnancy. Immunization is recommended for any person with an immunocompromised condition through the age of 24 years if she did not get any or all doses  earlier. During the 3-dose series, the second dose should be obtained 4-8 weeks after the first dose. The third dose should be obtained 24 weeks after the first dose and 16 weeks after the second dose.  Zoster vaccine. One dose is recommended for adults aged 97 years or older unless certain conditions are present.  Measles, mumps, and rubella (MMR) vaccine. Adults born  before 1957 generally are considered immune to measles and mumps. Adults born in [birth year] or later should have 1 or more doses of MMR vaccine unless there is a contraindication to the vaccine or there is laboratory evidence of immunity to each of the three diseases. A routine second dose of MMR vaccine should be obtained at least 28 days after the first dose for students attending postsecondary schools, health care workers, or international travelers. People who received inactivated measles vaccine or an unknown type of measles vaccine during 1963-1967 should receive 2 doses of MMR vaccine. People who received inactivated mumps vaccine or an unknown type of mumps vaccine before 1979 and are at high risk for mumps infection should consider immunization with 2 doses of MMR vaccine. For females of childbearing age, rubella immunity should be determined. If there is no evidence of immunity, females who are not pregnant should be vaccinated. If there is no evidence of immunity, females who are pregnant should delay immunization until after pregnancy. Unvaccinated health care workers born before 60 who lack laboratory evidence of measles, mumps, or rubella immunity or laboratory confirmation of disease should consider measles and mumps immunization with 2 doses of MMR vaccine or rubella immunization with 1 dose of MMR vaccine.  Pneumococcal 13-valent conjugate (PCV13) vaccine. When indicated, a person who is uncertain of his immunization history and has no record of immunization should receive the PCV13 vaccine. All adults 61 years of age and older  should receive this vaccine. An adult aged 92 years or older who has certain medical conditions and has not been previously immunized should receive 1 dose of PCV13 vaccine. This PCV13 should be followed with a dose of pneumococcal polysaccharide (PPSV23) vaccine. Adults who are at high risk for pneumococcal disease should obtain the PPSV23 vaccine at least 8 weeks after the dose of PCV13 vaccine. Adults older than 35 years of age who have normal immune system function should obtain the PPSV23 vaccine dose at least 1 year after the dose of PCV13 vaccine.  Pneumococcal polysaccharide (PPSV23) vaccine. When PCV13 is also indicated, PCV13 should be obtained first. All adults aged 2 years and older should be immunized. An adult younger than age 30 years who has certain medical conditions should be immunized. Any person who resides in a nursing home or long-term care facility should be immunized. An adult smoker should be immunized. People with an immunocompromised condition and certain other conditions should receive both PCV13 and PPSV23 vaccines. People with human immunodeficiency virus (HIV) infection should be immunized as soon as possible after diagnosis. Immunization during chemotherapy or radiation therapy should be avoided. Routine use of PPSV23 vaccine is not recommended for American Indians, [place] Natives, or people younger than 65 years unless there are medical conditions that require PPSV23 vaccine. When indicated, people who have unknown immunization and have no record of immunization should receive PPSV23 vaccine. One-time revaccination 5 years after the first dose of PPSV23 is recommended for people aged 19-64 years who have chronic kidney failure, nephrotic syndrome, asplenia, or immunocompromised conditions. People who received 1-2 doses of PPSV23 before age 44 years should receive another dose of PPSV23 vaccine at age 83 years or later if at least 5 years have passed since the previous dose. Doses  of PPSV23 are not needed for people immunized with PPSV23 at or after age 20 years.  Meningococcal vaccine. Adults with asplenia or persistent complement component deficiencies should receive 2 doses of quadrivalent meningococcal conjugate (MenACWY-D) vaccine. The doses should be obtained  at least 2 months apart. Microbiologists working with certain meningococcal bacteria, [place] recruits, people at risk during an outbreak, and people who travel to or live in countries with a high rate of meningitis should be immunized. A first-year college student up through age 28 years who is living in a residence hall should receive a dose if she did not receive a dose on or after her 16th birthday. Adults who have certain high-risk conditions should receive one or more doses of vaccine.  Hepatitis A vaccine. Adults who wish to be protected from this disease, have certain high-risk conditions, work with hepatitis A-infected animals, work in hepatitis A research labs, or travel to or work in countries with a high rate of hepatitis A should be immunized. Adults who were previously unvaccinated and who anticipate close contact with an international adoptee during the first 60 days after arrival in the [place] from a country with a high rate of hepatitis A should be immunized.  Hepatitis B vaccine. Adults who wish to be protected from this disease, have certain high-risk conditions, may be exposed to blood or other infectious body fluids, are household contacts or sex partners of hepatitis B positive people, are clients or workers in certain care facilities, or travel to or work in countries with a high rate of hepatitis B should be immunized.  Haemophilus influenzae type b (Hib) vaccine. A previously unvaccinated person with asplenia or sickle cell disease or having a scheduled splenectomy should receive 1 dose of Hib vaccine. Regardless of previous immunization, a recipient of a hematopoietic stem cell transplant  should receive a 3-dose series 6-12 months after her successful transplant. Hib vaccine is not recommended for adults with HIV infection. Preventive Services / Frequency Ages 71 to 87 years  Blood pressure check.** / Every 3-5 years.  Lipid and cholesterol check.** / Every 5 years beginning at age 1.  Clinical breast exam.** / Every 3 years for women in their 3s and 31s.  BRCA-related cancer risk assessment.** / For women who have family members with a BRCA-related cancer (breast, ovarian, tubal, or peritoneal cancers).  Pap test.** / Every 2 years from ages 50 through 86. Every 3 years starting at age 87 through age 7 or 75 with a history of 3 consecutive normal Pap tests.  HPV screening.** / Every 3 years from ages 59 through ages 35 to 6 with a history of 3 consecutive normal Pap tests.  Hepatitis C blood test.** / For any individual with known risks for hepatitis C.  Skin self-exam. / Monthly.  Influenza vaccine. / Every year.  Tetanus, diphtheria, and acellular pertussis (Tdap, Td) vaccine.** / Consult your health care provider. Pregnant women should receive 1 dose of Tdap vaccine during each pregnancy. 1 dose of Td every 10 years.  Varicella vaccine.** / Consult your health care provider. Pregnant females who do not have evidence of immunity should receive the first dose after pregnancy.  HPV vaccine. / 3 doses over 6 months, if 72 and younger. The vaccine is not recommended for use in pregnant females. However, pregnancy testing is not needed before receiving a dose.  Measles, mumps, rubella (MMR) vaccine.** / You need at least 1 dose of MMR if you were born in 1957 or later. You may also need a 2nd dose. For females of childbearing age, rubella immunity should be determined. If there is no evidence of immunity, females who are not pregnant should be vaccinated. If there is no evidence of immunity, females who are  pregnant should delay immunization until after  pregnancy.  Pneumococcal 13-valent conjugate (PCV13) vaccine.** / Consult your health care provider.  Pneumococcal polysaccharide (PPSV23) vaccine.** / 1 to 2 doses if you smoke cigarettes or if you have certain conditions.  Meningococcal vaccine.** / 1 dose if you are age 87 to 44 years and a Market researcher living in a residence hall, or have one of several medical conditions, you need to get vaccinated against meningococcal disease. You may also need additional booster doses.  Hepatitis A vaccine.** / Consult your health care provider.  Hepatitis B vaccine.** / Consult your health care provider.  Haemophilus influenzae type b (Hib) vaccine.** / Consult your health care provider. Ages 86 to 38 years  Blood pressure check.** / Every year.  Lipid and cholesterol check.** / Every 5 years beginning at age 49 years.  Lung cancer screening. / Every year if you are aged 71-80 years and have a 30-pack-year history of smoking and currently smoke or have quit within the past 15 years. Yearly screening is stopped once you have quit smoking for at least 15 years or develop a health problem that would prevent you from having lung cancer treatment.  Clinical breast exam.** / Every year after age 51 years.  BRCA-related cancer risk assessment.** / For women who have family members with a BRCA-related cancer (breast, ovarian, tubal, or peritoneal cancers).  Mammogram.** / Every year beginning at age 18 years and continuing for as long as you are in good health. Consult with your health care provider.  Pap test.** / Every 3 years starting at age 63 years through age 37 or 57 years with a history of 3 consecutive normal Pap tests.  HPV screening.** / Every 3 years from ages 41 years through ages 76 to 23 years with a history of 3 consecutive normal Pap tests.  Fecal occult blood test (FOBT) of stool. / Every year beginning at age 36 years and continuing until age 51 years. You may not need  to do this test if you get a colonoscopy every 10 years.  Flexible sigmoidoscopy or colonoscopy.** / Every 5 years for a flexible sigmoidoscopy or every 10 years for a colonoscopy beginning at age 36 years and continuing until age 35 years.  Hepatitis C blood test.** / For all people born from [birth year] through 1965 and any individual with known risks for hepatitis C.  Skin self-exam. / Monthly.  Influenza vaccine. / Every year.  Tetanus, diphtheria, and acellular pertussis (Tdap/Td) vaccine.** / Consult your health care provider. Pregnant women should receive 1 dose of Tdap vaccine during each pregnancy. 1 dose of Td every 10 years.  Varicella vaccine.** / Consult your health care provider. Pregnant females who do not have evidence of immunity should receive the first dose after pregnancy.  Zoster vaccine.** / 1 dose for adults aged 73 years or older.  Measles, mumps, rubella (MMR) vaccine.** / You need at least 1 dose of MMR if you were born in 1957 or later. You may also need a second dose. For females of childbearing age, rubella immunity should be determined. If there is no evidence of immunity, females who are not pregnant should be vaccinated. If there is no evidence of immunity, females who are pregnant should delay immunization until after pregnancy.  Pneumococcal 13-valent conjugate (PCV13) vaccine.** / Consult your health care provider.  Pneumococcal polysaccharide (PPSV23) vaccine.** / 1 to 2 doses if you smoke cigarettes or if you have certain conditions.  Meningococcal vaccine.** /  Consult your health care provider.  Hepatitis A vaccine.** / Consult your health care provider.  Hepatitis B vaccine.** / Consult your health care provider.  Haemophilus influenzae type b (Hib) vaccine.** / Consult your health care provider. Ages 80 years and over  Blood pressure check.** / Every year.  Lipid and cholesterol check.** / Every 5 years beginning at age 62 years.  Lung cancer  screening. / Every year if you are aged 32-80 years and have a 30-pack-year history of smoking and currently smoke or have quit within the past 15 years. Yearly screening is stopped once you have quit smoking for at least 15 years or develop a health problem that would prevent you from having lung cancer treatment.  Clinical breast exam.** / Every year after age 61 years.  BRCA-related cancer risk assessment.** / For women who have family members with a BRCA-related cancer (breast, ovarian, tubal, or peritoneal cancers).  Mammogram.** / Every year beginning at age 39 years and continuing for as long as you are in good health. Consult with your health care provider.  Pap test.** / Every 3 years starting at age 85 years through age 74 or 72 years with 3 consecutive normal Pap tests. Testing can be stopped between 65 and 70 years with 3 consecutive normal Pap tests and no abnormal Pap or HPV tests in the past 10 years.  HPV screening.** / Every 3 years from ages 55 years through ages 67 or 77 years with a history of 3 consecutive normal Pap tests. Testing can be stopped between 65 and 70 years with 3 consecutive normal Pap tests and no abnormal Pap or HPV tests in the past 10 years.  Fecal occult blood test (FOBT) of stool. / Every year beginning at age 81 years and continuing until age 22 years. You may not need to do this test if you get a colonoscopy every 10 years.  Flexible sigmoidoscopy or colonoscopy.** / Every 5 years for a flexible sigmoidoscopy or every 10 years for a colonoscopy beginning at age 67 years and continuing until age 22 years.  Hepatitis C blood test.** / For all people born from [birth year] through 1965 and any individual with known risks for hepatitis C.  Osteoporosis screening.** / A one-time screening for women ages 8 years and over and women at risk for fractures or osteoporosis.  Skin self-exam. / Monthly.  Influenza vaccine. / Every year.  Tetanus, diphtheria, and  acellular pertussis (Tdap/Td) vaccine.** / 1 dose of Td every 10 years.  Varicella vaccine.** / Consult your health care provider.  Zoster vaccine.** / 1 dose for adults aged 56 years or older.  Pneumococcal 13-valent conjugate (PCV13) vaccine.** / Consult your health care provider.  Pneumococcal polysaccharide (PPSV23) vaccine.** / 1 dose for all adults aged 15 years and older.  Meningococcal vaccine.** / Consult your health care provider.  Hepatitis A vaccine.** / Consult your health care provider.  Hepatitis B vaccine.** / Consult your health care provider.  Haemophilus influenzae type b (Hib) vaccine.** / Consult your health care provider. ** Family history and personal history of risk and conditions may change your health care provider's recommendations.   This information is not intended to replace advice given to you by your health care provider. Make sure you discuss any questions you have with your health care provider.   Document Released: 09/16/2001 Document Revised: 08/11/2014 Document Reviewed: 12/16/2010 Elsevier Interactive Patient Education [employer].

## 2016-05-19 ENCOUNTER — Encounter: Payer: Self-pay | Admitting: Obstetrics and Gynecology

## 2016-05-19 DIAGNOSIS — K648 Other hemorrhoids: Secondary | ICD-10-CM | POA: Diagnosis not present

## 2016-05-19 LAB — CYTOLOGY - PAP

## 2016-05-21 DIAGNOSIS — M4716 Other spondylosis with myelopathy, lumbar region: Secondary | ICD-10-CM | POA: Diagnosis not present

## 2016-05-21 DIAGNOSIS — G588 Other specified mononeuropathies: Secondary | ICD-10-CM | POA: Diagnosis not present

## 2016-06-02 DIAGNOSIS — M47816 Spondylosis without myelopathy or radiculopathy, lumbar region: Secondary | ICD-10-CM | POA: Diagnosis not present

## 2016-06-02 DIAGNOSIS — M5418 Radiculopathy, sacral and sacrococcygeal region: Secondary | ICD-10-CM | POA: Diagnosis not present

## 2016-06-02 DIAGNOSIS — Z87891 Personal history of nicotine dependence: Secondary | ICD-10-CM | POA: Diagnosis not present

## 2016-06-02 DIAGNOSIS — G588 Other specified mononeuropathies: Secondary | ICD-10-CM | POA: Diagnosis not present

## 2016-06-02 DIAGNOSIS — Z79899 Other long term (current) drug therapy: Secondary | ICD-10-CM | POA: Diagnosis not present

## 2016-06-04 DIAGNOSIS — K648 Other hemorrhoids: Secondary | ICD-10-CM | POA: Diagnosis not present

## 2016-06-11 ENCOUNTER — Encounter: Payer: Self-pay | Admitting: Obstetrics and Gynecology

## 2016-06-11 ENCOUNTER — Ambulatory Visit (INDEPENDENT_AMBULATORY_CARE_PROVIDER_SITE_OTHER): Payer: BLUE CROSS/BLUE SHIELD | Admitting: Obstetrics and Gynecology

## 2016-06-11 VITALS — BP 116/81 | HR 100 | Ht 66.0 in | Wt 137.5 lb

## 2016-06-11 DIAGNOSIS — K625 Hemorrhage of anus and rectum: Secondary | ICD-10-CM | POA: Insufficient documentation

## 2016-06-11 DIAGNOSIS — G588 Other specified mononeuropathies: Secondary | ICD-10-CM | POA: Insufficient documentation

## 2016-06-11 DIAGNOSIS — K649 Unspecified hemorrhoids: Secondary | ICD-10-CM | POA: Diagnosis not present

## 2016-06-11 DIAGNOSIS — N761 Subacute and chronic vaginitis: Secondary | ICD-10-CM

## 2016-06-11 DIAGNOSIS — N816 Rectocele: Secondary | ICD-10-CM | POA: Insufficient documentation

## 2016-06-11 DIAGNOSIS — Z90710 Acquired absence of both cervix and uterus: Secondary | ICD-10-CM | POA: Insufficient documentation

## 2016-06-11 DIAGNOSIS — K5909 Other constipation: Secondary | ICD-10-CM | POA: Diagnosis not present

## 2016-06-11 NOTE — Patient Instructions (Addendum)
1. Continue with Colace twice a day 2. Continue with increase water intake 3. Increase dietary fiber 4. Continue with MiraLAX weekly 5. Return in 1 week for pessary trial  About Rectocele  Overview  A rectocele is a type of hernia which causes different degrees of bulging of the rectal tissues into the vaginal wall.  You may even notice that it presses against the vaginal wall so much that some vaginal tissues droop outside of the opening of your vagina.  Causes of Rectocele  The most common cause is childbirth.  The muscles and ligaments in the pelvis that hold up and support the female organs and vagina become stretched and weakened during labor and delivery.  The more babies you have, the more the support tissues are stretched and weakened.  Not everyone who has a baby will develop a rectocele.  Some women have stronger supporting tissue in the pelvis and may not have as much of a problem as others.  Women who have a Cesarean section usually do not get rectocele's unless they pushed a long time prior to the cesarean delivery.  Other conditions that can cause a rectocele include chronic constipation, a chronic cough, a lot of heavy lifting, and obesity.  Older women may have this problem because the loss of female hormones causes the vaginal tissue to become weaker.  Symptoms  There may not be any symptoms.  If you do have symptoms, they may include:  Pelvic pressure in the rectal area  Protrusion of the lower part of the vagina through the opening of the vagina  Constipation and trapping of the stool, making it difficult to have a bowel movement.  In severe cases, you may have to press on the lower part of your vagina to help push the stool out of you rectum.  This is called splinting to empty.  Diagnosing Rectocele  Your health care provider will ask about your symptoms and perform a pelvic exam.  S/he will ask you to bear down, pushing like you are having a bowel movement so as to  see how far the lower part of the vagina protrudes into the vagina and possible outside of the vagina.  Your provider will also ask you to contract the muscles of your pelvis (like you are stopping the stream in the middle of urinating) to determine the strength of your pelvic muscles.  Your provider may also do a rectal exam.  Treatment Options  If you do not have any symptoms, no treatment may be necessary.  Other treatment options include:  Pelvic floor exercises: Contracting the muscles in your genital area may help strengthen your muscles and support the organs.  Be sure to get proper exercise instruction from you physical therapist.  A pessary (removealbe pelvic support device) sometimes helps rectocele symptoms.  Surgery: Surgical repair may be necessary. In some cases the uterus may need to be taken out ( a hysterectomy) as well.  There are many types of surgery for pelvic support problems.  Look for physicians who specialize in repair procedures.  You can take care of yourself by:  Treating and preventing constipation  Avoiding heavy lifting, and lifting correctly (with your legs, not with you waist or back)  Treating a chronic cough or bronchitis  Not smoking  avoiding too much weight gain  Doing pelvic floor exercises   2007, [employer] Doc.33

## 2016-06-11 NOTE — Addendum Note (Signed)
Addended by: Earl Lagos on: 06/11/2016 12:13 PM   Modules accepted: Orders

## 2016-06-11 NOTE — Progress Notes (Signed)
GYN ENCOUNTER NOTE  Subjective:       Sonia Smith is a 35 y.o. G104P2002 female is here for gynecologic evaluation of the following issues:  1.  Rectocele 2. Chronic vaginitis  Status post TAH for endometriosis in 2008. History of pudendal neuralgia with multiple pelvic complaints status post vaginal delivery 13 years ago. Patient takes Colace twice a and uses MiraLAX once a week; bowel movement frequency is once a week; bowel movements are formed. She does have to splint with bowel movements in order to defecate. She does occasionally have incomplete emptying of her bowel.  Patient also experiences cyclic chronic vulvar and vaginal burning associated with a white discharge; typically Monistat treatment will help resolve the symptoms. She has not had any recent treatment for this condition. I have not seen any fasting blood sugar assessment or hemoglobin A1c assessment in her lab review.   Gynecologic History No LMP recorded. Patient has had a hysterectomy. Contraception: status post hysterectomy  Pap smear: Negative 05/16/2016  Obstetric History OB History  Gravida Para Term Preterm AB Living  2 2 2  0 0 2  SAB TAB Ectopic Multiple Live Births  0 0 0 0 2    # Outcome Date GA Lbr Len/2nd Weight Sex Delivery Anes PTL Lv  2 Term 2004 [redacted]w[redacted]d  7 lb (3.175 kg) M Vag-Spont  N LIV  1 Term 2002 [redacted]w[redacted]d  7 lb 8 oz (3.402 kg) F Vag-Spont  Y LIV      Past Medical History:  Diagnosis Date  . Blood in stool   . 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 Depression    in high school  . GERD (gastroesophageal reflux disease)   . Lupus    tested positive many years ago, most recent came back negative.  . Migraines   . Painful intercourse   . Pudendal neuralgia   . Vaginal Pap smear, abnormal     Past Surgical History:  Procedure Laterality Date  . ABDOMINAL HYSTERECTOMY  2008  . 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 CERVICAL CERCLAGE  2001, 2004  .  HEMORRHOID SURGERY N/A 11/06/2015   Procedure: HEMORRHOIDECTOMY;  Surgeon: Leonie Green, MD;  Location: [facility] ORS;  Service: General;  Laterality: N/A;  . LEEP  1998   dysplasia    Current Outpatient Prescriptions on File Prior to Visit  Medication Sig Dispense Refill  . gabapentin (NEURONTIN) 600 MG tablet Take 2 tablets by mouth 3 (three) times daily.    Marland Kitchen ibuprofen (ADVIL,MOTRIN) 200 MG tablet Take 400 mg by mouth every 6 (six) hours as needed for fever, headache or mild pain.    Marland Kitchen imipramine (TOFRANIL) 25 MG tablet Take 25 mg by mouth at bedtime.     No current facility-administered medications on file prior to visit.     Allergies  Allergen Reactions  . Estradiol Swelling and Other (See Comments)    Skin peels off  . Adhesive [Tape] Rash    From bandaides. Tape is OK.    Social History   Social History  . Marital status: Married    Spouse name: N/A  . Number of children: N/A  . Years of education: N/A   Occupational History  . Not on file.   Social History Main Topics  . Smoking status: Former Smoker    Quit date: 10/08/1999  . Smokeless tobacco: Never Used     Comment: quit 2001  .  Alcohol use No  . Drug use: No  . Sexual activity: Yes    Birth control/ protection: Surgical   Other Topics Concern  . Not on file   Social History Narrative  . No narrative on file    Family History  Problem Relation Age of Onset  . Diabetes Mother   . Hypertension Mother   . Cancer Father     lung  . Diabetes Father   . Hypertension Father   . Heart disease Maternal Grandmother   . Heart disease Maternal Grandfather   . Cancer Paternal Grandfather   . Stroke Neg Hx     The following portions of the patient's history were reviewed and updated as appropriate: allergies, current medications, past family history, past medical history, past social history, past surgical history and problem list.  Review of Systems Review of Systems - Per history of present  illness  Objective:   BP 116/81   Pulse 100   Ht 5\' 6"  (1.676 m)   Wt 137 lb 8 oz (62.4 kg)   BMI 22.19 kg/m  CONSTITUTIONAL: Well-developed, well-nourished female in no acute distress.  HENT:  Normocephalic, atraumatic.  NECK: Normal range of motion, supple, no masses.  Normal thyroid.  SKIN: Skin is warm and dry. No rash noted. Not diaphoretic. No erythema. No pallor. [place]: Alert and oriented to person, place, and time. PSYCHIATRIC: Normal mood and affect. Normal behavior. Normal judgment and thought content. CARDIOVASCULAR:Not Examined RESPIRATORY: Not Examined BREASTS: Not Examined ABDOMEN: Soft, non distended; Non tender.  No Organomegaly. PELVIC:  External Genitalia: Normal  BUS: Normal  Vagina: NormalMucosa; first-degree cystocele with Valsalva; Good apical support; small distal rectocele; milky white secretions present, no malodor  Cervix: Surgically absent  Uterus: Surgically absent  Adnexa: Normal; nonpalpable and nontender  RV: Normal external exam  Bladder: Nontender MUSCULOSKELETAL: Normal range of motion. No tenderness.  No cyanosis, clubbing, or edema.     Assessment:   1. Rectocele,Small, symptomatic  2. Chronic constipation  3. Hemorrhoids, unspecified hemorrhoid type status post banding  4. Chronic vaginitis  5. Status post abdominal hysterectomy; history of endometriosis   6. Pudendal neuralgia  Plan:   1. Nu swab 2. Return in 1 week for pessary trial 3. Continue with Colace twice a day 4. Continue with increased water intake 5. Increase dietary fiber 6. Continue with MiraLAX weekly 7. If yeast returns on the swab, we will recommend weekly Diflucan for 12 weeks  A total of 25 minutes were spent face-to-face with the patient during this encounter and over half of that time involved counseling and coordination of care.  Brayton Mars, MD  Note: This dictation was prepared with Dragon dictation along with smaller phrase  technology. Any transcriptional errors that result from this process are unintentional.

## 2016-06-14 LAB — NUSWAB BV AND CANDIDA, NAA
Candida albicans, NAA: NEGATIVE
Candida glabrata, NAA: NEGATIVE

## 2016-06-15 ENCOUNTER — Other Ambulatory Visit: Payer: Self-pay | Admitting: Obstetrics and Gynecology

## 2016-06-17 ENCOUNTER — Ambulatory Visit (INDEPENDENT_AMBULATORY_CARE_PROVIDER_SITE_OTHER): Payer: BLUE CROSS/BLUE SHIELD | Admitting: Obstetrics and Gynecology

## 2016-06-17 VITALS — BP 106/75 | HR 110 | Ht 66.0 in | Wt 135.9 lb

## 2016-06-17 DIAGNOSIS — N816 Rectocele: Secondary | ICD-10-CM | POA: Diagnosis not present

## 2016-06-17 DIAGNOSIS — K5909 Other constipation: Secondary | ICD-10-CM | POA: Diagnosis not present

## 2016-06-17 MED ORDER — ESTRADIOL 0.1 MG/GM VA CREA: 0.5000 | TOPICAL_CREAM | VAGINAL | 3 refills | Status: DC

## 2016-06-17 NOTE — Progress Notes (Signed)
Chief complaint: 1. Symptomatic rectocele 2. Pessary fitting  OBJECTIVE: BP 106/75   Pulse (!) 110   Ht 5\' 6"  (1.676 m)   Wt 135 lb 14.4 oz (61.6 kg)   BMI 21.93 kg/m  PELVIC:             External Genitalia: Normal             BUS: Normal             Vagina: NormalMucosa; first-degree cystocele with Valsalva; Good apical support; small distal rectocele             Cervix: Surgically absent             Uterus: Surgically absent             Adnexa: Normal; nonpalpable and nontender             RV: Normal external exam             Bladder: Nontender  Procedure: Pessary fitting  #2 donut pessary-successful  ASSESSMENT: 1. Symptomatic rectocele 2. Successful pessary fitting  PLAN: 1. #2 donut pessary is ordered 2. She will be contacted for insertion when pessary arrives  Brayton Mars, MD  Note: This dictation was prepared with Dragon dictation along with smaller phrase technology. Any transcriptional errors that result from this process are unintentional.

## 2016-06-17 NOTE — Patient Instructions (Addendum)
1. Patient will be contacted when pessary arrives for insertion. #2 donut pessary is ordered

## 2016-06-18 DIAGNOSIS — M549 Dorsalgia, unspecified: Secondary | ICD-10-CM | POA: Diagnosis not present

## 2016-06-18 DIAGNOSIS — G588 Other specified mononeuropathies: Secondary | ICD-10-CM | POA: Diagnosis not present

## 2016-06-18 DIAGNOSIS — Z79899 Other long term (current) drug therapy: Secondary | ICD-10-CM | POA: Diagnosis not present

## 2016-06-18 DIAGNOSIS — Z5181 Encounter for therapeutic drug level monitoring: Secondary | ICD-10-CM | POA: Diagnosis not present

## 2016-06-18 DIAGNOSIS — R3989 Other symptoms and signs involving the genitourinary system: Secondary | ICD-10-CM | POA: Diagnosis not present

## 2016-07-01 ENCOUNTER — Encounter: Payer: Self-pay | Admitting: Obstetrics and Gynecology

## 2016-07-03 DIAGNOSIS — G588 Other specified mononeuropathies: Secondary | ICD-10-CM | POA: Diagnosis not present

## 2016-07-03 DIAGNOSIS — Z09 Encounter for follow-up examination after completed treatment for conditions other than malignant neoplasm: Secondary | ICD-10-CM | POA: Insufficient documentation

## 2016-07-03 DIAGNOSIS — N3010 Interstitial cystitis (chronic) without hematuria: Secondary | ICD-10-CM | POA: Diagnosis not present

## 2016-07-03 DIAGNOSIS — R102 Pelvic and perineal pain: Secondary | ICD-10-CM | POA: Diagnosis not present

## 2016-07-15 ENCOUNTER — Encounter: Payer: Self-pay | Admitting: Obstetrics and Gynecology

## 2016-07-16 DIAGNOSIS — R419 Unspecified symptoms and signs involving cognitive functions and awareness: Secondary | ICD-10-CM | POA: Diagnosis not present

## 2016-07-16 DIAGNOSIS — G4700 Insomnia, unspecified: Secondary | ICD-10-CM | POA: Diagnosis not present

## 2016-07-16 DIAGNOSIS — F4320 Adjustment disorder, unspecified: Secondary | ICD-10-CM | POA: Diagnosis not present

## 2016-07-16 DIAGNOSIS — F0634 Mood disorder due to known physiological condition with mixed features: Secondary | ICD-10-CM | POA: Diagnosis not present

## 2016-07-17 ENCOUNTER — Encounter: Payer: Self-pay | Admitting: Internal Medicine

## 2016-07-17 ENCOUNTER — Ambulatory Visit (INDEPENDENT_AMBULATORY_CARE_PROVIDER_SITE_OTHER): Payer: BLUE CROSS/BLUE SHIELD | Admitting: Internal Medicine

## 2016-07-17 VITALS — BP 110/76 | HR 98 | Temp 98.0°F | Wt 135.0 lb

## 2016-07-17 DIAGNOSIS — R300 Dysuria: Secondary | ICD-10-CM | POA: Diagnosis not present

## 2016-07-17 DIAGNOSIS — R3989 Other symptoms and signs involving the genitourinary system: Secondary | ICD-10-CM

## 2016-07-17 DIAGNOSIS — Z113 Encounter for screening for infections with a predominantly sexual mode of transmission: Secondary | ICD-10-CM

## 2016-07-17 NOTE — Addendum Note (Signed)
Addended by: Ellamae Sia on: 07/17/2016 05:39 PM   Modules accepted: Orders

## 2016-07-17 NOTE — Addendum Note (Signed)
Addended by: Lurlean Nanny on: 07/17/2016 04:58 PM   Modules accepted: Orders

## 2016-07-17 NOTE — Patient Instructions (Signed)
Dysuria Introduction Dysuria is pain or discomfort while urinating. The pain or discomfort may be felt in the tube that carries urine out of the bladder (urethra) or in the surrounding tissue of the genitals. The pain may also be felt in the groin area, lower abdomen, and lower back. You may have to urinate frequently or have the sudden feeling that you have to urinate (urgency). Dysuria can affect both men and women, but is more common in women. Dysuria can be caused by many different things, including:  Urinary tract infection in women.  Infection of the kidney or bladder.  Kidney stones or bladder stones.  Certain sexually transmitted infections (STIs), such as chlamydia.  Dehydration.  Inflammation of the vagina.  Use of certain medicines.  Use of certain soaps or scented products that cause irritation. Follow these instructions at home: Watch your dysuria for any changes. The following actions may help to reduce any discomfort you are feeling:  Drink enough fluid to keep your urine clear or pale yellow.  Empty your bladder often. Avoid holding urine for long periods of time.  After a bowel movement or urination, women should cleanse from front to back, using each tissue only once.  Empty your bladder after sexual intercourse.  Take medicines only as directed by your health care provider.  If you were prescribed an antibiotic medicine, finish it all even if you start to feel better.  Avoid caffeine, tea, and alcohol. They can irritate the bladder and make dysuria worse. In men, alcohol may irritate the prostate.  Keep all follow-up visits as directed by your health care provider. This is important.  If you had any tests done to find the cause of dysuria, it is your responsibility to obtain your test results. Ask the lab or department performing the test when and how you will get your results. Talk with your health care provider if you have any questions about your  results. Contact a health care provider if:  You develop pain in your back or sides.  You have a fever.  You have nausea or vomiting.  You have blood in your urine.  You are not urinating as often as you usually do. Get help right away if:  You pain is severe and not relieved with medicines.  You are unable to hold down any fluids.  You or someone else notices a change in your mental function.  You have a rapid heartbeat at rest.  You have shaking or chills.  You feel extremely weak. This information is not intended to replace advice given to you by your health care provider. Make sure you discuss any questions you have with your health care provider. Document Released: 04/18/2004 Document Revised: 12/27/2015 Document Reviewed: 03/16/2014  2017 Elsevier  

## 2016-07-17 NOTE — Progress Notes (Signed)
Subjective:    Patient ID: Sonia Smith, female    DOB: [birth year], 35 y.o.   MRN: [id number]  HPI  Pt presents to the clinic today with c/o dysuria. This started 2 weeks ago after having a cystoscopy. She had the cystoscopy for ongoing evaluation of chronic urethral pain. She denies urgency, frequency, vaginal discharge, odor or lower abdominal pain. She is taking the Gabapentin, Imipramine, and Methylene Blue with minimal relief. She reports her urologist is planning to trial an internal neurotransmitter.   She would also like to be screened for STD's. She reports her husband admitted that he had an affair with another woman.   Review of Systems  Past Medical History:  Diagnosis Date  . Blood in stool   . 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 Depression    in high school  . GERD (gastroesophageal reflux disease)   . Lupus    tested positive many years ago, most recent came back negative.  . Migraines   . Painful intercourse   . Pudendal neuralgia   . Vaginal Pap smear, abnormal     Current Outpatient Prescriptions  Medication Sig Dispense Refill  . acetaminophen-codeine (TYLENOL #4) 300-60 MG tablet Take by mouth.    . docusate sodium (COLACE) 250 MG capsule Take by mouth.    . estradiol (ESTRACE VAGINAL) 0.1 MG/GM vaginal cream Place 0.5 Applicatorfuls vaginally 2 (two) times a week. 42.5 g 3  . fluconazole (DIFLUCAN) 150 MG tablet take 1 tablet by mouth ONCE. MAY TAKE AN ADDITIONAL DOSE 3 DAYS LATER IF SYMPTOMS PERSIST 3 tablet 3  . gabapentin (NEURONTIN) 600 MG tablet Take 2 tablets by mouth 3 (three) times daily.    Marland Kitchen ibuprofen (ADVIL,MOTRIN) 200 MG tablet Take 400 mg by mouth every 6 (six) hours as needed for fever, headache or mild pain.    Marland Kitchen imipramine (TOFRANIL) 25 MG tablet Take 25 mg by mouth at bedtime.    . Melatonin 3 MG TABS Take 6 mg by mouth.    . phenazopyridine (PYRIDIUM) 95 MG tablet Take by mouth.    . traMADol (ULTRAM) 50 MG tablet take 1  tablet by mouth every 6 hours if needed    . tretinoin microspheres (RETIN-A MICRO) 0.04 % gel Apply topically.     No current facility-administered medications for this visit.     Allergies  Allergen Reactions  . Estradiol Swelling and Other (See Comments)    Skin peels off  . Replens     Redness, skin peeling  . Adhesive [Tape] Rash    From bandaides. Tape is OK.    Family History  Problem Relation Age of Onset  . Diabetes Mother   . Hypertension Mother   . Cancer Father     lung  . Diabetes Father   . Hypertension Father   . Heart disease Maternal Grandmother   . Heart disease Maternal Grandfather   . Cancer Paternal Grandfather   . Stroke Neg Hx     Social History   Social History  . Marital status: Married    Spouse name: N/A  . Number of children: N/A  . Years of education: N/A   Occupational History  . Not on file.   Social History Main Topics  . Smoking status: Former Smoker    Quit date: 10/08/1999  . Smokeless tobacco: Never Used     Comment: quit 2001  . Alcohol use No  . Drug use: No  .  Sexual activity: Yes    Birth control/ protection: Surgical   Other Topics Concern  . Not on file   Social History Narrative  . No narrative on file     Constitutional: Denies fever, malaise, fatigue, headache or abrupt weight changes.  Gastrointestinal: Denies abdominal pain, bloating, constipation, diarrhea or blood in the stool.  GU: Pt reports dysuria. Denies urgency, frequency, blood in urine, odor or discharge.  No other specific complaints in a complete review of systems (except as listed in HPI above).     Objective:   Physical Exam  BP 110/76   Pulse 98   Temp 98 F (36.7 C) (Oral)   Wt 135 lb (61.2 kg)   SpO2 99%   BMI 21.79 kg/m  Wt Readings from Last 3 Encounters:  07/17/16 135 lb (61.2 kg)  06/17/16 135 lb 14.4 oz (61.6 kg)  06/11/16 137 lb 8 oz (62.4 kg)    General: Appears her stated age, well developed, well nourished in  NAD. Abdomen: Soft and nontender. No CVA tenderness noted.   BMET    Component Value Date/Time   NA 140 01/03/2016 1511   K 3.9 01/03/2016 1511   CL 104 01/03/2016 1511   CO2 32 01/03/2016 1511   GLUCOSE 88 01/03/2016 1511   BUN 7 01/03/2016 1511   CREATININE 0.84 01/03/2016 1511   CALCIUM 9.4 01/03/2016 1511   GFRNONAA >60 08/19/2015 0158   GFRAA >60 08/19/2015 0158    Lipid Panel     Component Value Date/Time   CHOL 210 (H) 01/03/2016 1511   TRIG 58.0 01/03/2016 1511   HDL 44.70 01/03/2016 1511   CHOLHDL 5 01/03/2016 1511   VLDL 11.6 01/03/2016 1511   LDLCALC 154 (H) 01/03/2016 1511    CBC    Component Value Date/Time   WBC 6.1 01/03/2016 1511   RBC 4.45 01/03/2016 1511   HGB 14.2 01/03/2016 1511   HCT 42.4 01/03/2016 1511   PLT 216.0 01/03/2016 1511   MCV 95.3 01/03/2016 1511   MCH 30.6 08/19/2015 0158   MCHC 33.5 01/03/2016 1511   RDW 12.7 01/03/2016 1511    Hgb A1C No results found for: HGBA1C          Assessment & Plan:   Dysuria:  History of chronic urethral pain Urinalysis: trace protein Will send urine culture Continue current meds at this time  Screen for STD:  Urine G/C sent today HIV, RPR, HSV and Hep C ordered today  RTC as needed or if symptoms persist or worsen. Webb Silversmith, NP

## 2016-07-18 LAB — POC URINALSYSI DIPSTICK (AUTOMATED)
Bilirubin, UA: NEGATIVE
Blood, UA: NEGATIVE
Glucose, UA: NEGATIVE
Ketones, UA: NEGATIVE
Leukocytes, UA: NEGATIVE
Nitrite, UA: NEGATIVE
Protein, UA: NEGATIVE
Spec Grav, UA: 1.025
Urobilinogen, UA: NEGATIVE
pH, UA: 6

## 2016-07-18 LAB — RPR

## 2016-07-18 LAB — GC/CHLAMYDIA PROBE AMP
CT Probe RNA: NOT DETECTED
GC Probe RNA: NOT DETECTED

## 2016-07-18 LAB — HIV ANTIBODY (ROUTINE TESTING W REFLEX): HIV 1&2 Ab, 4th Generation: NONREACTIVE

## 2016-07-18 LAB — HEPATITIS C ANTIBODY: HCV Ab: NEGATIVE

## 2016-07-20 LAB — URINE CULTURE

## 2016-07-21 ENCOUNTER — Encounter: Payer: Self-pay | Admitting: Internal Medicine

## 2016-07-21 LAB — HSV TYPE I/II IGG, IGMW/ REFLEX
HSV 1 Glycoprotein G Ab, IgG: 25.2 Index — ABNORMAL HIGH (ref <0.90)
HSV 1 IgM Screen: NEGATIVE
HSV 2 Glycoprotein G Ab, IgG: 10.4 Index — ABNORMAL HIGH (ref <0.90)
HSV 2 IgM Screen: NEGATIVE

## 2016-08-07 ENCOUNTER — Encounter: Payer: Self-pay | Admitting: Obstetrics and Gynecology

## 2016-08-14 ENCOUNTER — Ambulatory Visit: Payer: Self-pay | Admitting: Internal Medicine

## 2016-08-18 ENCOUNTER — Telehealth: Payer: Self-pay | Admitting: Internal Medicine

## 2016-08-18 ENCOUNTER — Ambulatory Visit: Payer: Self-pay | Admitting: Internal Medicine

## 2016-08-18 NOTE — Telephone Encounter (Signed)
Patient did not come in for their appointment today for vaginal pain .Please let me know if patient needs to be contacted immediately for follow up or no follow up needed.

## 2016-08-18 NOTE — Telephone Encounter (Signed)
No follow up needed

## 2016-08-22 ENCOUNTER — Encounter: Payer: Self-pay | Admitting: Obstetrics and Gynecology

## 2016-08-22 NOTE — Telephone Encounter (Signed)
Lump size of dime located left of her perinium, her appt is 3 wks away and wants to know if there is something she can do until then. It is painful.

## 2016-09-09 ENCOUNTER — Encounter: Payer: Self-pay | Admitting: Obstetrics and Gynecology

## 2016-09-09 ENCOUNTER — Ambulatory Visit (INDEPENDENT_AMBULATORY_CARE_PROVIDER_SITE_OTHER): Payer: BLUE CROSS/BLUE SHIELD | Admitting: Obstetrics and Gynecology

## 2016-09-09 VITALS — BP 111/73 | HR 120 | Ht 67.0 in | Wt 142.7 lb

## 2016-09-09 DIAGNOSIS — N816 Rectocele: Secondary | ICD-10-CM

## 2016-09-09 DIAGNOSIS — G588 Other specified mononeuropathies: Secondary | ICD-10-CM

## 2016-09-09 DIAGNOSIS — R102 Pelvic and perineal pain: Secondary | ICD-10-CM

## 2016-09-09 DIAGNOSIS — Z90710 Acquired absence of both cervix and uterus: Secondary | ICD-10-CM

## 2016-09-09 NOTE — Patient Instructions (Signed)
1. #2 donut pessary is inserted today. 2. Patient will insert and remove the pessary at home periodically every couple days 3. Use Trimosan gel intravaginal once a week if pessary is left in long-term 4. Return in 4 weeks for follow-up 5. Continue monitoring chronic pelvic pain, dyspareunia, pudendal menorrhagia symptoms. Return for exacerbations.

## 2016-09-09 NOTE — Progress Notes (Signed)
Chief complaint: 1. Symptomatic rectocele 2. Vulvar pain 3. History of pudendal neuralgia 4. Status post abdominal hysterectomy  Patient presents for insertion of #2 donut pessary for management of symptomatic rectocele. Patient's symptoms of vulvar pain, dyspareunia, pelvic pain due to pudendal neuralgia are stable.  OBJECTIVE: BP 111/73   Pulse (!) 120   Ht 5\' 7"  (1.702 m)   Wt 142 lb 11.2 oz (64.7 kg)   BMI 22.35 kg/m  Pleasant female in no acute distress. Alert and oriented. ABDOMEN: Soft, non distended; Non tender.  No Organomegaly. PELVIC:             External Genitalia: Normal             BUS: Normal             Vagina: NormalMucosa; first-degree cystocele with Valsalva; Good apical support; small distal rectocele; milky white secretions present, no malodor             Cervix: Surgically absent             Uterus: Surgically absent             Adnexa: Normal; nonpalpable and nontender             RV: Normal external exam             Bladder: Nontender  PROCEDURE: #2 donut pessary is inserted  ASSESSMENT: 1. Symptomatic rectocele  PLAN: 1. #2 donut pessary is inserted. 2. Instructions for pessary maintenance are given 3. Return in 4 weeks for pessary maintenance  Brayton Mars, MD  Note: This dictation was prepared with Dragon dictation along with smaller phrase technology. Any transcriptional errors that result from this process are unintentional.

## 2016-09-09 NOTE — Progress Notes (Signed)
GYN ENCOUNTER NOTE  Subjective:       Sonia Smith is a 36 y.o. G23P2002 female is here for gynecologic evaluation of the following issues:   1. Symptomatic rectocele: #2 donut pessary insertion  2. S/p abdominal hysterectomy - with h/o endometriosis- 2008  3. Vulvar pain 3. H/o chronic pudendal neuralgia  - s/p vaginal delivery 13 years ago. 4. H/o chronic dysuria  6. H/o vaginitis with dyspareunia - L side introitus pain worse than R.   - Pt. Presents for #2 donut pessary insertion to manage symptomatic, small rectocele & associated sx of chronic constipation and splinting during bowel movements.  - Patient's symptoms of vulvar pain, dyspareunia, pelvic pain due to pudendal neuralgia are stable. - Pt reports seeing Dr. Amalia Hailey [facility]) re: her urinary [place] for pudendal nerve stimulation.     Gynecologic History No LMP recorded. Patient has had a hysterectomy. Contraception: status post hysterectomy Last Pap: 05/16/16. Results were: normal Last mammogram: n/a. Results were:n/a   Obstetric History OB History  Gravida Para Term Preterm AB Living  2 2 2  0 0 2  SAB TAB Ectopic Multiple Live Births  0 0 0 0 2    # Outcome Date GA Lbr Len/2nd Weight Sex Delivery Anes PTL Lv  2 Term 2004 [redacted]w[redacted]d  7 lb (3.175 kg) M Vag-Spont  N LIV  1 Term 2002 [redacted]w[redacted]d  7 lb 8 oz (3.402 kg) F Vag-Spont  Y LIV      Past Medical History:  Diagnosis Date  . Blood in stool   . 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 Depression    in high school  . GERD (gastroesophageal reflux disease)   . Lupus    tested positive many years ago, most recent came back negative.  . Migraines   . Painful intercourse   . Pudendal neuralgia   . Vaginal Pap smear, abnormal     Past Surgical History:  Procedure Laterality Date  . ABDOMINAL HYSTERECTOMY  2008  . 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 CERVICAL CERCLAGE  2001, 2004  . HEMORRHOID SURGERY  N/A 11/06/2015   Procedure: HEMORRHOIDECTOMY;  Surgeon: Leonie Green, MD;  Location: [facility] ORS;  Service: General;  Laterality: N/A;  . LEEP  1998   dysplasia    Current Outpatient Prescriptions on File Prior to Visit  Medication Sig Dispense Refill  . acetaminophen-codeine (TYLENOL #4) 300-60 MG tablet Take by mouth.    . docusate sodium (COLACE) 250 MG capsule Take by mouth.    . estradiol (ESTRACE VAGINAL) 0.1 MG/GM vaginal cream Place 0.5 Applicatorfuls vaginally 2 (two) times a week. 42.5 g 3  . gabapentin (NEURONTIN) 600 MG tablet Take 2 tablets by mouth 3 (three) times daily.    Marland Kitchen ibuprofen (ADVIL,MOTRIN) 200 MG tablet Take 400 mg by mouth every 6 (six) hours as needed for fever, headache or mild pain.    Marland Kitchen imipramine (TOFRANIL) 25 MG tablet Take 25 mg by mouth at bedtime.    . phenazopyridine (PYRIDIUM) 95 MG tablet Take by mouth.    . traMADol (ULTRAM) 50 MG tablet take 1 tablet by mouth every 6 hours if needed    . tretinoin microspheres (RETIN-A MICRO) 0.04 % gel Apply topically.     No current facility-administered medications on file prior to visit.     Allergies  Allergen Reactions  . Replens  Redness, skin peeling  . Adhesive [Tape] Rash    From bandaides. Tape is OK.    Social History   Social History  . Marital status: Married    Spouse name: N/A  . Number of children: N/A  . Years of education: N/A   Occupational History  . Not on file.   Social History Main Topics  . Smoking status: Former Smoker    Quit date: 10/08/1999  . Smokeless tobacco: Never Used     Comment: quit 2001  . Alcohol use No  . Drug use: No  . Sexual activity: Yes    Birth control/ protection: Surgical   Other Topics Concern  . Not on file   Social History Narrative  . No narrative on file    Family History  Problem Relation Age of Onset  . Diabetes Mother   . Hypertension Mother   . Cancer Father     lung  . Diabetes Father   . Hypertension Father   . Heart  disease Maternal Grandmother   . Heart disease Maternal Grandfather   . Cancer Paternal Grandfather   . Stroke Neg Hx     The following portions of the patient's history were reviewed and updated as appropriate: allergies, current medications, past family history, past medical history, past social history, past surgical history and problem list.  Review of Systems Review of Systems - Negative except those listed in HPI Review of Systems - General ROS: negative for - chills, fatigue, fever, hot flashes, malaise or night sweats Hematological and Lymphatic ROS: negative for - bleeding problems or swollen lymph nodes Gastrointestinal ROS: negative for - abdominal pain, blood in stools,(+) constipation with need to splint during bowel movements  Musculoskeletal ROS: negative for - joint pain, muscle pain or muscular weakness Genito-Urinary ROS: negative for - change in menstrual cycle, dysmenorrhea, (+) dyspareunia: (+) urethritis / burning with urination (+) urine odor (+) dysuria, (-) genital discharge, (-) genital ulcers, (+) hematuria,(+) hx of incontinence, (+) pelvic pain / pudendal pain (+) introits pain - sharp / burning pain, worse on left side. "insertion pain." Reports also losing sensation within vaginal canal.   Objective:   BP 111/73   Pulse (!) 120   Ht 5\' 7"  (1.702 m)   Wt 142 lb 11.2 oz (64.7 kg)   BMI 22.35 kg/m  CONSTITUTIONAL: Well-developed, well-nourished female in no acute distress.  HENT:  Normocephalic, atraumatic.  NECK: Normal range of motion, supple, no masses.  Normal thyroid.  SKIN: Skin is warm and dry. No rash noted. Not diaphoretic. No erythema. No pallor. [place]: Alert and oriented to person, place, and time. PSYCHIATRIC: Normal mood and affect. Normal behavior. Normal judgment and thought content. CARDIOVASCULAR: Regular rate and rhythm, no m/r/g RESPIRATORY: LCAB BREASTS: Not Examined ABDOMEN: Soft, non distended; Non tender.  No  Organomegaly. PELVIC:  External Genitalia: Normal  BUS: Normal  Vagina: Normal mucosa; first-degree cystocele with Valsalva; Good apical support; small distal rectocele; milky white secretions present, no malodor   Cervix: Surgically absent  Uterus: Surgically absent  Adnexa: Normal - non-palpable & non-tender on exam  RV: Normal external exam  Bladder: Nontender MUSCULOSKELETAL: Normal range of motion. No tenderness.  No cyanosis, clubbing, or edema.   PROCEDURE: #2 donut pessary is inserted  Assessment:   1. Symptomatic rectocele - pessary insertion (#2 donut pessary) procedure completed this visit.  Plan:   1. #2 donut pessary insertion at today's visit 2. Instructions for pessary maintenance provided: Patient will insert  and remove the pessary at home periodically every couple days. Use Trimosan gel intravaginal once a week if pessary is left in long-term 4. RTC - 4 weeks for f/u  5. Continue monitoring chronic pelvic pain, dyspareunia, pudendal menorrhagia symptoms & return for exacerbations.   Marrianne Mood, PA-S Brayton Mars, MD   I have seen, interviewed, and examined the patient in conjunction with the [facility].A. student and affirm the diagnosis and management plan. Martin A. DeFrancesco, MD, FACOG   Note: This dictation was prepared with Dragon dictation along with smaller phrase technology. Any transcriptional errors that result from this process are unintentional.

## 2016-09-16 ENCOUNTER — Encounter: Payer: Self-pay | Admitting: Obstetrics and Gynecology

## 2016-09-16 DIAGNOSIS — N94819 Vulvodynia, unspecified: Secondary | ICD-10-CM | POA: Diagnosis not present

## 2016-09-16 DIAGNOSIS — R8290 Unspecified abnormal findings in urine: Secondary | ICD-10-CM | POA: Diagnosis not present

## 2016-09-16 DIAGNOSIS — R102 Pelvic and perineal pain: Secondary | ICD-10-CM | POA: Diagnosis not present

## 2016-09-16 DIAGNOSIS — R3989 Other symptoms and signs involving the genitourinary system: Secondary | ICD-10-CM | POA: Diagnosis not present

## 2016-09-17 ENCOUNTER — Other Ambulatory Visit: Payer: Self-pay | Admitting: Obstetrics and Gynecology

## 2016-09-17 MED ORDER — DESOGESTREL-ETHINYL ESTRADIOL 0.15-0.02/0.01 MG (21/5) PO TABS: 1.0000 | ORAL_TABLET | Freq: Every day | ORAL | 11 refills | Status: DC

## 2016-10-07 ENCOUNTER — Encounter: Payer: Self-pay | Admitting: Obstetrics and Gynecology

## 2016-10-07 ENCOUNTER — Encounter: Payer: Self-pay | Admitting: Internal Medicine

## 2016-10-07 ENCOUNTER — Ambulatory Visit (INDEPENDENT_AMBULATORY_CARE_PROVIDER_SITE_OTHER): Payer: BLUE CROSS/BLUE SHIELD | Admitting: Internal Medicine

## 2016-10-07 VITALS — BP 112/78 | HR 95 | Temp 97.7°F | Wt 144.0 lb

## 2016-10-07 DIAGNOSIS — Z113 Encounter for screening for infections with a predominantly sexual mode of transmission: Secondary | ICD-10-CM

## 2016-10-07 NOTE — Progress Notes (Signed)
Subjective:    Patient ID: Sonia Smith, female    DOB: 1981/07/10, 36 y.o.   MRN: [id number]  HPI  Pt presents to the clinic today for STD screening. She is in a monogamous relationship with her partner that is not faithful to her. They do not use protection. She denies vaginal discharge, itching, odor or bleeding. She has no know exposure. She has chronic dysuria and urethral pain for which she is following with urology. She is requesting gonorrhea, chlamydia and HIV today.  Review of Systems      Past Medical History:  Diagnosis Date  . Blood in stool   . 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 Depression    in high school  . GERD (gastroesophageal reflux disease)   . Lupus    tested positive many years ago, most recent came back negative.  . Migraines   . Painful intercourse   . Pudendal neuralgia   . Vaginal Pap smear, abnormal     Current Outpatient Prescriptions  Medication Sig Dispense Refill  . acetaminophen-codeine (TYLENOL #4) 300-60 MG tablet Take by mouth.    . desogestrel-ethinyl estradiol (MIRCETTE) 0.15-0.02/0.01 MG (21/5) tablet Take 1 tablet by mouth daily. 1 Package 11  . docusate sodium (COLACE) 250 MG capsule Take by mouth.    . gabapentin (NEURONTIN) 600 MG tablet Take 2 tablets by mouth 3 (three) times daily.    Marland Kitchen ibuprofen (ADVIL,MOTRIN) 200 MG tablet Take 400 mg by mouth every 6 (six) hours as needed for fever, headache or mild pain.    Marland Kitchen imipramine (TOFRANIL) 25 MG tablet Take 25 mg by mouth at bedtime.    . phenazopyridine (PYRIDIUM) 95 MG tablet Take by mouth.    . traMADol (ULTRAM) 50 MG tablet take 1 tablet by mouth every 6 hours if needed    . tretinoin microspheres (RETIN-A MICRO) 0.04 % gel Apply topically.     No current facility-administered medications for this visit.     Allergies  Allergen Reactions  . Replens     Redness, skin peeling  . Adhesive [Tape] Rash    From bandaides. Tape is OK.    Family History  Problem  Relation Age of Onset  . Diabetes Mother   . Hypertension Mother   . Cancer Father     lung  . Diabetes Father   . Hypertension Father   . Heart disease Maternal Grandmother   . Heart disease Maternal Grandfather   . Cancer Paternal Grandfather   . Stroke Neg Hx     Social History   Social History  . Marital status: Married    Spouse name: N/A  . Number of children: N/A  . Years of education: N/A   Occupational History  . Not on file.   Social History Main Topics  . Smoking status: Former Smoker    Quit date: 10/08/1999  . Smokeless tobacco: Never Used     Comment: quit 2001  . Alcohol use No  . Drug use: No  . Sexual activity: Yes    Birth control/ protection: Surgical   Other Topics Concern  . Not on file   Social History Narrative  . No narrative on file     Constitutional: Denies fever, malaise, fatigue, headache or abrupt weight changes.  Gastrointestinal: Denies abdominal pain, bloating, constipation, diarrhea or blood in the stool.  GU: Denies urgency, frequency, blood in urine, odor or discharge.  No other specific complaints in a  complete review of systems (except as listed in HPI above).  Objective:   Physical Exam  BP 112/78   Pulse 95   Temp 97.7 F (36.5 C) (Oral)   Wt 144 lb (65.3 kg)   SpO2 99%   BMI 22.55 kg/m  Wt Readings from Last 3 Encounters:  10/07/16 144 lb (65.3 kg)  09/09/16 142 lb 11.2 oz (64.7 kg)  07/17/16 135 lb (61.2 kg)    General: Appears her stated age, well developed, well nourished in NAD. Abdomen: Soft and nontender.  Pelvic: Normal female anatomy. No discharge noted.  BMET    Component Value Date/Time   NA 140 01/03/2016 1511   K 3.9 01/03/2016 1511   CL 104 01/03/2016 1511   CO2 32 01/03/2016 1511   GLUCOSE 88 01/03/2016 1511   BUN 7 01/03/2016 1511   CREATININE 0.84 01/03/2016 1511   CALCIUM 9.4 01/03/2016 1511   GFRNONAA >60 08/19/2015 0158   GFRAA >60 08/19/2015 0158    Lipid Panel       Component Value Date/Time   CHOL 210 (H) 01/03/2016 1511   TRIG 58.0 01/03/2016 1511   HDL 44.70 01/03/2016 1511   CHOLHDL 5 01/03/2016 1511   VLDL 11.6 01/03/2016 1511   LDLCALC 154 (H) 01/03/2016 1511    CBC    Component Value Date/Time   WBC 6.1 01/03/2016 1511   RBC 4.45 01/03/2016 1511   HGB 14.2 01/03/2016 1511   HCT 42.4 01/03/2016 1511   PLT 216.0 01/03/2016 1511   MCV 95.3 01/03/2016 1511   MCH 30.6 08/19/2015 0158   MCHC 33.5 01/03/2016 1511   RDW 12.7 01/03/2016 1511    Hgb A1C No results found for: HGBA1C          Assessment & Plan:   Screen for STD:  Cervical gen probe today HIV ordered  RTC as needed or if symptoms persist or worsen Sonia Buchbinder, NP

## 2016-10-07 NOTE — Addendum Note (Signed)
Addended by: Ellamae Sia on: 10/07/2016 02:34 PM   Modules accepted: Orders

## 2016-10-07 NOTE — Patient Instructions (Signed)
Cervicitis Cervicitis is when the cervix gets irritated and swollen. Your cervix is the lower end of your uterus. Follow these instructions at home:  Do not have sex until your doctor says it is okay.  Take over-the-counter and prescription medicines only as told by your doctor.  If you were prescribed an antibiotic medicine, take it as told by your doctor. Do not stop taking it even if you start to feel better.  Keep all follow-up visits as told by your doctor. This is important. Contact a doctor if:  Your symptoms come back after treatment.  Your symptoms get worse after treatment.  You have a fever.  You feel tired (fatigued).  Your belly (abdomen) hurts.  You feel like you are going to throw up (are nauseous).  You throw up (vomit).  You have watery poop (diarrhea).  Your back hurts. Get help right away if:  You have very bad pain in your belly, and medicine does not help it.  You cannot pee (urinate). Summary  Cervicitis is when the cervix gets irritated and swollen.  Do not have sex until your doctor says it is okay.  If you need to take an antibiotic, do not stop taking even if you start to feel better. Take medicines only as told by your doctor. This information is not intended to replace advice given to you by your health care provider. Make sure you discuss any questions you have with your health care provider. Document Released: 04/29/2008 Document Revised: 04/06/2016 Document Reviewed: 04/06/2016 Elsevier Interactive Patient Education  2017 Elsevier Inc.  

## 2016-10-07 NOTE — Addendum Note (Signed)
Addended by: Jearld Fenton on: 10/07/2016 02:23 PM   Modules accepted: Orders

## 2016-10-08 LAB — HIV ANTIBODY (ROUTINE TESTING W REFLEX): HIV 1&2 Ab, 4th Generation: NONREACTIVE

## 2016-10-08 LAB — GC/CHLAMYDIA PROBE AMP
CT Probe RNA: NOT DETECTED
GC Probe RNA: NOT DETECTED

## 2016-10-11 DIAGNOSIS — N390 Urinary tract infection, site not specified: Secondary | ICD-10-CM | POA: Diagnosis not present

## 2016-10-11 DIAGNOSIS — Z299 Encounter for prophylactic measures, unspecified: Secondary | ICD-10-CM | POA: Diagnosis not present

## 2016-10-11 DIAGNOSIS — Z6821 Body mass index (BMI) 21.0-21.9, adult: Secondary | ICD-10-CM | POA: Diagnosis not present

## 2016-10-11 DIAGNOSIS — R300 Dysuria: Secondary | ICD-10-CM | POA: Diagnosis not present

## 2016-10-13 ENCOUNTER — Encounter: Payer: Self-pay | Admitting: Internal Medicine

## 2016-10-13 ENCOUNTER — Ambulatory Visit: Payer: Self-pay | Admitting: Internal Medicine

## 2016-10-16 ENCOUNTER — Ambulatory Visit: Payer: BLUE CROSS/BLUE SHIELD | Admitting: Internal Medicine

## 2016-10-21 ENCOUNTER — Encounter: Payer: Self-pay | Admitting: Internal Medicine

## 2016-10-21 ENCOUNTER — Ambulatory Visit (INDEPENDENT_AMBULATORY_CARE_PROVIDER_SITE_OTHER): Payer: BLUE CROSS/BLUE SHIELD | Admitting: Internal Medicine

## 2016-10-21 VITALS — BP 108/72 | HR 88 | Temp 98.2°F | Wt 147.2 lb

## 2016-10-21 DIAGNOSIS — Z118 Encounter for screening for other infectious and parasitic diseases: Secondary | ICD-10-CM

## 2016-10-21 NOTE — Progress Notes (Signed)
Subjective:    Patient ID: Sonia Smith, female    DOB: 03/03/1981, 36 y.o.   MRN: [id number]  HPI  Pt presents to the clinic today requesting STD screening. She reports a sexual partner of hers recently told her that he tested positive for chlamydia. She was tested after the fact but her chlamydia test came back negative. She feels like this is a false negative and would like to be retested. She denies vaginal discharge, odor or pain. She has chronic dysuria and is following with urology.  Review of Systems      Past Medical History:  Diagnosis Date  . Blood in stool   . 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 Depression    in high school  . GERD (gastroesophageal reflux disease)   . Lupus    tested positive many years ago, most recent came back negative.  . Migraines   . Painful intercourse   . Pudendal neuralgia   . Vaginal Pap smear, abnormal     Current Outpatient Prescriptions  Medication Sig Dispense Refill  . acetaminophen-codeine (TYLENOL #4) 300-60 MG tablet Take by mouth.    . cyclobenzaprine (FLEXERIL) 10 MG tablet     . desogestrel-ethinyl estradiol (MIRCETTE) 0.15-0.02/0.01 MG (21/5) tablet Take 1 tablet by mouth daily. 1 Package 11  . docusate sodium (COLACE) 250 MG capsule Take by mouth.    . gabapentin (NEURONTIN) 600 MG tablet Take 2 tablets by mouth 3 (three) times daily.    Marland Kitchen ibuprofen (ADVIL,MOTRIN) 200 MG tablet Take 400 mg by mouth every 6 (six) hours as needed for fever, headache or mild pain.    Marland Kitchen imipramine (TOFRANIL) 25 MG tablet Take 25 mg by mouth at bedtime.    . phenazopyridine (PYRIDIUM) 95 MG tablet Take by mouth.    . traMADol (ULTRAM) 50 MG tablet take 1 tablet by mouth every 6 hours if needed    . tretinoin microspheres (RETIN-A MICRO) 0.04 % gel Apply topically.     No current facility-administered medications for this visit.     Allergies  Allergen Reactions  . Replens     Redness, skin peeling  . Adhesive [Tape] Rash   From bandaides. Tape is OK.    Family History  Problem Relation Age of Onset  . Diabetes Mother   . Hypertension Mother   . Cancer Father     lung  . Diabetes Father   . Hypertension Father   . Heart disease Maternal Grandmother   . Heart disease Maternal Grandfather   . Cancer Paternal Grandfather   . Stroke Neg Hx     Social History   Social History  . Marital status: Married    Spouse name: N/A  . Number of children: N/A  . Years of education: N/A   Occupational History  . Not on file.   Social History Main Topics  . Smoking status: Former Smoker    Quit date: 10/08/1999  . Smokeless tobacco: Never Used     Comment: quit 2001  . Alcohol use No  . Drug use: No  . Sexual activity: Yes    Birth control/ protection: Surgical   Other Topics Concern  . Not on file   Social History Narrative  . No narrative on file     Constitutional: Denies fever, malaise, fatigue, headache or abrupt weight changes.  GU: Pt reports dysuria. Denies urgency, frequency, burning sensation, blood in urine, odor or discharge.   No  other specific complaints in a complete review of systems (except as listed in HPI above).  Objective:   Physical Exam  BP 108/72   Pulse 88   Temp 98.2 F (36.8 C) (Oral)   Wt 147 lb 4 oz (66.8 kg)   SpO2 99%   BMI 23.06 kg/m  Wt Readings from Last 3 Encounters:  10/21/16 147 lb 4 oz (66.8 kg)  10/07/16 144 lb (65.3 kg)  09/09/16 142 lb 11.2 oz (64.7 kg)    General: Appears her stated age, well developed, well nourished in NAD. Abdomen: Soft and nontender. Normal bowel sounds. No distention or masses noted.   BMET    Component Value Date/Time   NA 140 01/03/2016 1511   K 3.9 01/03/2016 1511   CL 104 01/03/2016 1511   CO2 32 01/03/2016 1511   GLUCOSE 88 01/03/2016 1511   BUN 7 01/03/2016 1511   CREATININE 0.84 01/03/2016 1511   CALCIUM 9.4 01/03/2016 1511   GFRNONAA >60 08/19/2015 0158   GFRAA >60 08/19/2015 0158    Lipid Panel      Component Value Date/Time   CHOL 210 (H) 01/03/2016 1511   TRIG 58.0 01/03/2016 1511   HDL 44.70 01/03/2016 1511   CHOLHDL 5 01/03/2016 1511   VLDL 11.6 01/03/2016 1511   LDLCALC 154 (H) 01/03/2016 1511    CBC    Component Value Date/Time   WBC 6.1 01/03/2016 1511   RBC 4.45 01/03/2016 1511   HGB 14.2 01/03/2016 1511   HCT 42.4 01/03/2016 1511   PLT 216.0 01/03/2016 1511   MCV 95.3 01/03/2016 1511   MCH 30.6 08/19/2015 0158   MCHC 33.5 01/03/2016 1511   RDW 12.7 01/03/2016 1511    Hgb A1C No results found for: HGBA1C          Assessment & Plan:   Screen for Chlamydia:  Urine G/C probe amp Will treat if positive Discussed importance in use of condoms to prevent spread of STD's  RTC as needed  Webb Silversmith, NP

## 2016-10-21 NOTE — Addendum Note (Signed)
Addended by: Lurlean Nanny on: 10/21/2016 04:07 PM   Modules accepted: Orders

## 2016-10-21 NOTE — Patient Instructions (Signed)
Chlamydia, Female Chlamydia is an STD (sexually transmitted disease). This is an infection that spreads through sexual contact. If it is not treated, it can cause serious problems. It must be treated with antibiotic medicine. Sometimes, you may not have symptoms (asymptomatic). When you have symptoms, they can include:  Burning when you pee (urinate).  Peeing often.  Fluid (discharge) coming from the vagina.  Redness, soreness, and swelling (inflammation) of the butt (rectum).  Bleeding or fluid coming from the butt.  Belly (abdominal) pain.  Pain during sex.  Bleeding between periods.  Itching, burning, or redness in the eyes.  Fluid coming from the eyes.  Follow these instructions at home: Medicines  Take over-the-counter and prescription medicines only as told by your doctor.  Take your antibiotic medicine as told by your doctor. Do not stop taking the antibiotic even if you start to feel better. Sexual activity  Tell sex partners about your infection. Sex partners are people you had oral, anal, or vaginal sex with within 60 days of when you started getting sick. They need treatment, too.  Do not have sex until: ? You and your sex partners have been treated. ? Your doctor says it is okay.  If you have a single dose treatment, wait 7 days before having sex. General instructions  It is up to you to get your test results. Ask your doctor when your results will be ready.  Get a lot of rest.  Eat healthy foods.  Drink enough fluid to keep your pee (urine) clear or pale yellow.  Keep all follow-up visits as told by your doctor. You may need tests after 3 months. Preventing chlamydia  The only way to prevent chlamydia is not to have sex. To lower your risk: ? Use latex condoms correctly. Do this every time you have sex. ? Avoid having many sex partners. ? Ask if your partner has been tested for STDs and if he or she had negative results. Contact a doctor if:  You  get new symptoms.  You do not get better with treatment.  You have a fever or chills.  You have pain during sex. Get help right away if:  Your pain gets worse and does not get better with medicine.  You get flu-like symptoms, such as: ? Night sweats. ? Sore throat. ? Muscle aches.  You feel sick to your stomach (nauseous).  You throw up (vomit).  You have trouble swallowing.  You have bleeding: ? Between periods. ? After sex.  You have irregular periods.  You have belly pain that does not get better with medicine.  You have lower back pain that does not get better with medicine.  You feel weak or dizzy.  You pass out (faint).  You are pregnant and you get symptoms of chlamydia. Summary  Chlamydia is an infection that spreads through sexual contact.  Sometimes, chlamydia can cause no symptoms (asymptomatic).  Do not have sex until your doctor says it is okay.  All sex partners will have to be treated for chlamydia. This information is not intended to replace advice given to you by your health care provider. Make sure you discuss any questions you have with your health care provider. Document Released: 04/29/2008 Document Revised: 07/10/2016 Document Reviewed: 07/10/2016 Elsevier Interactive Patient Education  2017 Elsevier Inc.  

## 2016-10-22 LAB — GC/CHLAMYDIA PROBE AMP
CT Probe RNA: NOT DETECTED
GC Probe RNA: NOT DETECTED

## 2016-10-31 DIAGNOSIS — R300 Dysuria: Secondary | ICD-10-CM | POA: Diagnosis not present

## 2016-10-31 DIAGNOSIS — N3000 Acute cystitis without hematuria: Secondary | ICD-10-CM | POA: Diagnosis not present

## 2016-12-05 ENCOUNTER — Ambulatory Visit (INDEPENDENT_AMBULATORY_CARE_PROVIDER_SITE_OTHER): Payer: BLUE CROSS/BLUE SHIELD | Admitting: Obstetrics and Gynecology

## 2016-12-05 ENCOUNTER — Encounter: Payer: Self-pay | Admitting: Obstetrics and Gynecology

## 2016-12-05 VITALS — BP 106/80 | HR 99 | Ht 67.0 in | Wt 147.6 lb

## 2016-12-05 DIAGNOSIS — Z113 Encounter for screening for infections with a predominantly sexual mode of transmission: Secondary | ICD-10-CM | POA: Diagnosis not present

## 2016-12-05 NOTE — Progress Notes (Signed)
Subjective:     Patient ID: Sonia Smith, female   DOB: [birth year], 36 y.o.   MRN: [id number]  HPI Here for STI check, condom broke 2 weeks ago, and wanted to be checked to be sure. Denies any s/s of infection.  Review of Systems Negative     Objective:   Physical Exam A&O x4 Well groomed female in no distress Blood pressure 106/80, pulse 99, height 5\' 7"  (1.702 m), weight 147 lb 9.6 oz (67 kg). Urine obtained for screening- sent off    Assessment:     STI screening    Plan:     RTC as needed.  Virga Haltiwanger [place], CNM

## 2016-12-07 LAB — GC/CHLAMYDIA PROBE AMP
Chlamydia trachomatis, NAA: NEGATIVE
Neisseria gonorrhoeae by PCR: NEGATIVE

## 2016-12-09 DIAGNOSIS — R319 Hematuria, unspecified: Secondary | ICD-10-CM | POA: Diagnosis not present

## 2016-12-09 DIAGNOSIS — R399 Unspecified symptoms and signs involving the genitourinary system: Secondary | ICD-10-CM | POA: Diagnosis not present

## 2016-12-09 DIAGNOSIS — N390 Urinary tract infection, site not specified: Secondary | ICD-10-CM | POA: Diagnosis not present

## 2016-12-10 ENCOUNTER — Ambulatory Visit: Payer: BLUE CROSS/BLUE SHIELD | Admitting: Primary Care

## 2016-12-16 DIAGNOSIS — N3941 Urge incontinence: Secondary | ICD-10-CM | POA: Diagnosis not present

## 2016-12-16 DIAGNOSIS — N3010 Interstitial cystitis (chronic) without hematuria: Secondary | ICD-10-CM | POA: Diagnosis not present

## 2016-12-31 ENCOUNTER — Ambulatory Visit: Payer: Self-pay | Admitting: Internal Medicine

## 2017-01-01 ENCOUNTER — Ambulatory Visit (INDEPENDENT_AMBULATORY_CARE_PROVIDER_SITE_OTHER): Payer: BLUE CROSS/BLUE SHIELD | Admitting: Internal Medicine

## 2017-01-01 ENCOUNTER — Encounter: Payer: Self-pay | Admitting: Internal Medicine

## 2017-01-01 VITALS — BP 106/74 | HR 94 | Temp 98.1°F | Wt 147.0 lb

## 2017-01-01 DIAGNOSIS — N898 Other specified noninflammatory disorders of vagina: Secondary | ICD-10-CM

## 2017-01-01 LAB — POC URINALSYSI DIPSTICK (AUTOMATED)
Bilirubin, UA: NEGATIVE
Blood, UA: NEGATIVE
Glucose, UA: NEGATIVE
Ketones, UA: NEGATIVE
Leukocytes, UA: NEGATIVE
Nitrite, UA: NEGATIVE
Protein, UA: NEGATIVE
Spec Grav, UA: 1.025 (ref 1.010–1.025)
Urobilinogen, UA: 0.2 E.U./dL
pH, UA: 6 (ref 5.0–8.0)

## 2017-01-01 NOTE — Patient Instructions (Signed)

## 2017-01-01 NOTE — Addendum Note (Signed)
Addended by: Ellamae Sia on: 01/01/2017 09:39 AM   Modules accepted: Orders

## 2017-01-01 NOTE — Progress Notes (Signed)
Subjective:    Patient ID: Sonia Smith, female    DOB: Aug 22, 1980, 36 y.o.   MRN: [id number]  HPI  Pt presents to the clinic today with c/o vaginal discharge and peeling of the skin of the vaginal area. She noticed this 1 week ago. The discharge is white. She denies odor or abnormal bleeding. She reports burning of the vaginal area with urination but denies urgency, frequency or dysuria. She has not tried anything OTC for her symptoms. She saw GYN 3 weeks ago and had a negative STD workup.  Review of Systems      Past Medical History:  Diagnosis Date  . Blood in stool   . 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 Depression    in high school  . GERD (gastroesophageal reflux disease)   . Lupus    tested positive many years ago, most recent came back negative.  . Migraines   . Painful intercourse   . Pudendal neuralgia   . Vaginal Pap smear, abnormal     Current Outpatient Prescriptions  Medication Sig Dispense Refill  . acetaminophen-codeine (TYLENOL #4) 300-60 MG tablet Take by mouth.    . cyclobenzaprine (FLEXERIL) 10 MG tablet     . desogestrel-ethinyl estradiol (MIRCETTE) 0.15-0.02/0.01 MG (21/5) tablet Take 1 tablet by mouth daily. 1 Package 11  . docusate sodium (COLACE) 250 MG capsule Take by mouth.    . gabapentin (NEURONTIN) 600 MG tablet Take 2 tablets by mouth 3 (three) times daily.    Marland Kitchen ibuprofen (ADVIL,MOTRIN) 200 MG tablet Take 400 mg by mouth every 6 (six) hours as needed for fever, headache or mild pain.    Marland Kitchen imipramine (TOFRANIL) 25 MG tablet Take 25 mg by mouth at bedtime.    . phenazopyridine (PYRIDIUM) 95 MG tablet Take by mouth.    . traMADol (ULTRAM) 50 MG tablet take 1 tablet by mouth every 6 hours if needed    . tretinoin microspheres (RETIN-A MICRO) 0.04 % gel Apply topically.     No current facility-administered medications for this visit.     Allergies  Allergen Reactions  . Replens     Redness, skin peeling  . Adhesive [Tape] Rash   From bandaides. Tape is OK.    Family History  Problem Relation Age of Onset  . Diabetes Mother   . Hypertension Mother   . Cancer Father        lung  . Diabetes Father   . Hypertension Father   . Heart disease Maternal Grandmother   . Heart disease Maternal Grandfather   . Cancer Paternal Grandfather   . Stroke Neg Hx     Social History   Social History  . Marital status: Married    Spouse name: N/A  . Number of children: N/A  . Years of education: N/A   Occupational History  . Not on file.   Social History Main Topics  . Smoking status: Former Smoker    Quit date: 10/08/1999  . Smokeless tobacco: Never Used     Comment: quit 2001  . Alcohol use No  . Drug use: No  . Sexual activity: Yes    Birth control/ protection: Surgical   Other Topics Concern  . Not on file   Social History Narrative  . No narrative on file     Constitutional: Denies fever, malaise, fatigue, headache or abrupt weight changes.  GU: Pt reports vaginal discharge and irritation. Denies urgency, frequency, pain with  urination, burning sensation, blood in urine, odor.   No other specific complaints in a complete review of systems (except as listed in HPI above).  Objective:   Physical Exam  BP 106/74   Pulse 94   Temp 98.1 F (36.7 C) (Oral)   Wt 147 lb (66.7 kg)   SpO2 99%   BMI 23.02 kg/m  Wt Readings from Last 3 Encounters:  01/01/17 147 lb (66.7 kg)  12/05/16 147 lb 9.6 oz (67 kg)  10/21/16 147 lb 4 oz (66.8 kg)    Abdomen: Soft and nontender. Normal bowel sounds. No distention or masses noted. Pelvic: Normal female anatomy. No external irritation noted. No vaginal discharge or odor present.   BMET    Component Value Date/Time   NA 140 01/03/2016 1511   K 3.9 01/03/2016 1511   CL 104 01/03/2016 1511   CO2 32 01/03/2016 1511   GLUCOSE 88 01/03/2016 1511   BUN 7 01/03/2016 1511   CREATININE 0.84 01/03/2016 1511   CALCIUM 9.4 01/03/2016 1511   GFRNONAA >60 08/19/2015  0158   GFRAA >60 08/19/2015 0158    Lipid Panel     Component Value Date/Time   CHOL 210 (H) 01/03/2016 1511   TRIG 58.0 01/03/2016 1511   HDL 44.70 01/03/2016 1511   CHOLHDL 5 01/03/2016 1511   VLDL 11.6 01/03/2016 1511   LDLCALC 154 (H) 01/03/2016 1511    CBC    Component Value Date/Time   WBC 6.1 01/03/2016 1511   RBC 4.45 01/03/2016 1511   HGB 14.2 01/03/2016 1511   HCT 42.4 01/03/2016 1511   PLT 216.0 01/03/2016 1511   MCV 95.3 01/03/2016 1511   MCH 30.6 08/19/2015 0158   MCHC 33.5 01/03/2016 1511   RDW 12.7 01/03/2016 1511    Hgb A1C No results found for: HGBA1C        Assessment & Plan:   Vaginal Discharge and Irritation:  Urinalysis: Normal Wet Prep: negative Will check urine for gonorrhea, chlamydia and trichomonas Avoid douching, bubble baths, summers eve products and scented lubricants  Will follow up after test results are back, RTC as needed Webb Silversmith, NP

## 2017-01-01 NOTE — Addendum Note (Signed)
Addended by: Lurlean Nanny on: 01/01/2017 02:33 PM   Modules accepted: Orders

## 2017-01-02 LAB — GC/CHLAMYDIA PROBE AMP
CT Probe RNA: NOT DETECTED
GC Probe RNA: NOT DETECTED

## 2017-01-02 LAB — TRICHOMONAS VAGINALIS RNA, QL,MALES: Trichomonas vaginalis RNA: NOT DETECTED

## 2017-01-07 ENCOUNTER — Encounter: Payer: Self-pay | Admitting: Obstetrics and Gynecology

## 2017-01-07 ENCOUNTER — Other Ambulatory Visit: Payer: Self-pay | Admitting: Obstetrics and Gynecology

## 2017-01-22 DIAGNOSIS — N390 Urinary tract infection, site not specified: Secondary | ICD-10-CM | POA: Diagnosis not present

## 2017-01-22 DIAGNOSIS — R319 Hematuria, unspecified: Secondary | ICD-10-CM | POA: Diagnosis not present

## 2017-01-22 DIAGNOSIS — R350 Frequency of micturition: Secondary | ICD-10-CM | POA: Diagnosis not present

## 2017-01-26 ENCOUNTER — Ambulatory Visit: Payer: Self-pay | Admitting: Internal Medicine

## 2017-01-27 ENCOUNTER — Ambulatory Visit: Payer: BLUE CROSS/BLUE SHIELD | Admitting: Internal Medicine

## 2017-02-24 ENCOUNTER — Encounter: Payer: Self-pay | Admitting: Internal Medicine

## 2017-02-24 ENCOUNTER — Ambulatory Visit (INDEPENDENT_AMBULATORY_CARE_PROVIDER_SITE_OTHER): Payer: BLUE CROSS/BLUE SHIELD | Admitting: Internal Medicine

## 2017-02-24 VITALS — BP 110/68 | HR 85 | Temp 98.1°F | Wt 140.5 lb

## 2017-02-24 DIAGNOSIS — R300 Dysuria: Secondary | ICD-10-CM | POA: Diagnosis not present

## 2017-02-24 DIAGNOSIS — Z113 Encounter for screening for infections with a predominantly sexual mode of transmission: Secondary | ICD-10-CM

## 2017-02-24 LAB — POC URINALSYSI DIPSTICK (AUTOMATED)
Bilirubin, UA: NEGATIVE
Blood, UA: NEGATIVE
Glucose, UA: NEGATIVE
Ketones, UA: NEGATIVE
Leukocytes, UA: NEGATIVE
Nitrite, UA: NEGATIVE
Spec Grav, UA: >=1.03 — AB (ref 1.010–1.025)
Urobilinogen, UA: NEGATIVE E.U./dL — AB
pH, UA: 6 (ref 5.0–8.0)

## 2017-02-24 NOTE — Patient Instructions (Signed)
Sexually Transmitted Disease  A sexually transmitted disease (STD) is a disease or infection that may be passed (transmitted) from person to person, usually during sexual activity. This may happen by way of saliva, semen, blood, vaginal mucus, or urine. Common STDs include:   Gonorrhea.   Chlamydia.   Syphilis.   HIV and AIDS.   Genital herpes.   Hepatitis B and C.   Trichomonas.   Human papillomavirus (HPV).   Pubic lice.   Scabies.   Mites.   Bacterial vaginosis.    What are the causes?  An STD may be caused by bacteria, a virus, or parasites. STDs are often transmitted during sexual activity if one person is infected. However, they may also be transmitted through nonsexual means. STDs may be transmitted after:   Sexual intercourse with an infected person.   Sharing sex toys with an infected person.   Sharing needles with an infected person or using unclean piercing or tattoo needles.   Having intimate contact with the genitals, mouth, or rectal areas of an infected person.   Exposure to infected fluids during birth.    What are the signs or symptoms?  Different STDs have different symptoms. Some people may not have any symptoms. If symptoms are present, they may include:   Painful or bloody urination.   Pain in the pelvis, abdomen, vagina, anus, throat, or eyes.   A skin rash, itching, or irritation.   Growths, ulcerations, blisters, or sores in the genital and anal areas.   Abnormal vaginal discharge with or without bad odor.   Penile discharge in men.   Fever.   Pain or bleeding during sexual intercourse.   Swollen glands in the groin area.   Yellow skin and eyes (jaundice). This is seen with hepatitis.   Swollen testicles.   Infertility.   Sores and blisters in the mouth.    How is this diagnosed?  To make a diagnosis, your health care provider may:   Take a medical history.   Perform a physical exam.   Take a sample of any discharge to examine.   Swab the throat, cervix,  opening to the penis, rectum, or vagina for testing.   Test a sample of your first morning urine.   Perform blood tests.   Perform a Pap test, if this applies.   Perform a colposcopy.   Perform a laparoscopy.    How is this treated?  Treatment depends on the STD. Some STDs may be treated but not cured.   Chlamydia, gonorrhea, trichomonas, and syphilis can be cured with antibiotic medicine.   Genital herpes, hepatitis, and HIV can be treated, but not cured, with prescribed medicines. The medicines lessen symptoms.   Genital warts from HPV can be treated with medicine or by freezing, burning (electrocautery), or surgery. Warts may come back.   HPV cannot be cured with medicine or surgery. However, abnormal areas may be removed from the cervix, vagina, or vulva.   If your diagnosis is confirmed, your recent sexual partners need treatment. This is true even if they are symptom-free or have a negative culture or evaluation. They should not have sex until their health care providers say it is okay.   Your health care provider may test you for infection again 3 months after treatment.    How is this prevented?  Take these steps to reduce your risk of getting an STD:   Use latex condoms, dental dams, and water-soluble lubricants during sexual activity. Do not use   petroleum jelly or oils.   Avoid having multiple sex partners.   Do not have sex with someone who has other sex partners.   Do not have sex with anyone you do not know or who is at high risk for an STD.   Avoid risky sex practices that can break your skin.   Do not have sex if you have open sores on your mouth or skin.   Avoid drinking too much alcohol or taking illegal drugs. Alcohol and drugs can affect your judgment and put you in a vulnerable position.   Avoid engaging in oral and anal sex acts.   Get vaccinated for HPV and hepatitis. If you have not received these vaccines in the past, talk to your health care provider about whether one or  both might be right for you.   If you are at risk of being infected with HIV, it is recommended that you take a prescription medicine daily to prevent HIV infection. This is called pre-exposure prophylaxis (PrEP). You are considered at risk if:  ? You are a man who has sex with other men (MSM).  ? You are a heterosexual man or woman and are sexually active with more than one partner.  ? You take drugs by injection.  ? You are sexually active with a partner who has HIV.   Talk with your health care provider about whether you are at high risk of being infected with HIV. If you choose to begin PrEP, you should first be tested for HIV. You should then be tested every 3 months for as long as you are taking PrEP.    Contact a health care provider if:   See your health care provider.   Tell your sexual partner(s). They should be tested and treated for any STDs.   Do not have sex until your health care provider says it is okay.  Get help right away if:  Contact your health care provider right away if:   You have severe abdominal pain.   You are a man and notice swelling or pain in your testicles.   You are a woman and notice swelling or pain in your vagina.    This information is not intended to replace advice given to you by your health care provider. Make sure you discuss any questions you have with your health care provider.  Document Released: 10/11/2002 Document Revised: 02/08/2016 Document Reviewed: 02/08/2013  Elsevier Interactive Patient Education  2018 [employer]

## 2017-02-24 NOTE — Progress Notes (Signed)
Subjective:    Patient ID: Sonia Smith, female    DOB: Jan 22, 1981, 36 y.o.   MRN: [id number]  HPI  Pt presents to the clinic today requesting STD screening. She denies known exposure but she is sexually active. She does not always use condoms with sexual intercourse. She does have some mild dysuria. She was recently treated for a UTI with Augmentin. Culture grew out Beta Hemolytic Strep. After she was treated for the UTI, she started noticed some vaginal discharge. The discharge was thick and white. She was having some vaginal itching. She treated this with Monistat OTC and symptoms have since resolved.   Review of Systems      Past Medical History:  Diagnosis Date  . Blood in stool   . 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 Depression    in high school  . GERD (gastroesophageal reflux disease)   . Lupus    tested positive many years ago, most recent came back negative.  . Migraines   . Painful intercourse   . Pudendal neuralgia   . Vaginal Pap smear, abnormal     Current Outpatient Prescriptions  Medication Sig Dispense Refill  . acetaminophen-codeine (TYLENOL #4) 300-60 MG tablet Take by mouth.    Marland Kitchen acyclovir (ZOVIRAX) 800 MG tablet take 1 tablet by mouth every morning 90 tablet 4  . cyclobenzaprine (FLEXERIL) 10 MG tablet     . desogestrel-ethinyl estradiol (MIRCETTE) 0.15-0.02/0.01 MG (21/5) tablet Take 1 tablet by mouth daily. 1 Package 11  . docusate sodium (COLACE) 250 MG capsule Take by mouth.    . gabapentin (NEURONTIN) 600 MG tablet Take 2 tablets by mouth 2 (two) times daily. Take 2 tabs AM, 2 tabs at noon and 1.5 tab QHS    . ibuprofen (ADVIL,MOTRIN) 200 MG tablet Take 400 mg by mouth every 6 (six) hours as needed for fever, headache or mild pain.    . phenazopyridine (PYRIDIUM) 95 MG tablet Take by mouth.    . tretinoin microspheres (RETIN-A MICRO) 0.04 % gel Apply topically.     No current facility-administered medications for this visit.      Allergies  Allergen Reactions  . Replens     Redness, skin peeling  . Adhesive [Tape] Rash    From bandaides. Tape is OK.    Family History  Problem Relation Age of Onset  . Diabetes Mother   . Hypertension Mother   . Cancer Father        lung  . Diabetes Father   . Hypertension Father   . Heart disease Maternal Grandmother   . Heart disease Maternal Grandfather   . Cancer Paternal Grandfather   . Stroke Neg Hx     Social History   Social History  . Marital status: Married    Spouse name: N/A  . Number of children: N/A  . Years of education: N/A   Occupational History  . Not on file.   Social History Main Topics  . Smoking status: Former Smoker    Quit date: 10/08/1999  . Smokeless tobacco: Never Used     Comment: quit 2001  . Alcohol use No  . Drug use: No  . Sexual activity: Yes    Birth control/ protection: Surgical   Other Topics Concern  . Not on file   Social History Narrative  . No narrative on file     Constitutional: Denies fever, malaise, fatigue, headache or abrupt weight changes.  GU: Pt  reports mild dysuria. Denies urgency, frequency, burning sensation, blood in urine, odor or discharge.  No other specific complaints in a complete review of systems (except as listed in HPI above).  Objective:   Physical Exam  BP 110/68   Pulse 85   Temp 98.1 F (36.7 C) (Oral)   Wt 140 lb 8 oz (63.7 kg)   SpO2 98%   BMI 22.01 kg/m  Wt Readings from Last 3 Encounters:  02/24/17 140 lb 8 oz (63.7 kg)  01/01/17 147 lb (66.7 kg)  12/05/16 147 lb 9.6 oz (67 kg)    General: Appears her stated age, well developed, well nourished in NAD. Abdomen: Soft and nontender. Normal bowel sounds. No distention or masses noted.  Pelvic: Self swabbed.  BMET    Component Value Date/Time   NA 140 01/03/2016 1511   K 3.9 01/03/2016 1511   CL 104 01/03/2016 1511   CO2 32 01/03/2016 1511   GLUCOSE 88 01/03/2016 1511   BUN 7 01/03/2016 1511   CREATININE  0.84 01/03/2016 1511   CALCIUM 9.4 01/03/2016 1511   GFRNONAA >60 08/19/2015 0158   GFRAA >60 08/19/2015 0158    Lipid Panel     Component Value Date/Time   CHOL 210 (H) 01/03/2016 1511   TRIG 58.0 01/03/2016 1511   HDL 44.70 01/03/2016 1511   CHOLHDL 5 01/03/2016 1511   VLDL 11.6 01/03/2016 1511   LDLCALC 154 (H) 01/03/2016 1511    CBC    Component Value Date/Time   WBC 6.1 01/03/2016 1511   RBC 4.45 01/03/2016 1511   HGB 14.2 01/03/2016 1511   HCT 42.4 01/03/2016 1511   PLT 216.0 01/03/2016 1511   MCV 95.3 01/03/2016 1511   MCH 30.6 08/19/2015 0158   MCHC 33.5 01/03/2016 1511   RDW 12.7 01/03/2016 1511    Hgb A1C No results found for: HGBA1C       Assessment & Plan:   Dysuria, Screening for STD:  Will send off wet prep Will obtain urine g/c probe Urinalysis: trace protein Will check HIV and RPR  RTC as needed, return precautions discussed Webb Silversmith, NP

## 2017-02-24 NOTE — Addendum Note (Signed)
Addended by: Lurlean Nanny on: 02/24/2017 02:53 PM   Modules accepted: Orders

## 2017-02-25 LAB — GC/CHLAMYDIA PROBE AMP
CT Probe RNA: NOT DETECTED
GC Probe RNA: NOT DETECTED

## 2017-02-25 LAB — WET PREP BY MOLECULAR PROBE
Candida species: NOT DETECTED
Gardnerella vaginalis: NOT DETECTED
Trichomonas vaginosis: NOT DETECTED

## 2017-02-25 LAB — HIV ANTIBODY (ROUTINE TESTING W REFLEX): HIV 1&2 Ab, 4th Generation: NONREACTIVE

## 2017-02-25 LAB — RPR

## 2017-04-15 ENCOUNTER — Encounter: Payer: Self-pay | Admitting: Internal Medicine

## 2017-04-15 ENCOUNTER — Ambulatory Visit (INDEPENDENT_AMBULATORY_CARE_PROVIDER_SITE_OTHER): Payer: BLUE CROSS/BLUE SHIELD | Admitting: Internal Medicine

## 2017-04-15 VITALS — BP 108/68 | HR 100 | Temp 98.2°F | Wt 144.0 lb

## 2017-04-15 DIAGNOSIS — N9489 Other specified conditions associated with female genital organs and menstrual cycle: Secondary | ICD-10-CM

## 2017-04-15 DIAGNOSIS — R300 Dysuria: Secondary | ICD-10-CM | POA: Diagnosis not present

## 2017-04-15 DIAGNOSIS — N898 Other specified noninflammatory disorders of vagina: Secondary | ICD-10-CM | POA: Diagnosis not present

## 2017-04-15 DIAGNOSIS — J029 Acute pharyngitis, unspecified: Secondary | ICD-10-CM | POA: Diagnosis not present

## 2017-04-15 LAB — POC URINALSYSI DIPSTICK (AUTOMATED)
Bilirubin, UA: NEGATIVE
Blood, UA: NEGATIVE
Glucose, UA: NEGATIVE
Ketones, UA: NEGATIVE
Leukocytes, UA: NEGATIVE
Nitrite, UA: NEGATIVE
Protein, UA: NEGATIVE
Spec Grav, UA: 1.015 (ref 1.010–1.025)
Urobilinogen, UA: 0.2 E.U./dL
pH, UA: 7 (ref 5.0–8.0)

## 2017-04-15 LAB — POCT RAPID STREP A (OFFICE): Rapid Strep A Screen: NEGATIVE

## 2017-04-15 NOTE — Patient Instructions (Signed)

## 2017-04-15 NOTE — Progress Notes (Signed)
Subjective:    Patient ID: Sonia Smith, female    DOB: 1981-06-02, 36 y.o.   MRN: [id number]  HPI  Pt presents to the clinic today with c/o dysuria and vaginal discharge. She noticed this 2 weeks ago. She reports she has a burning sensation during urination and after urination. She denies increase in frequency or urgency. She denies blood in her urine. She reports the vaginal discharge is thin and white. She denies foul odor. She denies pelvic pain or abnormal bleeding. She has not tried anything OTC for her symptoms. She has a history of chronic urethral pain and dysuria. She has had a hysterectomy in the past. She is sexually active.  She also c/o sore throat. She reports this started 2 days ago. She is having some difficulty swallowing. She denies runny nose, nasal congestion, ear pain or cough. She has not taken anything OTC for her symptoms.  Review of Systems  Past Medical History:  Diagnosis Date  . Blood in stool   . 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 Depression    in high school  . GERD (gastroesophageal reflux disease)   . Lupus    tested positive many years ago, most recent came back negative.  . Migraines   . Painful intercourse   . Pudendal neuralgia   . Vaginal Pap smear, abnormal     Current Outpatient Prescriptions  Medication Sig Dispense Refill  . acetaminophen-codeine (TYLENOL #4) 300-60 MG tablet Take by mouth.    Marland Kitchen acyclovir (ZOVIRAX) 800 MG tablet take 1 tablet by mouth every morning 90 tablet 4  . cyclobenzaprine (FLEXERIL) 10 MG tablet     . docusate sodium (COLACE) 250 MG capsule Take by mouth.    . gabapentin (NEURONTIN) 600 MG tablet Take 2 tablets by mouth 2 (two) times daily. Take 2 tabs AM, 2 tabs at noon and 1.5 tab QHS    . ibuprofen (ADVIL,MOTRIN) 200 MG tablet Take 400 mg by mouth every 6 (six) hours as needed for fever, headache or mild pain.    . phenazopyridine (PYRIDIUM) 95 MG tablet Take by mouth.    . tretinoin microspheres  (RETIN-A MICRO) 0.04 % gel Apply topically.     No current facility-administered medications for this visit.     Allergies  Allergen Reactions  . Polycarbophil Other (See Comments)    Redness, skin peeling  . Adhesive [Tape] Rash    From bandaides. Tape is OK.    Family History  Problem Relation Age of Onset  . Diabetes Mother   . Hypertension Mother   . Cancer Father        lung  . Diabetes Father   . Hypertension Father   . Heart disease Maternal Grandmother   . Heart disease Maternal Grandfather   . Cancer Paternal Grandfather   . Stroke Neg Hx     Social History   Social History  . Marital status: Married    Spouse name: N/A  . Number of children: N/A  . Years of education: N/A   Occupational History  . Not on file.   Social History Main Topics  . Smoking status: Former Smoker    Quit date: 10/08/1999  . Smokeless tobacco: Never Used     Comment: quit 2001  . Alcohol use No  . Drug use: No  . Sexual activity: Yes    Birth control/ protection: Surgical   Other Topics Concern  . Not on file  Social History Narrative  . No narrative on file     Constitutional: Denies fever, malaise, fatigue, headache or abrupt weight changes.  ENT: Pt reports sore throat. Denies ear pain, runny nose or nasal congestion. Gastrointestinal: Denies abdominal pain, bloating, constipation, diarrhea or blood in the stool.  GU: Pt reports dysuria, burning sensation and vaginal discharge. Denies urgency, frequency, blood in urine, odor.  No other specific complaints in a complete review of systems (except as listed in HPI above).     Objective:   Physical Exam   BP 108/68   Pulse 100   Temp 98.2 F (36.8 C) (Oral)   Wt 144 lb (65.3 kg)   SpO2 98%   BMI 22.55 kg/m  Wt Readings from Last 3 Encounters:  04/15/17 144 lb (65.3 kg)  02/24/17 140 lb 8 oz (63.7 kg)  01/01/17 147 lb (66.7 kg)    General: Appears her stated age, in NAD. ENT: Throat erythematous and  moist, tonsils not swollen, no exudate noted. Abdomen: Soft and nontender. No distention or masses noted. Pelvic: Normal female anatomy. No external lesions noted. Small amount of thin white discharge noted. No odor.  BMET    Component Value Date/Time   NA 140 01/03/2016 1511   K 3.9 01/03/2016 1511   CL 104 01/03/2016 1511   CO2 32 01/03/2016 1511   GLUCOSE 88 01/03/2016 1511   BUN 7 01/03/2016 1511   CREATININE 0.84 01/03/2016 1511   CALCIUM 9.4 01/03/2016 1511   GFRNONAA >60 08/19/2015 0158   GFRAA >60 08/19/2015 0158    Lipid Panel     Component Value Date/Time   CHOL 210 (H) 01/03/2016 1511   TRIG 58.0 01/03/2016 1511   HDL 44.70 01/03/2016 1511   CHOLHDL 5 01/03/2016 1511   VLDL 11.6 01/03/2016 1511   LDLCALC 154 (H) 01/03/2016 1511    CBC    Component Value Date/Time   WBC 6.1 01/03/2016 1511   RBC 4.45 01/03/2016 1511   HGB 14.2 01/03/2016 1511   HCT 42.4 01/03/2016 1511   PLT 216.0 01/03/2016 1511   MCV 95.3 01/03/2016 1511   MCH 30.6 08/19/2015 0158   MCHC 33.5 01/03/2016 1511   RDW 12.7 01/03/2016 1511    Hgb A1C No results found for: HGBA1C         Assessment & Plan:   Dysuria, Burning Sensation and Vaginal Discharge:  Urinalysis: normal Will send urine culture Will send urine for gonorrhea, chlamydia and trichomonas Wet prep sent off  Push fluids for now Can take AZO OTC Discussed using protection during intercourse if you are not in a monogamous relationship  Sore Throat:  RST: negative Will send G/C throat culture  Will follow up after labs are back, return precautions discussed Webb Silversmith, NP

## 2017-04-16 ENCOUNTER — Ambulatory Visit: Payer: Self-pay | Admitting: Internal Medicine

## 2017-04-16 LAB — URINE CULTURE
MICRO NUMBER:: 81007125
Result:: NO GROWTH
SPECIMEN QUALITY:: ADEQUATE

## 2017-04-16 LAB — WET PREP BY MOLECULAR PROBE
Candida species: NOT DETECTED
Gardnerella vaginalis: NOT DETECTED
MICRO NUMBER:: 81007115
SPECIMEN QUALITY:: ADEQUATE
Trichomonas vaginosis: NOT DETECTED

## 2017-04-16 LAB — C. TRACHOMATIS/N. GONORRHOEAE RNA
C. trachomatis RNA, TMA: NOT DETECTED
N. gonorrhoeae RNA, TMA: NOT DETECTED

## 2017-04-18 LAB — GC/CHLAMYDIA PROBE, AMP (THROAT)
CHLAMYDIA TRACHOMATIS RNA, TMA, THROAT: NOT DETECTED
NEISSERIA GONORRHOEAE RNA, TMA, THROAT: NOT DETECTED

## 2017-05-18 ENCOUNTER — Ambulatory Visit: Payer: Self-pay | Admitting: Internal Medicine

## 2017-05-19 ENCOUNTER — Encounter: Payer: Self-pay | Admitting: Obstetrics and Gynecology

## 2017-05-19 ENCOUNTER — Ambulatory Visit (INDEPENDENT_AMBULATORY_CARE_PROVIDER_SITE_OTHER): Payer: BLUE CROSS/BLUE SHIELD | Admitting: Obstetrics and Gynecology

## 2017-05-19 VITALS — BP 124/89 | HR 99 | Ht 67.0 in | Wt 143.6 lb

## 2017-05-19 DIAGNOSIS — Z01419 Encounter for gynecological examination (general) (routine) without abnormal findings: Secondary | ICD-10-CM

## 2017-05-19 MED ORDER — FLUCONAZOLE 150 MG PO TABS: 150.0000 mg | ORAL_TABLET | Freq: Once | ORAL | 3 refills | Status: AC

## 2017-05-19 NOTE — Patient Instructions (Signed)
Preventive Care 18-39 Years, Female Preventive care refers to lifestyle choices and visits with your health care provider that can promote health and wellness. What does preventive care include?  A yearly physical exam. This is also called an annual well check.  Dental exams once or twice a year.  Routine eye exams. Ask your health care provider how often you should have your eyes checked.  Personal lifestyle choices, including: ? Daily care of your teeth and gums. ? Regular physical activity. ? Eating a healthy diet. ? Avoiding tobacco and drug use. ? Limiting alcohol use. ? Practicing safe sex. ? Taking vitamin and mineral supplements as recommended by your health care provider. What happens during an annual well check? The services and screenings done by your health care provider during your annual well check will depend on your age, overall health, lifestyle risk factors, and family history of disease. Counseling Your health care provider may ask you questions about your:  Alcohol use.  Tobacco use.  Drug use.  Emotional well-being.  Home and relationship well-being.  Sexual activity.  Eating habits.  Work and work Statistician.  Method of birth control.  Menstrual cycle.  Pregnancy history.  Screening You may have the following tests or measurements:  Height, weight, and BMI.  Diabetes screening. This is done by checking your blood sugar (glucose) after you have not eaten for a while (fasting).  Blood pressure.  Lipid and cholesterol levels. These may be checked every 5 years starting at age 38.  Skin check.  Hepatitis C blood test.  Hepatitis B blood test.  Sexually transmitted disease (STD) testing.  BRCA-related cancer screening. This may be done if you have a family history of breast, ovarian, tubal, or peritoneal cancers.  Pelvic exam and Pap test. This may be done every 3 years starting at age 38. Starting at age 30, this may be done  every 5 years if you have a Pap test in combination with an HPV test.  Discuss your test results, treatment options, and if necessary, the need for more tests with your health care provider. Vaccines Your health care provider may recommend certain vaccines, such as:  Influenza vaccine. This is recommended every year.  Tetanus, diphtheria, and acellular pertussis (Tdap, Td) vaccine. You may need a Td booster every 10 years.  Varicella vaccine. You may need this if you have not been vaccinated.  HPV vaccine. If you are 39 or younger, you may need three doses over 6 months.  Measles, mumps, and rubella (MMR) vaccine. You may need at least one dose of MMR. You may also need a second dose.  Pneumococcal 13-valent conjugate (PCV13) vaccine. You may need this if you have certain conditions and were not previously vaccinated.  Pneumococcal polysaccharide (PPSV23) vaccine. You may need one or two doses if you smoke cigarettes or if you have certain conditions.  Meningococcal vaccine. One dose is recommended if you are age 68-21 years and a first-year college student living in a residence hall, or if you have one of several medical conditions. You may also need additional booster doses.  Hepatitis A vaccine. You may need this if you have certain conditions or if you travel or work in places where you may be exposed to hepatitis A.  Hepatitis B vaccine. You may need this if you have certain conditions or if you travel or work in places where you may be exposed to hepatitis B.  Haemophilus influenzae type b (Hib) vaccine. You may need this  if you have certain risk factors.  Talk to your health care provider about which screenings and vaccines you need and how often you need them. This information is not intended to replace advice given to you by your health care provider. Make sure you discuss any questions you have with your health care provider. Document Released: 09/16/2001 Document Revised:  04/09/2016 Document Reviewed: 05/22/2015 Elsevier Interactive Patient Education  2017 Elsevier Inc.  

## 2017-05-19 NOTE — Progress Notes (Signed)
Subjective:   Sonia Smith is a 36 y.o. G44P2002 Caucasian female here for a routine well-woman exam.  No LMP recorded. Patient has had a hysterectomy.    Current complaints: ? Yeast infection from antibiotics- used Monistat last night. PCP: Baity       doesn't desire labs-PCP does it.  Social History: Sexual: heterosexual Marital Status: divorced Living situation: with family Occupation: on permanent disability Tobacco/alcohol: no tobacco use Illicit drugs: no history of illicit drug use  The following portions of the patient's history were reviewed and updated as appropriate: allergies, current medications, past family history, past medical history, past social history, past surgical history and problem list.  Past Medical History Past Medical History:  Diagnosis Date  . Blood in stool   . 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 Depression    in high school  . GERD (gastroesophageal reflux disease)   . Lupus    tested positive many years ago, most recent came back negative.  . Migraines   . Painful intercourse   . Pudendal neuralgia   . Vaginal Pap smear, abnormal     Past Surgical History Past Surgical History:  Procedure Laterality Date  . ABDOMINAL HYSTERECTOMY  2008  . 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 CERVICAL CERCLAGE  2001, 2004  . HEMORRHOID SURGERY N/A 11/06/2015   Procedure: HEMORRHOIDECTOMY;  Surgeon: Leonie Green, MD;  Location: [facility] ORS;  Service: General;  Laterality: N/A;  . LEEP  1998   dysplasia    Gynecologic History [id number]  No LMP recorded. Patient has had a hysterectomy. Contraception: status post hysterectomy Last Pap: 2017. Results were: normal   Obstetric History OB History  Gravida Para Term Preterm AB Living  2 2 2  0 0 2  SAB TAB Ectopic Multiple Live Births  0 0 0 0 2    # Outcome Date GA Lbr Len/2nd Weight Sex Delivery Anes PTL Lv  2 Term 2004 [redacted]w[redacted]d  7 lb (3.175 kg) M Vag-Spont  N  LIV  1 Term 2002 [redacted]w[redacted]d  7 lb 8 oz (3.402 kg) F Vag-Spont  Y LIV      Current Medications Current Outpatient Prescriptions on File Prior to Visit  Medication Sig Dispense Refill  . acetaminophen-codeine (TYLENOL #4) 300-60 MG tablet Take by mouth.    . docusate sodium (COLACE) 250 MG capsule Take by mouth.    . gabapentin (NEURONTIN) 600 MG tablet Take 2 tablets by mouth 2 (two) times daily. Take 2 tabs AM, 2 tabs at noon and 1.5 tab QHS    . tretinoin microspheres (RETIN-A MICRO) 0.04 % gel Apply topically.    Marland Kitchen acyclovir (ZOVIRAX) 800 MG tablet take 1 tablet by mouth every morning (Patient not taking: Reported on 05/19/2017) 90 tablet 4  . cyclobenzaprine (FLEXERIL) 10 MG tablet     . ibuprofen (ADVIL,MOTRIN) 200 MG tablet Take 400 mg by mouth every 6 (six) hours as needed for fever, headache or mild pain.    . phenazopyridine (PYRIDIUM) 95 MG tablet Take by mouth.     No current facility-administered medications on file prior to visit.     Review of Systems Patient denies any headaches, blurred vision, shortness of breath, chest pain, abdominal pain, problems with bowel movements, urination, or intercourse.  Objective:  BP 124/89   Pulse 99   Ht 5\' 7"  (1.702 m)   Wt 143 lb 9.6 oz (65.1 kg)  BMI 22.49 kg/m  Physical Exam  General:  Well developed, well nourished, no acute distress. She is alert and oriented x3. Skin:  Warm and dry Neck:  Midline trachea, no thyromegaly or nodules Cardiovascular: Regular rate and rhythm, no murmur heard Lungs:  Effort normal, all lung fields clear to auscultation bilaterally Breasts:  No dominant palpable mass, retraction, or nipple discharge Abdomen:  Soft, non tender, no hepatosplenomegaly or masses Pelvic:  External genitalia is normal in appearance.  The vagina is normal in appearance. The cervix is bulbous, no CMT.  Thin prep pap is not done. Uterus is surgically absent.  No adnexal masses or tenderness noted. Extremities:  No swelling or  varicosities noted Psych:  She has a normal mood and affect  Assessment:   Healthy well-woman exam S/p hystercetomy  Plan:   F/U 1 year for AE, or sooner if needed   Krishiv Sandler Rockney Ghee, CNM

## 2017-06-07 ENCOUNTER — Encounter: Payer: Self-pay | Admitting: Obstetrics and Gynecology

## 2017-06-11 ENCOUNTER — Encounter: Payer: Self-pay | Admitting: Obstetrics and Gynecology

## 2017-06-16 DIAGNOSIS — L249 Irritant contact dermatitis, unspecified cause: Secondary | ICD-10-CM | POA: Diagnosis not present

## 2017-06-16 DIAGNOSIS — L700 Acne vulgaris: Secondary | ICD-10-CM | POA: Diagnosis not present

## 2017-06-18 DIAGNOSIS — R102 Pelvic and perineal pain: Secondary | ICD-10-CM | POA: Diagnosis not present

## 2017-06-18 DIAGNOSIS — N3010 Interstitial cystitis (chronic) without hematuria: Secondary | ICD-10-CM | POA: Diagnosis not present

## 2017-07-09 ENCOUNTER — Ambulatory Visit: Payer: BLUE CROSS/BLUE SHIELD | Admitting: Primary Care

## 2017-07-09 ENCOUNTER — Encounter: Payer: Self-pay | Admitting: Primary Care

## 2017-07-09 DIAGNOSIS — R102 Pelvic and perineal pain: Secondary | ICD-10-CM

## 2017-07-09 DIAGNOSIS — G8929 Other chronic pain: Secondary | ICD-10-CM

## 2017-07-09 LAB — POC URINALSYSI DIPSTICK (AUTOMATED)
Bilirubin, UA: NEGATIVE
Blood, UA: NEGATIVE
Glucose, UA: NEGATIVE
Ketones, UA: NEGATIVE
Leukocytes, UA: NEGATIVE
Nitrite, UA: NEGATIVE
Protein, UA: NEGATIVE
Spec Grav, UA: 1.02 (ref 1.010–1.025)
Urobilinogen, UA: 0.2 E.U./dL
pH, UA: 6 (ref 5.0–8.0)

## 2017-07-09 NOTE — Progress Notes (Signed)
Subjective:    Patient ID: Sonia Smith, female    DOB: September 07, 1980, 36 y.o.   MRN: [id number]  HPI  Sonia Smith is a 36 year old female with a history of chronic pelvic pain, interstitial cystitis, UTI, STD who presents today with a chief complaint of pelvic pressure. She is currently following with Urology that feels as though her pelvic pain is secondary to pudendal neuralgia. She is managed on Macrobid post coiling.   She also reports dysuria. Her gabapentin was decreased to 600 mg BID on 06/18/17 and her symptoms began two weeks ago. She increased her dose to 1200 mg in the morning and [phone] mg at bedtime to see if this would help with her pain, no improvement in pain. She's been sitting on ice packs.   She denies vaginal itching, vaginal discharge, hematuria, flank pain, nausea.   Review of Systems  Constitutional: Negative for fever.  Gastrointestinal: Negative for abdominal pain and nausea.  Genitourinary: Positive for dysuria. Negative for flank pain, hematuria and vaginal discharge.       Suprapubic pressure       Past Medical History:  Diagnosis Date  . Blood in stool   . 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 Depression    in high school  . GERD (gastroesophageal reflux disease)   . Lupus    tested positive many years ago, most recent came back negative.  . Migraines   . Painful intercourse   . Pudendal neuralgia   . Vaginal Pap smear, abnormal      Social History   Socioeconomic History  . Marital status: Married    Spouse name: Not on file  . Number of children: Not on file  . Years of education: Not on file  . Highest education level: Not on file  Social Needs  . Financial resource strain: Not on file  . Food insecurity - worry: Not on file  . Food insecurity - inability: Not on file  . Transportation needs - medical: Not on file  . Transportation needs - non-medical: Not on file  Occupational History  . Not on file  Tobacco Use  .  Smoking status: Former Smoker    Last attempt to quit: 10/08/1999    Years since quitting: 17.7  . Smokeless tobacco: Never Used  . Tobacco comment: quit 2001  Substance and Sexual Activity  . Alcohol use: No    Alcohol/week: 0.0 oz  . Drug use: No  . Sexual activity: Yes    Birth control/protection: Surgical  Other Topics Concern  . Not on file  Social History Narrative  . Not on file    Past Surgical History:  Procedure Laterality Date  . ABDOMINAL HYSTERECTOMY  2008  . 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 CERVICAL CERCLAGE  2001, 2004  . HEMORRHOID SURGERY N/A 11/06/2015   Procedure: HEMORRHOIDECTOMY;  Surgeon: Leonie Green, MD;  Location: [facility] ORS;  Service: General;  Laterality: N/A;  . LEEP  1998   dysplasia    Family History  Problem Relation Age of Onset  . Diabetes Mother   . Hypertension Mother   . Cancer Father        lung  . Diabetes Father   . Hypertension Father   . Heart disease Maternal Grandmother   . Heart disease Maternal Grandfather   . Cancer Paternal Grandfather   . Stroke Neg Hx  Allergies  Allergen Reactions  . Polycarbophil Other (See Comments)    Redness, skin peeling  . Adhesive [Tape] Rash    From bandaides. Tape is OK.    Current Outpatient Medications on File Prior to Visit  Medication Sig Dispense Refill  . acetaminophen-codeine (TYLENOL #4) 300-60 MG tablet Take by mouth.    . cyclobenzaprine (FLEXERIL) 10 MG tablet     . docusate sodium (COLACE) 250 MG capsule Take by mouth.    . gabapentin (NEURONTIN) 600 MG tablet Take 2 tabs AM and 2 tab QHS    . hydrOXYzine (VISTARIL) 50 MG capsule Take 50 mg by mouth 3 (three) times daily as needed.    Marland Kitchen ibuprofen (ADVIL,MOTRIN) 200 MG tablet Take 400 mg by mouth every 6 (six) hours as needed for fever, headache or mild pain.    . phenazopyridine (PYRIDIUM) 95 MG tablet Take by mouth.    . spironolactone (ALDACTONE) 50 MG tablet Take 50-100 mg by  mouth.    . tretinoin microspheres (RETIN-A MICRO) 0.04 % gel Apply topically.     No current facility-administered medications on file prior to visit.     BP 106/70   Pulse (!) 106   Temp 98.2 F (36.8 C) (Oral)   Ht 5\' 7"  (1.702 m)   Wt 148 lb 1.9 oz (67.2 kg)   SpO2 98%   BMI 23.20 kg/m    Objective:   Physical Exam  Constitutional: She appears well-nourished.  Neck: Neck supple.  Cardiovascular: Normal rate and normal heart sounds.  Pulmonary/Chest: Effort normal and breath sounds normal.  Abdominal: Soft. Bowel sounds are normal. There is no tenderness. There is no CVA tenderness.  Skin: Skin is warm and dry.          Assessment & Plan:

## 2017-07-09 NOTE — Assessment & Plan Note (Signed)
UA today negative. No symptoms of vaginal involvement, patient declines pelvic exam. Suspect symptoms are related to chronic neuralgia and will likely need to increase gabapentin up to a therapeutic level.  She will contact her Urologist regarding today's visit.

## 2017-07-09 NOTE — Patient Instructions (Signed)
There is no evidence of infection in your urine today.  Contact your Urologist regarding your symptoms as you may need further dose adjustments on the gabapentin.  It was a pleasure meeting you!

## 2017-07-09 NOTE — Addendum Note (Signed)
Addended by: Jacqualin Combes on: 07/09/2017 10:10 AM   Modules accepted: Orders

## 2017-08-03 ENCOUNTER — Encounter: Payer: Self-pay | Admitting: Internal Medicine

## 2017-08-04 DIAGNOSIS — N10 Acute tubulo-interstitial nephritis: Secondary | ICD-10-CM | POA: Diagnosis not present

## 2017-08-04 DIAGNOSIS — R000 Tachycardia, unspecified: Secondary | ICD-10-CM | POA: Diagnosis not present

## 2017-08-04 DIAGNOSIS — J9600 Acute respiratory failure, unspecified whether with hypoxia or hypercapnia: Secondary | ICD-10-CM | POA: Diagnosis not present

## 2017-08-04 DIAGNOSIS — T426X1A Poisoning by other antiepileptic and sedative-hypnotic drugs, accidental (unintentional), initial encounter: Secondary | ICD-10-CM | POA: Diagnosis not present

## 2017-08-04 DIAGNOSIS — G9340 Encephalopathy, unspecified: Secondary | ICD-10-CM | POA: Diagnosis not present

## 2017-08-04 DIAGNOSIS — B9620 Unspecified Escherichia coli [E. coli] as the cause of diseases classified elsewhere: Secondary | ICD-10-CM | POA: Diagnosis not present

## 2017-08-04 DIAGNOSIS — I82611 Acute embolism and thrombosis of superficial veins of right upper extremity: Secondary | ICD-10-CM | POA: Diagnosis not present

## 2017-08-04 DIAGNOSIS — N3010 Interstitial cystitis (chronic) without hematuria: Secondary | ICD-10-CM | POA: Diagnosis not present

## 2017-08-04 DIAGNOSIS — N390 Urinary tract infection, site not specified: Secondary | ICD-10-CM | POA: Diagnosis not present

## 2017-08-04 DIAGNOSIS — A419 Sepsis, unspecified organism: Secondary | ICD-10-CM | POA: Diagnosis not present

## 2017-08-04 DIAGNOSIS — T428X1A Poisoning by antiparkinsonism drugs and other central muscle-tone depressants, accidental (unintentional), initial encounter: Secondary | ICD-10-CM | POA: Diagnosis not present

## 2017-08-04 DIAGNOSIS — E538 Deficiency of other specified B group vitamins: Secondary | ICD-10-CM | POA: Diagnosis not present

## 2017-08-04 DIAGNOSIS — N12 Tubulo-interstitial nephritis, not specified as acute or chronic: Secondary | ICD-10-CM | POA: Diagnosis not present

## 2017-08-04 DIAGNOSIS — G92 Toxic encephalopathy: Secondary | ICD-10-CM | POA: Diagnosis not present

## 2017-08-04 DIAGNOSIS — R109 Unspecified abdominal pain: Secondary | ICD-10-CM | POA: Diagnosis not present

## 2017-08-04 DIAGNOSIS — I82622 Acute embolism and thrombosis of deep veins of left upper extremity: Secondary | ICD-10-CM | POA: Diagnosis not present

## 2017-08-04 DIAGNOSIS — N3289 Other specified disorders of bladder: Secondary | ICD-10-CM | POA: Diagnosis not present

## 2017-08-04 DIAGNOSIS — T730XXA Starvation, initial encounter: Secondary | ICD-10-CM | POA: Diagnosis not present

## 2017-08-04 DIAGNOSIS — R188 Other ascites: Secondary | ICD-10-CM | POA: Diagnosis not present

## 2017-08-04 DIAGNOSIS — J811 Chronic pulmonary edema: Secondary | ICD-10-CM | POA: Diagnosis not present

## 2017-08-04 DIAGNOSIS — R410 Disorientation, unspecified: Secondary | ICD-10-CM | POA: Diagnosis not present

## 2017-08-04 DIAGNOSIS — T50902A Poisoning by unspecified drugs, medicaments and biological substances, intentional self-harm, initial encounter: Secondary | ICD-10-CM | POA: Diagnosis not present

## 2017-08-04 DIAGNOSIS — T426X4A Poisoning by other antiepileptic and sedative-hypnotic drugs, undetermined, initial encounter: Secondary | ICD-10-CM | POA: Diagnosis not present

## 2017-08-04 DIAGNOSIS — B379 Candidiasis, unspecified: Secondary | ICD-10-CM | POA: Diagnosis not present

## 2017-08-04 DIAGNOSIS — R4182 Altered mental status, unspecified: Secondary | ICD-10-CM | POA: Diagnosis not present

## 2017-08-04 DIAGNOSIS — M792 Neuralgia and neuritis, unspecified: Secondary | ICD-10-CM | POA: Diagnosis not present

## 2017-08-04 DIAGNOSIS — E870 Hyperosmolality and hypernatremia: Secondary | ICD-10-CM | POA: Diagnosis not present

## 2017-08-04 DIAGNOSIS — R825 Elevated urine levels of drugs, medicaments and biological substances: Secondary | ICD-10-CM | POA: Diagnosis not present

## 2017-08-04 DIAGNOSIS — T50901A Poisoning by unspecified drugs, medicaments and biological substances, accidental (unintentional), initial encounter: Secondary | ICD-10-CM | POA: Diagnosis not present

## 2017-08-04 DIAGNOSIS — I5030 Unspecified diastolic (congestive) heart failure: Secondary | ICD-10-CM | POA: Diagnosis not present

## 2017-08-04 DIAGNOSIS — T148XXA Other injury of unspecified body region, initial encounter: Secondary | ICD-10-CM | POA: Diagnosis not present

## 2017-08-04 DIAGNOSIS — E8889 Other specified metabolic disorders: Secondary | ICD-10-CM | POA: Diagnosis not present

## 2017-08-04 DIAGNOSIS — R45851 Suicidal ideations: Secondary | ICD-10-CM | POA: Diagnosis not present

## 2017-08-04 DIAGNOSIS — R197 Diarrhea, unspecified: Secondary | ICD-10-CM | POA: Diagnosis not present

## 2017-08-04 DIAGNOSIS — I82623 Acute embolism and thrombosis of deep veins of upper extremity, bilateral: Secondary | ICD-10-CM | POA: Diagnosis not present

## 2017-08-05 DIAGNOSIS — R4182 Altered mental status, unspecified: Secondary | ICD-10-CM | POA: Diagnosis not present

## 2017-08-05 DIAGNOSIS — R410 Disorientation, unspecified: Secondary | ICD-10-CM | POA: Diagnosis not present

## 2017-08-05 DIAGNOSIS — N390 Urinary tract infection, site not specified: Secondary | ICD-10-CM | POA: Diagnosis not present

## 2017-08-06 ENCOUNTER — Ambulatory Visit: Payer: Self-pay | Admitting: Internal Medicine

## 2017-08-06 DIAGNOSIS — Z0289 Encounter for other administrative examinations: Secondary | ICD-10-CM

## 2017-08-06 DIAGNOSIS — R4182 Altered mental status, unspecified: Secondary | ICD-10-CM | POA: Diagnosis not present

## 2017-08-06 DIAGNOSIS — R410 Disorientation, unspecified: Secondary | ICD-10-CM | POA: Diagnosis not present

## 2017-08-06 DIAGNOSIS — T148XXA Other injury of unspecified body region, initial encounter: Secondary | ICD-10-CM | POA: Diagnosis not present

## 2017-08-06 DIAGNOSIS — N390 Urinary tract infection, site not specified: Secondary | ICD-10-CM | POA: Diagnosis not present

## 2017-08-06 DIAGNOSIS — A419 Sepsis, unspecified organism: Secondary | ICD-10-CM | POA: Diagnosis not present

## 2017-08-06 NOTE — Progress Notes (Deleted)
Subjective:    Patient ID: Sonia Smith, female    DOB: 1981/02/11, 37 y.o.   MRN: [id number]  HPI  Pt presents to the clinic today for STD screening. She was sexually active with 1 partner who recently tested positive for Chlamydia. They were not using condoms.  Review of Systems  Past Medical History:  Diagnosis Date  . Blood in stool   . 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 Depression    in high school  . GERD (gastroesophageal reflux disease)   . Lupus    tested positive many years ago, most recent came back negative.  . Migraines   . Painful intercourse   . Pudendal neuralgia   . Vaginal Pap smear, abnormal     Current Outpatient Medications  Medication Sig Dispense Refill  . acetaminophen-codeine (TYLENOL #4) 300-60 MG tablet Take by mouth.    . cyclobenzaprine (FLEXERIL) 10 MG tablet     . docusate sodium (COLACE) 250 MG capsule Take by mouth.    . gabapentin (NEURONTIN) 600 MG tablet Take 2 tabs AM and 2 tab QHS    . hydrOXYzine (VISTARIL) 50 MG capsule Take 50 mg by mouth 3 (three) times daily as needed.    Marland Kitchen ibuprofen (ADVIL,MOTRIN) 200 MG tablet Take 400 mg by mouth every 6 (six) hours as needed for fever, headache or mild pain.    . phenazopyridine (PYRIDIUM) 95 MG tablet Take by mouth.    . spironolactone (ALDACTONE) 50 MG tablet Take 50-100 mg by mouth.    . tretinoin microspheres (RETIN-A MICRO) 0.04 % gel Apply topically.     No current facility-administered medications for this visit.     Allergies  Allergen Reactions  . Polycarbophil Other (See Comments)    Redness, skin peeling  . Adhesive [Tape] Rash    From bandaides. Tape is OK.    Family History  Problem Relation Age of Onset  . Diabetes Mother   . Hypertension Mother   . Cancer Father        lung  . Diabetes Father   . Hypertension Father   . Heart disease Maternal Grandmother   . Heart disease Maternal Grandfather   . Cancer Paternal Grandfather   . Stroke Neg Hx      Social History   Socioeconomic History  . Marital status: Married    Spouse name: Not on file  . Number of children: Not on file  . Years of education: Not on file  . Highest education level: Not on file  Social Needs  . Financial resource strain: Not on file  . Food insecurity - worry: Not on file  . Food insecurity - inability: Not on file  . Transportation needs - medical: Not on file  . Transportation needs - non-medical: Not on file  Occupational History  . Not on file  Tobacco Use  . Smoking status: Former Smoker    Last attempt to quit: 10/08/1999    Years since quitting: 17.8  . Smokeless tobacco: Never Used  . Tobacco comment: quit 2001  Substance and Sexual Activity  . Alcohol use: No    Alcohol/week: 0.0 oz  . Drug use: No  . Sexual activity: Yes    Birth control/protection: Surgical  Other Topics Concern  . Not on file  Social History Narrative  . Not on file     Constitutional: Denies fever, malaise, fatigue, headache or abrupt weight changes.  HEENT: Denies eye pain,  eye redness, ear pain, ringing in the ears, wax buildup, runny nose, nasal congestion, bloody nose, or sore throat. Respiratory: Denies difficulty breathing, shortness of breath, cough or sputum production.   Cardiovascular: Denies chest pain, chest tightness, palpitations or swelling in the hands or feet.  Gastrointestinal: Denies abdominal pain, bloating, constipation, diarrhea or blood in the stool.  GU: Denies urgency, frequency, pain with urination, burning sensation, blood in urine, odor or discharge. Musculoskeletal: Denies decrease in range of motion, difficulty with gait, muscle pain or joint pain and swelling.  Skin: Denies redness, rashes, lesions or ulcercations.  Neurological: Denies dizziness, difficulty with memory, difficulty with speech or problems with balance and coordination.  Psych: Denies anxiety, depression, SI/HI.  No other specific complaints in a complete review of  systems (except as listed in HPI above).     Objective:   Physical Exam        Assessment & Plan:

## 2017-08-07 DIAGNOSIS — R410 Disorientation, unspecified: Secondary | ICD-10-CM | POA: Diagnosis not present

## 2017-08-07 DIAGNOSIS — I5030 Unspecified diastolic (congestive) heart failure: Secondary | ICD-10-CM | POA: Diagnosis not present

## 2017-08-07 DIAGNOSIS — R4182 Altered mental status, unspecified: Secondary | ICD-10-CM | POA: Diagnosis not present

## 2017-08-07 DIAGNOSIS — N390 Urinary tract infection, site not specified: Secondary | ICD-10-CM | POA: Diagnosis not present

## 2017-08-08 DIAGNOSIS — R4182 Altered mental status, unspecified: Secondary | ICD-10-CM | POA: Diagnosis not present

## 2017-08-08 DIAGNOSIS — R000 Tachycardia, unspecified: Secondary | ICD-10-CM | POA: Diagnosis not present

## 2017-08-08 DIAGNOSIS — N390 Urinary tract infection, site not specified: Secondary | ICD-10-CM | POA: Diagnosis not present

## 2017-08-09 DIAGNOSIS — B379 Candidiasis, unspecified: Secondary | ICD-10-CM | POA: Diagnosis not present

## 2017-08-09 DIAGNOSIS — R4182 Altered mental status, unspecified: Secondary | ICD-10-CM | POA: Diagnosis not present

## 2017-08-09 DIAGNOSIS — N390 Urinary tract infection, site not specified: Secondary | ICD-10-CM | POA: Diagnosis not present

## 2017-08-10 DIAGNOSIS — N390 Urinary tract infection, site not specified: Secondary | ICD-10-CM | POA: Diagnosis not present

## 2017-08-10 DIAGNOSIS — I82611 Acute embolism and thrombosis of superficial veins of right upper extremity: Secondary | ICD-10-CM | POA: Diagnosis not present

## 2017-08-10 DIAGNOSIS — R4182 Altered mental status, unspecified: Secondary | ICD-10-CM | POA: Diagnosis not present

## 2017-08-10 DIAGNOSIS — I82623 Acute embolism and thrombosis of deep veins of upper extremity, bilateral: Secondary | ICD-10-CM | POA: Diagnosis not present

## 2017-08-10 DIAGNOSIS — R197 Diarrhea, unspecified: Secondary | ICD-10-CM | POA: Diagnosis not present

## 2017-08-10 DIAGNOSIS — R410 Disorientation, unspecified: Secondary | ICD-10-CM | POA: Diagnosis not present

## 2017-08-10 DIAGNOSIS — R188 Other ascites: Secondary | ICD-10-CM | POA: Diagnosis not present

## 2017-08-10 DIAGNOSIS — R109 Unspecified abdominal pain: Secondary | ICD-10-CM | POA: Diagnosis not present

## 2017-08-10 DIAGNOSIS — N3010 Interstitial cystitis (chronic) without hematuria: Secondary | ICD-10-CM | POA: Diagnosis not present

## 2017-08-10 DIAGNOSIS — N3289 Other specified disorders of bladder: Secondary | ICD-10-CM | POA: Diagnosis not present

## 2017-08-11 DIAGNOSIS — T426X4A Poisoning by other antiepileptic and sedative-hypnotic drugs, undetermined, initial encounter: Secondary | ICD-10-CM | POA: Diagnosis not present

## 2017-08-11 DIAGNOSIS — G9340 Encephalopathy, unspecified: Secondary | ICD-10-CM | POA: Diagnosis not present

## 2017-08-11 DIAGNOSIS — I82622 Acute embolism and thrombosis of deep veins of left upper extremity: Secondary | ICD-10-CM | POA: Diagnosis not present

## 2017-08-11 DIAGNOSIS — N10 Acute tubulo-interstitial nephritis: Secondary | ICD-10-CM | POA: Diagnosis not present

## 2017-08-11 DIAGNOSIS — M792 Neuralgia and neuritis, unspecified: Secondary | ICD-10-CM | POA: Diagnosis not present

## 2017-08-12 ENCOUNTER — Encounter: Payer: Self-pay | Admitting: Internal Medicine

## 2017-08-12 DIAGNOSIS — G9340 Encephalopathy, unspecified: Secondary | ICD-10-CM | POA: Insufficient documentation

## 2017-08-12 DIAGNOSIS — F332 Major depressive disorder, recurrent severe without psychotic features: Secondary | ICD-10-CM | POA: Diagnosis not present

## 2017-08-12 DIAGNOSIS — I82622 Acute embolism and thrombosis of deep veins of left upper extremity: Secondary | ICD-10-CM | POA: Diagnosis not present

## 2017-08-12 DIAGNOSIS — M792 Neuralgia and neuritis, unspecified: Secondary | ICD-10-CM | POA: Insufficient documentation

## 2017-08-12 DIAGNOSIS — M79601 Pain in right arm: Secondary | ICD-10-CM | POA: Diagnosis not present

## 2017-08-12 DIAGNOSIS — F339 Major depressive disorder, recurrent, unspecified: Secondary | ICD-10-CM | POA: Insufficient documentation

## 2017-08-12 DIAGNOSIS — N10 Acute tubulo-interstitial nephritis: Secondary | ICD-10-CM | POA: Diagnosis not present

## 2017-08-12 DIAGNOSIS — R102 Pelvic and perineal pain: Secondary | ICD-10-CM | POA: Diagnosis not present

## 2017-08-12 DIAGNOSIS — R410 Disorientation, unspecified: Secondary | ICD-10-CM | POA: Diagnosis not present

## 2017-08-12 DIAGNOSIS — T428X1A Poisoning by antiparkinsonism drugs and other central muscle-tone depressants, accidental (unintentional), initial encounter: Secondary | ICD-10-CM | POA: Insufficient documentation

## 2017-08-12 DIAGNOSIS — T426X4A Poisoning by other antiepileptic and sedative-hypnotic drugs, undetermined, initial encounter: Secondary | ICD-10-CM | POA: Diagnosis not present

## 2017-08-12 DIAGNOSIS — G4700 Insomnia, unspecified: Secondary | ICD-10-CM | POA: Diagnosis not present

## 2017-08-12 DIAGNOSIS — G8929 Other chronic pain: Secondary | ICD-10-CM | POA: Diagnosis not present

## 2017-08-13 DIAGNOSIS — F332 Major depressive disorder, recurrent severe without psychotic features: Secondary | ICD-10-CM | POA: Diagnosis not present

## 2017-08-13 DIAGNOSIS — R102 Pelvic and perineal pain: Secondary | ICD-10-CM | POA: Diagnosis not present

## 2017-08-13 DIAGNOSIS — M79601 Pain in right arm: Secondary | ICD-10-CM | POA: Diagnosis not present

## 2017-08-13 DIAGNOSIS — I82622 Acute embolism and thrombosis of deep veins of left upper extremity: Secondary | ICD-10-CM | POA: Diagnosis not present

## 2017-08-14 DIAGNOSIS — F332 Major depressive disorder, recurrent severe without psychotic features: Secondary | ICD-10-CM | POA: Diagnosis not present

## 2017-08-18 ENCOUNTER — Encounter: Payer: Self-pay | Admitting: Internal Medicine

## 2017-08-18 ENCOUNTER — Ambulatory Visit: Payer: BLUE CROSS/BLUE SHIELD | Admitting: Internal Medicine

## 2017-08-18 VITALS — BP 128/84 | HR 110 | Temp 97.9°F | Wt 142.0 lb

## 2017-08-18 DIAGNOSIS — I82622 Acute embolism and thrombosis of deep veins of left upper extremity: Secondary | ICD-10-CM

## 2017-08-18 DIAGNOSIS — G8929 Other chronic pain: Secondary | ICD-10-CM

## 2017-08-18 DIAGNOSIS — R102 Pelvic and perineal pain: Secondary | ICD-10-CM | POA: Diagnosis not present

## 2017-08-18 DIAGNOSIS — T50904D Poisoning by unspecified drugs, medicaments and biological substances, undetermined, subsequent encounter: Secondary | ICD-10-CM | POA: Diagnosis not present

## 2017-08-18 DIAGNOSIS — N12 Tubulo-interstitial nephritis, not specified as acute or chronic: Secondary | ICD-10-CM

## 2017-08-18 NOTE — Progress Notes (Signed)
Subjective:    Patient ID: Sonia Smith, female    DOB: 07-Oct-1980, 37 y.o.   MRN: [id number]  HPI  Pt presents to the clinic today for hospital follow up. She was taken to the ER 1/4 with AMS, lethargy. Her husband reported she tried to overdose on Baclofen, Gabapentin and Imipramine. In the ER, she was delusional, paranoid, yelling and cussing. MR of the brain was normal. CT scan of the abdomen was concerning for pyelonephritis, so she was treated with Ceftin. She was voluntarily admitted. Psychiarty was consulted. The d/c'd her Flexeril and Tylenol #4. They started her on Cymbalta 20 mg and restarted her Gabapentin 100 mg BID. Admission was complicated by a DVT of LUE, SVT of RUE. She was started on Eliquis. She was discharge 08/14/17. Since discharge, she reports she is doing well. She is only taking what is prescribed. She has her appointments set up with psychology, psychiatry and urogynecology. She denies anxiety, depression, SI/HI.     Review of Systems      Past Medical History:  Diagnosis Date  . Blood in stool   . 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 Depression    in high school  . GERD (gastroesophageal reflux disease)   . Lupus    tested positive many years ago, most recent came back negative.  . Migraines   . Painful intercourse   . Pudendal neuralgia   . Vaginal Pap smear, abnormal     Current Outpatient Medications  Medication Sig Dispense Refill  . acetaminophen-codeine (TYLENOL #4) 300-60 MG tablet Take by mouth.    . cyclobenzaprine (FLEXERIL) 10 MG tablet     . docusate sodium (COLACE) 250 MG capsule Take by mouth.    . gabapentin (NEURONTIN) 600 MG tablet Take 2 tabs AM and 2 tab QHS    . hydrOXYzine (VISTARIL) 50 MG capsule Take 50 mg by mouth 3 (three) times daily as needed.    Marland Kitchen ibuprofen (ADVIL,MOTRIN) 200 MG tablet Take 400 mg by mouth every 6 (six) hours as needed for fever, headache or mild pain.    . phenazopyridine (PYRIDIUM) 95 MG tablet  Take by mouth.    . spironolactone (ALDACTONE) 50 MG tablet Take 50-100 mg by mouth.    . tretinoin microspheres (RETIN-A MICRO) 0.04 % gel Apply topically.     No current facility-administered medications for this visit.     Allergies  Allergen Reactions  . Polycarbophil Other (See Comments)    Redness, skin peeling  . Adhesive [Tape] Rash    From bandaides. Tape is OK.    Family History  Problem Relation Age of Onset  . Diabetes Mother   . Hypertension Mother   . Cancer Father        lung  . Diabetes Father   . Hypertension Father   . Heart disease Maternal Grandmother   . Heart disease Maternal Grandfather   . Cancer Paternal Grandfather   . Stroke Neg Hx     Social History   Socioeconomic History  . Marital status: Married    Spouse name: Not on file  . Number of children: Not on file  . Years of education: Not on file  . Highest education level: Not on file  Social Needs  . Financial resource strain: Not on file  . Food insecurity - worry: Not on file  . Food insecurity - inability: Not on file  . Transportation needs - medical: Not on file  .  Transportation needs - non-medical: Not on file  Occupational History  . Not on file  Tobacco Use  . Smoking status: Former Smoker    Last attempt to quit: 10/08/1999    Years since quitting: 17.8  . Smokeless tobacco: Never Used  . Tobacco comment: quit 2001  Substance and Sexual Activity  . Alcohol use: No    Alcohol/week: 0.0 oz  . Drug use: No  . Sexual activity: Yes    Birth control/protection: Surgical  Other Topics Concern  . Not on file  Social History Narrative  . Not on file     Constitutional: Denies fever, malaise, fatigue, headache or abrupt weight changes.  GU: Pt reports chronic pelvic pain. Denies urgency, frequency, pain with urination, burning sensation, blood in urine, odor or discharge. Psych: Denies anxiety, depression, SI/HI.  No other specific complaints in a complete review of  systems (except as listed in HPI above).  Objective:   Physical Exam   BP 128/84   Pulse (!) 110   Temp 97.9 F (36.6 C) (Oral)   Wt 142 lb (64.4 kg)   SpO2 99%   BMI 22.24 kg/m  Wt Readings from Last 3 Encounters:  08/18/17 142 lb (64.4 kg)  07/09/17 148 lb 1.9 oz (67.2 kg)  05/19/17 143 lb 9.6 oz (65.1 kg)    General: Appears her stated age, well developed, well nourished in NAD. Cardiovascular: Tachycardic with normal rhythm. S1,S2 noted.  No murmur, rubs or gallops noted.  Pulmonary/Chest: Normal effort and positive vesicular breath sounds. No respiratory distress. No wheezes, rales or ronchi noted.  Abdomen: Soft and nontender. Normal bowel sounds. No distention or masses noted.  Neurological: Alert and oriented.   Psychiatric: Mood and affect normal. Behavior is normal. Judgment and thought content normal.     BMET    Component Value Date/Time   NA 140 01/03/2016 1511   K 3.9 01/03/2016 1511   CL 104 01/03/2016 1511   CO2 32 01/03/2016 1511   GLUCOSE 88 01/03/2016 1511   BUN 7 01/03/2016 1511   CREATININE 0.84 01/03/2016 1511   CALCIUM 9.4 01/03/2016 1511   GFRNONAA >60 08/19/2015 0158   GFRAA >60 08/19/2015 0158    Lipid Panel     Component Value Date/Time   CHOL 210 (H) 01/03/2016 1511   TRIG 58.0 01/03/2016 1511   HDL 44.70 01/03/2016 1511   CHOLHDL 5 01/03/2016 1511   VLDL 11.6 01/03/2016 1511   LDLCALC 154 (H) 01/03/2016 1511    CBC    Component Value Date/Time   WBC 6.1 01/03/2016 1511   RBC 4.45 01/03/2016 1511   HGB 14.2 01/03/2016 1511   HCT 42.4 01/03/2016 1511   PLT 216.0 01/03/2016 1511   MCV 95.3 01/03/2016 1511   MCH 30.6 08/19/2015 0158   MCHC 33.5 01/03/2016 1511   RDW 12.7 01/03/2016 1511    Hgb A1C No results found for: HGBA1C         Assessment & Plan:   Hospital Follow Up for Drug Overdose, Pyelonephritis, Chronic Pelvic Pain, DVT:  Hospital notes, labs and imaging reviewed She will finish her course of Ceftin  as prescribed No need to repeat UA or culture We reviewed meds and she is taking only what is prescribed. She is requesting testing for Chlamydia again- I will not test her as she has had 3 recent negative tests Advised her to follow up with Dr. Amalia Smith regarding chronic pelvic pain Will refer the hematology for follow up DVT  Return precautions discussed Webb Silversmith, NP

## 2017-08-18 NOTE — Patient Instructions (Signed)
Deep Vein Thrombosis Deep vein thrombosis (DVT) is a condition in which a blood clot forms in a deep vein, such as a lower leg, thigh, or arm vein. A clot is blood that has thickened into a gel or solid. This condition is dangerous. It can lead to serious and even life-threatening complications if the clot travels to the lungs and causes a blockage (pulmonary embolism). It can also damage veins in the leg. This can result in leg pain, swelling, discoloration, and sores (post-thrombotic syndrome). What are the causes? This condition may be caused by:  A slowdown of blood flow.  Damage to a vein.  A condition that makes blood clot more easily.  What increases the risk? The following factors may make you more likely to develop this condition:  Being overweight.  Being elderly, especially over age 60.  Sitting or lying down for more than four hours.  Lack of physical activity (sedentary lifestyle).  Being pregnant, giving birth, or having recently given birth.  Taking medicines that contain estrogen.  Smoking.  A history of any of the following: ? Blood clots or blood clotting disease. ? Peripheral vascular disease. ? Inflammatory bowel disease. ? Cancer. ? Heart disease. ? Genetic conditions that affect how blood clots. ? Neurological diseases that affect the legs (leg paresis). ? Injury. ? Major or lengthy surgery. ? A central line placed inside a large vein.  What are the signs or symptoms? Symptoms of this condition include:  Swelling, pain, or tenderness in an arm or leg.  Warmth, redness, or discoloration in an arm or leg.  If the clot is in your leg, symptoms may be more noticeable or worse when you stand or walk. Some people do not have any symptoms. How is this diagnosed? This condition is diagnosed with:  A medical history.  A physical exam.  Tests, such as: ? Blood tests. These are done to see how your blood clots. ? Imaging tests. These are done to  check for clots. Tests may include:  Ultrasound.  CT scan.  MRI.  X-ray.  Venogram. For this test, X-rays are taken after a dye is injected into a vein.  How is this treated? Treatment for this condition depends on the cause, your risk for bleeding or developing more clots, and any medical conditions you have. Treatment may include:  Taking blood thinners (also called anticoagulants). These medicines may be taken by mouth, injected under the skin, or injected through an IV tube (catheter). These medicines prevent clots from forming.  Injecting medicine that dissolves blood clots into the affected vein (catheter-directed thrombolysis).  Having surgery. Surgery may be done to: ? Remove the clot. ? Place a filter in a large vein to catch blood clots before they reach the lungs.  Some treatments may be continued for up to six months. Follow these instructions at home: If you are taking an oral blood thinner:  Take the medicine exactly as told by your health care provider. Some blood thinners need to be taken at the same time every day. Do not skip a dose.  Ask your health care provider about what foods and drugs interact with the medicine.  Ask about possible side effects. General instructions  Blood thinners can cause easy bruising and difficulty stopping bleeding. Because of this, if you are taking or were given a blood thinner: ? Hold pressure over cuts for longer than usual. ? Tell your dentist and other health care providers that you are taking blood thinners before   having any procedures that can cause bleeding. ? Avoid contact sports.  Take over-the-counter and prescription medicines only as told by your health care provider.  Return to your normal activities as told by your health care provider. Ask your health care provider what activities are safe for you.  Wear compression stockings if recommended by your health care provider.  Keep all follow-up visits as told by  your health care provider. This is important. How is this prevented? To lower your risk of developing this condition again:  For 30 or more minutes every day, do an activity that: ? Involves moving your arms and legs. ? Increases your heart rate.  When traveling for longer than four hours: ? Exercise your arms and legs every hour. ? Drink plenty of water. ? Avoid drinking alcohol.  Avoid sitting or lying for a long time without moving your legs.  Stay a healthy weight.  If you are a woman who is older than age 35, avoid unnecessary use of medicines that contain estrogen.  Do not use any products that contain nicotine or tobacco, such as cigarettes and e-cigarettes. This is especially important if you take estrogen medicines. If you need help quitting, ask your health care provider.  Contact a health care provider if:  You miss a dose of your blood thinner.  You have nausea, vomiting, or diarrhea that lasts for more than one day.  Your menstrual period is heavier than usual.  You have unusual bruising. Get help right away if:  You have new or increased pain, swelling, or redness in an arm or leg.  You have numbness or tingling in an arm or leg.  You have shortness of breath.  You have chest pain.  You have a rapid or irregular heartbeat.  You feel light-headed or dizzy.  You cough up blood.  There is blood in your vomit, stool, or urine.  You have a serious fall or accident, or you hit your head.  You have a severe headache or confusion.  You have a cut that will not stop bleeding. These symptoms may represent a serious problem that is an emergency. Do not wait to see if the symptoms will go away. Get medical help right away. Call your local emergency services (911 in the [place] Do not drive yourself to the hospital. Summary  DVT is a condition in which a blood clot forms in a deep vein, such as a lower leg, thigh, or arm vein.  Symptoms can include swelling,  warmth, pain, and redness in your leg or arm.  Treatment may include taking blood thinners, injecting medicine that dissolves blood clots,wearing compression stockings, or surgery.  If you are prescribed blood thinners, take them exactly as told. This information is not intended to replace advice given to you by your health care provider. Make sure you discuss any questions you have with your health care provider. Document Released: 07/21/2005 Document Revised: 08/23/2016 Document Reviewed: 08/23/2016 Elsevier Interactive Patient Education  2018 Elsevier Inc.  

## 2017-08-19 ENCOUNTER — Encounter: Payer: Self-pay | Admitting: Internal Medicine

## 2017-08-21 ENCOUNTER — Encounter: Payer: Self-pay | Admitting: Internal Medicine

## 2017-08-21 DIAGNOSIS — T1491XA Suicide attempt, initial encounter: Secondary | ICD-10-CM

## 2017-08-21 DIAGNOSIS — F419 Anxiety disorder, unspecified: Secondary | ICD-10-CM

## 2017-08-21 DIAGNOSIS — F329 Major depressive disorder, single episode, unspecified: Secondary | ICD-10-CM

## 2017-08-21 DIAGNOSIS — F32A Depression, unspecified: Secondary | ICD-10-CM

## 2017-08-25 ENCOUNTER — Ambulatory Visit: Payer: BLUE CROSS/BLUE SHIELD | Admitting: Internal Medicine

## 2017-08-25 ENCOUNTER — Encounter: Payer: Self-pay | Admitting: Internal Medicine

## 2017-08-25 VITALS — BP 120/78 | HR 85 | Temp 98.0°F | Wt 148.0 lb

## 2017-08-25 DIAGNOSIS — R21 Rash and other nonspecific skin eruption: Secondary | ICD-10-CM

## 2017-08-25 DIAGNOSIS — R300 Dysuria: Secondary | ICD-10-CM

## 2017-08-25 LAB — POC URINALSYSI DIPSTICK (AUTOMATED)
Bilirubin, UA: NEGATIVE
Glucose, UA: NEGATIVE
Ketones, UA: NEGATIVE
Nitrite, UA: NEGATIVE
Protein, UA: NEGATIVE
Spec Grav, UA: >=1.03 — AB (ref 1.010–1.025)
Urobilinogen, UA: 0.2 E.U./dL
pH, UA: 6 (ref 5.0–8.0)

## 2017-08-25 MED ORDER — METHYLPREDNISOLONE ACETATE 80 MG/ML IJ SUSP
80.0000 mg | Freq: Once | INTRAMUSCULAR | Status: AC
  Administered 2017-08-25: 80 mg via INTRAMUSCULAR

## 2017-08-25 MED ORDER — CIPROFLOXACIN HCL 500 MG PO TABS: 500.0000 mg | ORAL_TABLET | Freq: Two times a day (BID) | ORAL | 0 refills | Status: DC

## 2017-08-26 LAB — URINE CULTURE
MICRO NUMBER:: 90090648
Result:: NO GROWTH
SPECIMEN QUALITY:: ADEQUATE

## 2017-08-26 LAB — C. TRACHOMATIS/N. GONORRHOEAE RNA
C. trachomatis RNA, TMA: NOT DETECTED
N. gonorrhoeae RNA, TMA: NOT DETECTED

## 2017-08-27 ENCOUNTER — Inpatient Hospital Stay: Payer: BLUE CROSS/BLUE SHIELD | Attending: Oncology | Admitting: Oncology

## 2017-08-27 ENCOUNTER — Encounter: Payer: Self-pay | Admitting: Oncology

## 2017-08-27 ENCOUNTER — Other Ambulatory Visit: Payer: Self-pay

## 2017-08-27 VITALS — BP 129/93 | HR 108 | Temp 99.6°F | Resp 20 | Wt 142.0 lb

## 2017-08-27 DIAGNOSIS — M329 Systemic lupus erythematosus, unspecified: Secondary | ICD-10-CM | POA: Diagnosis not present

## 2017-08-27 DIAGNOSIS — Z7901 Long term (current) use of anticoagulants: Secondary | ICD-10-CM | POA: Diagnosis not present

## 2017-08-27 DIAGNOSIS — Z90710 Acquired absence of both cervix and uterus: Secondary | ICD-10-CM | POA: Diagnosis not present

## 2017-08-27 DIAGNOSIS — K219 Gastro-esophageal reflux disease without esophagitis: Secondary | ICD-10-CM

## 2017-08-27 DIAGNOSIS — I82622 Acute embolism and thrombosis of deep veins of left upper extremity: Secondary | ICD-10-CM | POA: Diagnosis not present

## 2017-08-27 DIAGNOSIS — I82611 Acute embolism and thrombosis of superficial veins of right upper extremity: Secondary | ICD-10-CM

## 2017-08-27 DIAGNOSIS — Z79899 Other long term (current) drug therapy: Secondary | ICD-10-CM | POA: Diagnosis not present

## 2017-08-27 DIAGNOSIS — R5381 Other malaise: Secondary | ICD-10-CM | POA: Diagnosis not present

## 2017-08-27 DIAGNOSIS — Z87891 Personal history of nicotine dependence: Secondary | ICD-10-CM | POA: Insufficient documentation

## 2017-08-27 DIAGNOSIS — R5383 Other fatigue: Secondary | ICD-10-CM | POA: Insufficient documentation

## 2017-08-27 NOTE — Progress Notes (Signed)
Hematology/Oncology Consult note [facility] [phone] [phone]  Patient Care Team: Jearld Fenton, NP as PCP - General (Internal Medicine)   Name of the patient: Sonia Smith  [id number]  01-04-1981    Reason for referral- upper extremity DVT   Referring physician- Dr. Garnette Gunner  Date of visit: 08/27/17   History of presenting illness-patient is a 37 year old female who was recently admitted to [facility] on 08/12/2017 for possible overdose on baclofen gabapentin and phenazopyridine on 08/04/2017.  She did not have any prior psychiatric history.  She was admitted to the hospital for about 11 days.  Patient tells me that she was not exactly sure how all this happened.  She was having some symptoms of UTI and probably took extra doses of pain medication to relieve her pain and was found to be paranoid and delusional.  During that hospitalization she did have multiple attempts at IV line insertions and says that she woke up having multiple bruises over bilateral upper extremities due to attempted IV line insertions.  A few days following that she was complaining of pain and swelling in her extremities for which Doppler of bilateral extremities was done and she was found to have a DVT in her left brachial vein and right cephalic vein.  Patient denies any prior history of DVT or PE.  She is G4 P2 L2A2.  She did not have any problems during her prior pregnancies.  No family history of DVT or PE  Patient reports no bleeding on xarelto. She has develop skin rash over extremities over last coupld of weeks. She was started on cymbalta and eliquis around the same time   ECOG PS- 0  Pain scale- 0   Review of systems- Review of Systems  Constitutional: Positive for malaise/fatigue. Negative for chills, fever and weight loss.  HENT: Negative for congestion, ear discharge and nosebleeds.   Eyes: Negative for blurred vision.  Respiratory: Negative for  cough, hemoptysis, sputum production, shortness of breath and wheezing.   Cardiovascular: Negative for chest pain, palpitations, orthopnea and claudication.  Gastrointestinal: Negative for abdominal pain, blood in stool, constipation, diarrhea, heartburn, melena, nausea and vomiting.  Genitourinary: Negative for dysuria, flank pain, frequency, hematuria and urgency.  Musculoskeletal: Negative for back pain, joint pain and myalgias.  Skin: Positive for rash.  Neurological: Negative for dizziness, tingling, focal weakness, seizures, weakness and headaches.  Endo/Heme/Allergies: Does not bruise/bleed easily.  Psychiatric/Behavioral: Negative for depression and suicidal ideas. The patient does not have insomnia.     Allergies  Allergen Reactions  . Polycarbophil Other (See Comments)    Redness, skin peeling  . Adhesive [Tape] Rash    From bandaides. Tape is OK.    Patient Active Problem List   Diagnosis Date Noted  . Hemorrhoids 06/11/2016  . Chronic constipation 06/11/2016  . Adjustment disorder with depressed mood   . Childhood asthma 09/04/2014  . Migraines 09/04/2014  . GERD (gastroesophageal reflux disease) 09/04/2014  . Chronic pelvic pain in female 09/04/2014     Past Medical History:  Diagnosis Date  . Blood in stool   . 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 Depression    in high school  . GERD (gastroesophageal reflux disease)   . Lupus    tested positive many years ago, most recent came back negative.  . Migraines   . Painful intercourse   . Pudendal neuralgia   . Vaginal Pap smear, abnormal  Past Surgical History:  Procedure Laterality Date  . ABDOMINAL HYSTERECTOMY  2008  . 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 CERVICAL CERCLAGE  2001, 2004  . HEMORRHOID SURGERY N/A 11/06/2015   Procedure: HEMORRHOIDECTOMY;  Surgeon: Leonie Green, MD;  Location: [facility] ORS;  Service: General;  Laterality: N/A;  . LEEP  1998    dysplasia    Social History   Socioeconomic History  . Marital status: Married    Spouse name: Not on file  . Number of children: Not on file  . Years of education: Not on file  . Highest education level: Not on file  Social Needs  . Financial resource strain: Not on file  . Food insecurity - worry: Not on file  . Food insecurity - inability: Not on file  . Transportation needs - medical: Not on file  . Transportation needs - non-medical: Not on file  Occupational History  . Not on file  Tobacco Use  . Smoking status: Former Smoker    Last attempt to quit: 10/08/1999    Years since quitting: 17.8  . Smokeless tobacco: Never Used  . Tobacco comment: quit 2001  Substance and Sexual Activity  . Alcohol use: No    Alcohol/week: 0.0 oz  . Drug use: No  . Sexual activity: Yes    Birth control/protection: Surgical  Other Topics Concern  . Not on file  Social History Narrative  . Not on file     Family History  Problem Relation Age of Onset  . Diabetes Mother   . Hypertension Mother   . Cancer Father        lung  . Diabetes Father   . Hypertension Father   . Heart disease Maternal Grandmother   . Heart disease Maternal Grandfather   . Cancer Maternal Grandfather   . Cancer Paternal Grandfather   . Cancer Other   . Cancer Other   . Stroke Neg Hx      Current Outpatient Medications:  .  ciprofloxacin (CIPRO) 500 MG tablet, Take 1 tablet (500 mg total) by mouth 2 (two) times daily., Disp: 10 tablet, Rfl: 0 .  docusate sodium (COLACE) 250 MG capsule, Take by mouth., Disp: , Rfl:  .  DULoxetine (CYMBALTA) 20 MG capsule, Take 1 capsule by mouth daily., Disp: , Rfl:  .  ELIQUIS 5 MG TABS tablet, Take 5 mg by mouth 2 (two) times daily., Disp: , Rfl: 0 .  gabapentin (NEURONTIN) 100 MG capsule, Take 1 capsule by mouth 2 (two) times daily., Disp: , Rfl:  .  spironolactone (ALDACTONE) 50 MG tablet, Take 50-100 mg by mouth., Disp: , Rfl:  .  tretinoin microspheres (RETIN-A  MICRO) 0.04 % gel, Apply topically., Disp: , Rfl:    Physical exam:  Vitals:   08/27/17 1014  BP: (!) 129/93  Pulse: (!) 108  Resp: 20  Temp: 99.6 F (37.6 C)  TempSrc: Tympanic  Weight: 142 lb (64.4 kg)   Physical Exam  Constitutional: She is oriented to person, place, and time and well-developed, well-nourished, and in no distress.  HENT:  Head: Normocephalic and atraumatic.  Eyes: EOM are normal. Pupils are equal, round, and reactive to light.  Neck: Normal range of motion.  Cardiovascular: Normal rate, regular rhythm and normal heart sounds.  Pulmonary/Chest: Effort normal and breath sounds normal.  Abdominal: Soft. Bowel sounds are normal.  Neurological: She is alert and oriented to person, place, and time.  Skin: Skin is warm and dry.  Scattered erythematous macular lesions sub cm seen over b/l extremities       CMP Latest Ref Rng & Units 01/03/2016  Glucose 70 - 99 mg/dL 88  BUN 6 - 23 mg/dL 7  Creatinine 0.40 - 1.20 mg/dL 0.84  Sodium 135 - 145 mEq/L 140  Potassium 3.5 - 5.1 mEq/L 3.9  Chloride 96 - 112 mEq/L 104  CO2 19 - 32 mEq/L 32  Calcium 8.4 - 10.5 mg/dL 9.4  Total Protein 6.0 - 8.3 g/dL 7.2  Total Bilirubin 0.2 - 1.2 mg/dL 0.4  Alkaline Phos 39 - 117 U/L 48  AST 0 - 37 U/L 14  ALT 0 - 35 U/L 15   CBC Latest Ref Rng & Units 01/03/2016  WBC 4.0 - 10.5 K/uL 6.1  Hemoglobin 12.0 - 15.0 g/dL 14.2  Hematocrit 36.0 - 46.0 % 42.4  Platelets 150.0 - 400.0 K/uL 216.0     Assessment and plan- Patient is a 37 y.o. female referred for left upper extremity DVT  I discussed the Doppler results with the patient.  Patient was found to have a right upper extremity superficial vein thrombosis and a DVT in her left upper extremity in the brachial vein.  This was likely provoked secondary to multiple attempts at IV line insertion during her initial hospitalization.  Patient has not had any prior history of DVT PE or family history.  Also there was no involvement of  axillary or subclavian vein that could suggest thoracic outlet syndrome causing DVT of the upper extremity.  I would recommend 3 months of anticoagulation for upper extremity DVT.  She does not require any hypercoagulable workup at this time.  If patient were to have any recurrent episodes of DVT or PE in the future she can be referred back to [place].  Patient is currently not on any birth control.  She is status post hysterectomy.  She has used birth control in the past prior to hysterectomy and did not have any DVT or PE due to that either.  She does not require hematology follow-up at this time but can be referred to [place] in the future if questions or concerns arise  Skin rash unlikely due to eliquis. I would recommend continuing that for now. If rash worsens, could consider switching to xarelto   Thank you for this kind referral and the opportunity to participate in the care of this patient   Visit Diagnosis 1. Acute deep vein thrombosis (DVT) of brachial vein of left upper extremity (HCC)     Dr. Randa Evens, MD, MPH [place] [facility] Pager- [phone] 08/27/2017

## 2017-08-28 ENCOUNTER — Encounter: Payer: Self-pay | Admitting: Internal Medicine

## 2017-08-28 NOTE — Patient Instructions (Signed)

## 2017-08-28 NOTE — Progress Notes (Signed)
Subjective:    Patient ID: Sonia Smith, female    DOB: 12/04/1980, 37 y.o.   MRN: [id number]  HPI  Pt presents to the clinic today with c/o a rash. She reports this started 10 days ago. The rash is all over her body. It is itchy. She reports it has gotten better. She has tried some Benadryl OTC. Her concern is that she may be having an allergic reaction to the Eliquis.  She also reports dysuria. This started 3 days ago. She was recently treated during a hospital admission for a UTI and she does not think it has resolved. She would also like to be tested for chlamydia. She reports she keeps testing negative but her boyfriend keeps testing positive. She denies vaginal discharge.  Review of Systems      Past Medical History:  Diagnosis Date  . Blood in stool   . 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 Depression    in high school  . GERD (gastroesophageal reflux disease)   . Lupus    tested positive many years ago, most recent came back negative.  . Migraines   . Painful intercourse   . Pudendal neuralgia   . Vaginal Pap smear, abnormal     Current Outpatient Medications  Medication Sig Dispense Refill  . docusate sodium (COLACE) 250 MG capsule Take by mouth.    . DULoxetine (CYMBALTA) 20 MG capsule Take 1 capsule by mouth daily.    Marland Kitchen ELIQUIS 5 MG TABS tablet Take 5 mg by mouth 2 (two) times daily.  0  . gabapentin (NEURONTIN) 100 MG capsule Take 1 capsule by mouth 2 (two) times daily.    Marland Kitchen spironolactone (ALDACTONE) 50 MG tablet Take 50-100 mg by mouth.    . tretinoin microspheres (RETIN-A MICRO) 0.04 % gel Apply topically.    . ciprofloxacin (CIPRO) 500 MG tablet Take 1 tablet (500 mg total) by mouth 2 (two) times daily. 10 tablet 0   No current facility-administered medications for this visit.     Allergies  Allergen Reactions  . Polycarbophil Other (See Comments)    Redness, skin peeling  . Adhesive [Tape] Rash    From bandaides. Tape is OK.    Family  History  Problem Relation Age of Onset  . Diabetes Mother   . Hypertension Mother   . Cancer Father        lung  . Diabetes Father   . Hypertension Father   . Heart disease Maternal Grandmother   . Heart disease Maternal Grandfather   . Cancer Maternal Grandfather   . Cancer Paternal Grandfather   . Cancer Other   . Cancer Other   . Stroke Neg Hx     Social History   Socioeconomic History  . Marital status: Married    Spouse name: Not on file  . Number of children: Not on file  . Years of education: Not on file  . Highest education level: Not on file  Social Needs  . Financial resource strain: Not on file  . Food insecurity - worry: Not on file  . Food insecurity - inability: Not on file  . Transportation needs - medical: Not on file  . Transportation needs - non-medical: Not on file  Occupational History  . Not on file  Tobacco Use  . Smoking status: Former Smoker    Last attempt to quit: 10/08/1999    Years since quitting: 17.9  . Smokeless tobacco: Never Used  .  Tobacco comment: quit 2001  Substance and Sexual Activity  . Alcohol use: No    Alcohol/week: 0.0 oz  . Drug use: No  . Sexual activity: Yes    Birth control/protection: Surgical  Other Topics Concern  . Not on file  Social History Narrative  . Not on file     Constitutional: Denies fever, malaise, fatigue, headache or abrupt weight changes.  GU: Pt reports dysuria. Denies urgency, frequency, burning sensation, blood in urine, odor or discharge. Skin: Pt reports rash. Denies redness, lesions or ulcercations.    No other specific complaints in a complete review of systems (except as listed in HPI above).  Objective:   Physical Exam   BP 120/78   Pulse 85   Temp 98 F (36.7 C) (Oral)   Wt 148 lb (67.1 kg)   SpO2 98%   BMI 23.18 kg/m  Wt Readings from Last 3 Encounters:  08/27/17 142 lb (64.4 kg)  08/25/17 148 lb (67.1 kg)  08/18/17 142 lb (64.4 kg)    General: Appears her stated age,  well developed, well nourished in NAD. Skin: Scattered, scabbed, punctate lesions not on bilateral arms and legs.  Abdomen: Soft and nontender. Normal bowel sounds. No distention or masses noted.   BMET    Component Value Date/Time   NA 140 01/03/2016 1511   K 3.9 01/03/2016 1511   CL 104 01/03/2016 1511   CO2 32 01/03/2016 1511   GLUCOSE 88 01/03/2016 1511   BUN 7 01/03/2016 1511   CREATININE 0.84 01/03/2016 1511   CALCIUM 9.4 01/03/2016 1511   GFRNONAA >60 08/19/2015 0158   GFRAA >60 08/19/2015 0158    Lipid Panel     Component Value Date/Time   CHOL 210 (H) 01/03/2016 1511   TRIG 58.0 01/03/2016 1511   HDL 44.70 01/03/2016 1511   CHOLHDL 5 01/03/2016 1511   VLDL 11.6 01/03/2016 1511   LDLCALC 154 (H) 01/03/2016 1511    CBC    Component Value Date/Time   WBC 6.1 01/03/2016 1511   RBC 4.45 01/03/2016 1511   HGB 14.2 01/03/2016 1511   HCT 42.4 01/03/2016 1511   PLT 216.0 01/03/2016 1511   MCV 95.3 01/03/2016 1511   MCH 30.6 08/19/2015 0158   MCHC 33.5 01/03/2016 1511   RDW 12.7 01/03/2016 1511    Hgb A1C No results found for: HGBA1C         Assessment & Plan:   Rash:  Almost resolved 80 mg Depo IM today Can start taking Zyrtec OTC daily x 1 week if still itching  Dysuria:  Urinalysis: 3+ leuks, 3+ blood Will send urine culture eRx for Cipro 500 mg BID x 5 days Will obtain urine G/C Push fluids Ok to take AZO OTC  Return precautions discussed Webb Silversmith, NP

## 2017-09-01 DIAGNOSIS — L700 Acne vulgaris: Secondary | ICD-10-CM | POA: Diagnosis not present

## 2017-09-16 ENCOUNTER — Encounter: Payer: Self-pay | Admitting: Internal Medicine

## 2017-09-17 ENCOUNTER — Other Ambulatory Visit: Payer: Self-pay | Admitting: Internal Medicine

## 2017-09-17 MED ORDER — ELIQUIS 5 MG PO TABS: 5.0000 mg | ORAL_TABLET | Freq: Two times a day (BID) | ORAL | 1 refills | Status: DC

## 2017-09-21 ENCOUNTER — Encounter: Payer: Self-pay | Admitting: Internal Medicine

## 2017-09-21 ENCOUNTER — Ambulatory Visit: Payer: BLUE CROSS/BLUE SHIELD | Admitting: Internal Medicine

## 2017-09-21 VITALS — BP 118/80 | HR 90 | Temp 98.3°F | Wt 152.0 lb

## 2017-09-21 DIAGNOSIS — R21 Rash and other nonspecific skin eruption: Secondary | ICD-10-CM

## 2017-09-21 DIAGNOSIS — Z8261 Family history of arthritis: Secondary | ICD-10-CM

## 2017-09-21 LAB — HIGH SENSITIVITY CRP: CRP, High Sensitivity: 1.67 mg/L (ref 0.000–5.000)

## 2017-09-21 LAB — SEDIMENTATION RATE: Sed Rate: 7 mm/hr (ref 0–20)

## 2017-09-21 MED ORDER — APIXABAN 5 MG PO TABS: 5.0000 mg | ORAL_TABLET | Freq: Two times a day (BID) | ORAL | 1 refills | Status: DC

## 2017-09-21 NOTE — Patient Instructions (Signed)
Antinuclear Antibody Test Why am I having this test? This is a test used to help diagnose systemic lupus erythematosus (SLE) and other autoimmune diseases. An autoimmune disease is a disease in which the body's own defense system (immune system) attacks the body. This test checks for antinuclear antibodies (ANA) in the blood. The presence of ANA is associated with several autoimmune diseases. It is most commonly seen with SLE. What kind of sample is taken? A blood sample is required for this test. It is usually collected by inserting a needle into a vein. How do I prepare for this test? There is no preparation required for this test. How are the results reported? Your test results will be reported as either positive or negative. It is your responsibility to obtain your test results. Ask the lab or department performing the test when and how you will get your results. What do the results mean? A positive test may mean you have:  SLE.  An autoimmune disease.  Liver dysfunction.  Leukemia.  Infectious mononucleosis.  Talk with your health care provider to discuss your results, treatment options, and if necessary, the need for more tests. Talk with your health care provider if you have any questions about your results. Talk with your health care provider to discuss your results, treatment options, and if necessary, the need for more tests. Talk with your health care provider if you have any questions about your results. This information is not intended to replace advice given to you by your health care provider. Make sure you discuss any questions you have with your health care provider. Document Released: 08/12/2004 Document Revised: 03/24/2016 Document Reviewed: 12/20/2013 Elsevier Interactive Patient Education  2018 [employer] Inc.  

## 2017-09-21 NOTE — Progress Notes (Signed)
Subjective:    Patient ID: Sonia Smith, female    DOB: 1981-05-13, 37 y.o.   MRN: [id number]  HPI  Pt presents to the clinic today for follow up. She was admitted for SI, 08/2017. During that admission, she developed LUE DVT, started on Eliquis. Shortly after starting Eliquis, she noted generalized rash, which she assumed was coming from the Eliquis. When I saw her on 08/25/17, there was not much of a rash present. I advised her to continue and referred her to hematology for hypercoagulable workup. They determined that she did not need any additional workup at this time, but wanted her to continue the Eliquis for 3 months. They did advise that if rash persisted or worsened, to try to transition her to Xarelto. She is now concerned that the rash she had was lupus. She reports a dermatologist telling her in the past that she had lupus and rheumatoid arthritis. She would like to be worked up for this today. She denies fatigue, joint pain. She does get rashes intermittently, none today. She has a family history of RA.  Review of Systems  Past Medical History:  Diagnosis Date  . Blood in stool   . 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 Depression    in high school  . GERD (gastroesophageal reflux disease)   . Lupus    tested positive many years ago, most recent came back negative.  . Migraines   . Painful intercourse   . Pudendal neuralgia   . Vaginal Pap smear, abnormal     Current Outpatient Medications  Medication Sig Dispense Refill  . ciprofloxacin (CIPRO) 500 MG tablet Take 1 tablet (500 mg total) by mouth 2 (two) times daily. 10 tablet 0  . docusate sodium (COLACE) 250 MG capsule Take by mouth.    . DULoxetine (CYMBALTA) 20 MG capsule Take 1 capsule by mouth daily.    Marland Kitchen ELIQUIS 5 MG TABS tablet Take 1 tablet (5 mg total) by mouth 2 (two) times daily. 60 tablet 1  . spironolactone (ALDACTONE) 50 MG tablet Take 50-100 mg by mouth.    . tretinoin microspheres (RETIN-A MICRO)  0.04 % gel Apply topically.     No current facility-administered medications for this visit.     Allergies  Allergen Reactions  . Polycarbophil Other (See Comments)    Redness, skin peeling  . Adhesive [Tape] Rash    From bandaides. Tape is OK.    Family History  Problem Relation Age of Onset  . Diabetes Mother   . Hypertension Mother   . Cancer Father        lung  . Diabetes Father   . Hypertension Father   . Heart disease Maternal Grandmother   . Heart disease Maternal Grandfather   . Cancer Maternal Grandfather   . Cancer Paternal Grandfather   . Cancer Other   . Cancer Other   . Stroke Neg Hx     Social History   Socioeconomic History  . Marital status: Married    Spouse name: Not on file  . Number of children: Not on file  . Years of education: Not on file  . Highest education level: Not on file  Social Needs  . Financial resource strain: Not on file  . Food insecurity - worry: Not on file  . Food insecurity - inability: Not on file  . Transportation needs - medical: Not on file  . Transportation needs - non-medical: Not on file  Occupational History  . Not on file  Tobacco Use  . Smoking status: Former Smoker    Last attempt to quit: 10/08/1999    Years since quitting: 17.9  . Smokeless tobacco: Never Used  . Tobacco comment: quit 2001  Substance and Sexual Activity  . Alcohol use: No    Alcohol/week: 0.0 oz  . Drug use: No  . Sexual activity: Yes    Birth control/protection: Surgical  Other Topics Concern  . Not on file  Social History Narrative  . Not on file     Constitutional: Denies fever, malaise, fatigue, headache or abrupt weight changes.  Musculoskeletal: Denies decrease in range of motion, difficulty with gait, muscle pain or joint pain and swelling.  Skin: Pt reports intermittent rashes. Denies rashes, lesions or ulcercations.   No other specific complaints in a complete review of systems (except as listed in HPI above).       Objective:   Physical Exam   BP 118/80   Pulse 90   Temp 98.3 F (36.8 C) (Oral)   Wt 152 lb (68.9 kg)   SpO2 99%   BMI 23.81 kg/m  Wt Readings from Last 3 Encounters:  09/21/17 152 lb (68.9 kg)  08/27/17 142 lb (64.4 kg)  08/25/17 148 lb (67.1 kg)    General: Appears her stated age, in NAD. Skin: Warm, dry and intact. No rashes noted. Musculoskeletal: No signs of joint swelling.    BMET    Component Value Date/Time   NA 140 01/03/2016 1511   K 3.9 01/03/2016 1511   CL 104 01/03/2016 1511   CO2 32 01/03/2016 1511   GLUCOSE 88 01/03/2016 1511   BUN 7 01/03/2016 1511   CREATININE 0.84 01/03/2016 1511   CALCIUM 9.4 01/03/2016 1511   GFRNONAA >60 08/19/2015 0158   GFRAA >60 08/19/2015 0158    Lipid Panel     Component Value Date/Time   CHOL 210 (H) 01/03/2016 1511   TRIG 58.0 01/03/2016 1511   HDL 44.70 01/03/2016 1511   CHOLHDL 5 01/03/2016 1511   VLDL 11.6 01/03/2016 1511   LDLCALC 154 (H) 01/03/2016 1511    CBC    Component Value Date/Time   WBC 6.1 01/03/2016 1511   RBC 4.45 01/03/2016 1511   HGB 14.2 01/03/2016 1511   HCT 42.4 01/03/2016 1511   PLT 216.0 01/03/2016 1511   MCV 95.3 01/03/2016 1511   MCH 30.6 08/19/2015 0158   MCHC 33.5 01/03/2016 1511   RDW 12.7 01/03/2016 1511    Hgb A1C No results found for: HGBA1C         Assessment & Plan:   Intermittent Skin Rashes, Family History of RA:  Will check ANA, RF, ESR and CRP May need referral to rheumatology pending labs  Will follow up after labs are back, return precautions discussed Webb Silversmith, NP

## 2017-09-22 DIAGNOSIS — K620 Anal polyp: Secondary | ICD-10-CM | POA: Diagnosis not present

## 2017-09-22 DIAGNOSIS — K648 Other hemorrhoids: Secondary | ICD-10-CM | POA: Diagnosis not present

## 2017-09-22 LAB — RHEUMATOID FACTOR: Rhuematoid fact SerPl-aCnc: <14 IU/mL (ref <14)

## 2017-09-22 LAB — ANA: Anti Nuclear Antibody(ANA): NEGATIVE

## 2017-09-25 DIAGNOSIS — K620 Anal polyp: Secondary | ICD-10-CM | POA: Diagnosis not present

## 2017-10-13 ENCOUNTER — Encounter: Payer: Self-pay | Admitting: Internal Medicine

## 2017-10-13 ENCOUNTER — Ambulatory Visit: Payer: BLUE CROSS/BLUE SHIELD | Admitting: Internal Medicine

## 2017-10-13 VITALS — BP 116/80 | HR 88 | Temp 98.7°F | Wt 153.0 lb

## 2017-10-13 DIAGNOSIS — N898 Other specified noninflammatory disorders of vagina: Secondary | ICD-10-CM

## 2017-10-13 DIAGNOSIS — L241 Irritant contact dermatitis due to oils and greases: Secondary | ICD-10-CM | POA: Diagnosis not present

## 2017-10-13 MED ORDER — CLOBETASOL PROPIONATE 0.05 % EX CREA: 1.0000 "application " | TOPICAL_CREAM | Freq: Two times a day (BID) | CUTANEOUS | 0 refills | Status: DC

## 2017-10-13 NOTE — Patient Instructions (Signed)
Contact Dermatitis Dermatitis is redness, soreness, and swelling (inflammation) of the skin. Contact dermatitis is a reaction to certain substances that touch the skin. You either touched something that irritated your skin, or you have allergies to something you touched. Follow these instructions at home: Skin Care  Moisturize your skin as needed.  Apply cool compresses to the affected areas.  Try taking a bath with: ? Epsom salts. Follow the instructions on the package. You can get these at a pharmacy or grocery store. ? Baking soda. Pour a small amount into the bath as told by your doctor. ? Colloidal oatmeal. Follow the instructions on the package. You can get this at a pharmacy or grocery store.  Try applying baking soda paste to your skin. Stir water into baking soda until it looks like paste.  Do not scratch your skin.  Bathe less often.  Bathe in lukewarm water. Avoid using hot water. Medicines  Take or apply over-the-counter and prescription medicines only as told by your doctor.  If you were prescribed an antibiotic medicine, take or apply your antibiotic as told by your doctor. Do not stop taking the antibiotic even if your condition starts to get better. General instructions  Keep all follow-up visits as told by your doctor. This is important.  Avoid the substance that caused your reaction. If you do not know what caused it, keep a journal to try to track what caused it. Write down: ? What you eat. ? What cosmetic products you use. ? What you drink. ? What you wear in the affected area. This includes jewelry.  If you were given a bandage (dressing), take care of it as told by your doctor. This includes when to change and remove it. Contact a doctor if:  You do not get better with treatment.  Your condition gets worse.  You have signs of infection such as: ? Swelling. ? Tenderness. ? Redness. ? Soreness. ? Warmth.  You have a fever.  You have new  symptoms. Get help right away if:  You have a very bad headache.  You have neck pain.  Your neck is stiff.  You throw up (vomit).  You feel very sleepy.  You see red streaks coming from the affected area.  Your bone or joint underneath the affected area becomes painful after the skin has healed.  The affected area turns darker.  You have trouble breathing. This information is not intended to replace advice given to you by your health care provider. Make sure you discuss any questions you have with your health care provider. Document Released: 05/18/2009 Document Revised: 12/27/2015 Document Reviewed: 12/06/2014 Elsevier Interactive Patient Education  2018 [employer]  

## 2017-10-13 NOTE — Progress Notes (Signed)
Subjective:    Patient ID: Sonia Smith, female    DOB: 16-Jul-1981, 37 y.o.   MRN: [id number]  HPI  Pt presents to the clinic today with c/o vaginal pain and swelling. She reports 1 week ago, she was getting a massage with baby oil. She thinks some of the baby oil accidentally got inside of her vagina. She has noticed the inner labia is red and swollen. She feels like the skin is falling off her labia. She denies vaginal discharge, odor or abnormal bleeding. She has not tried anything OTC for her symptoms, and feel like they have gotten better.  Review of Systems      Past Medical History:  Diagnosis Date  . Blood in stool   . 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 Depression    in high school  . GERD (gastroesophageal reflux disease)   . Lupus    tested positive many years ago, most recent came back negative.  . Migraines   . Painful intercourse   . Pudendal neuralgia   . Vaginal Pap smear, abnormal     Current Outpatient Medications  Medication Sig Dispense Refill  . apixaban (ELIQUIS) 5 MG TABS tablet Take 1 tablet (5 mg total) by mouth 2 (two) times daily. 60 tablet 1  . docusate sodium (COLACE) 250 MG capsule Take by mouth.    Arne Cleveland 5 MG TABS tablet Take 1 tablet (5 mg total) by mouth 2 (two) times daily. 60 tablet 1  . spironolactone (ALDACTONE) 50 MG tablet Take 50-100 mg by mouth.    . tretinoin microspheres (RETIN-A MICRO) 0.04 % gel Apply topically.    . DULoxetine (CYMBALTA) 20 MG capsule Take 1 capsule by mouth daily.     No current facility-administered medications for this visit.     Allergies  Allergen Reactions  . Polycarbophil Other (See Comments)    Redness, skin peeling  . Adhesive [Tape] Rash    From bandaides. Tape is OK.    Family History  Problem Relation Age of Onset  . Diabetes Mother   . Hypertension Mother   . Cancer Father        lung  . Diabetes Father   . Hypertension Father   . Heart disease Maternal Grandmother   .  Heart disease Maternal Grandfather   . Cancer Maternal Grandfather   . Cancer Paternal Grandfather   . Cancer Other   . Cancer Other   . Stroke Neg Hx     Social History   Socioeconomic History  . Marital status: Married    Spouse name: Not on file  . Number of children: Not on file  . Years of education: Not on file  . Highest education level: Not on file  Social Needs  . Financial resource strain: Not on file  . Food insecurity - worry: Not on file  . Food insecurity - inability: Not on file  . Transportation needs - medical: Not on file  . Transportation needs - non-medical: Not on file  Occupational History  . Not on file  Tobacco Use  . Smoking status: Former Smoker    Last attempt to quit: 10/08/1999    Years since quitting: 18.0  . Smokeless tobacco: Never Used  . Tobacco comment: quit 2001  Substance and Sexual Activity  . Alcohol use: No    Alcohol/week: 0.0 oz  . Drug use: No  . Sexual activity: Yes    Birth control/protection: Surgical  Other Topics Concern  . Not on file  Social History Narrative  . Not on file     Constitutional: Denies fever, malaise, fatigue, headache or abrupt weight changes.  Gastrointestinal: Denies abdominal pain, bloating, constipation, diarrhea or blood in the stool.  GU: Pt reports vaginal pain and irritation. Denies urgency, frequency, pain with urination, burning sensation, blood in urine, odor or discharge.    No other specific complaints in a complete review of systems (except as listed in HPI above).  Objective:   Physical Exam   BP 116/80   Pulse 88   Temp 98.7 F (37.1 C) (Oral)   Wt 153 lb (69.4 kg)   SpO2 98%   BMI 23.96 kg/m   Wt Readings from Last 3 Encounters:  10/13/17 153 lb (69.4 kg)  09/21/17 152 lb (68.9 kg)  08/27/17 142 lb (64.4 kg)    General: Appears her stated age, well developed, well nourished in NAD. Abdomen: Soft and nontender. Normal bowel sounds.  Pelvic: Self swabbed.  BMET      Component Value Date/Time   NA 140 01/03/2016 1511   K 3.9 01/03/2016 1511   CL 104 01/03/2016 1511   CO2 32 01/03/2016 1511   GLUCOSE 88 01/03/2016 1511   BUN 7 01/03/2016 1511   CREATININE 0.84 01/03/2016 1511   CALCIUM 9.4 01/03/2016 1511   GFRNONAA >60 08/19/2015 0158   GFRAA >60 08/19/2015 0158    Lipid Panel     Component Value Date/Time   CHOL 210 (H) 01/03/2016 1511   TRIG 58.0 01/03/2016 1511   HDL 44.70 01/03/2016 1511   CHOLHDL 5 01/03/2016 1511   VLDL 11.6 01/03/2016 1511   LDLCALC 154 (H) 01/03/2016 1511    CBC    Component Value Date/Time   WBC 6.1 01/03/2016 1511   RBC 4.45 01/03/2016 1511   HGB 14.2 01/03/2016 1511   HCT 42.4 01/03/2016 1511   PLT 216.0 01/03/2016 1511   MCV 95.3 01/03/2016 1511   MCH 30.6 08/19/2015 0158   MCHC 33.5 01/03/2016 1511   RDW 12.7 01/03/2016 1511    Hgb A1C No results found for: HGBA1C         Assessment & Plan:   Contact Dermatitis, Vaginal:  Will send urine gonorrhea, chlamydia and trichomonas Wet prep eRx for Clobetasol cream BID  Return precautions discussed Webb Silversmith, NP

## 2017-10-13 NOTE — Addendum Note (Signed)
Addended by: Lurlean Nanny on: 10/13/2017 03:45 PM   Modules accepted: Orders

## 2017-10-14 DIAGNOSIS — J301 Allergic rhinitis due to pollen: Secondary | ICD-10-CM | POA: Diagnosis not present

## 2017-10-14 DIAGNOSIS — J3489 Other specified disorders of nose and nasal sinuses: Secondary | ICD-10-CM | POA: Diagnosis not present

## 2017-10-14 DIAGNOSIS — H903 Sensorineural hearing loss, bilateral: Secondary | ICD-10-CM | POA: Diagnosis not present

## 2017-10-14 LAB — WET PREP BY MOLECULAR PROBE
Candida species: NOT DETECTED
Gardnerella vaginalis: NOT DETECTED
MICRO NUMBER:: 90315089
SPECIMEN QUALITY:: ADEQUATE
Trichomonas vaginosis: NOT DETECTED

## 2017-10-14 LAB — C. TRACHOMATIS/N. GONORRHOEAE RNA
C. trachomatis RNA, TMA: NOT DETECTED
N. gonorrhoeae RNA, TMA: NOT DETECTED

## 2017-10-15 ENCOUNTER — Ambulatory Visit: Payer: Self-pay | Admitting: Internal Medicine

## 2017-10-17 ENCOUNTER — Encounter: Payer: Self-pay | Admitting: Internal Medicine

## 2017-10-28 ENCOUNTER — Encounter: Payer: Self-pay | Admitting: Internal Medicine

## 2017-11-05 ENCOUNTER — Ambulatory Visit: Payer: BLUE CROSS/BLUE SHIELD | Admitting: Internal Medicine

## 2017-11-09 ENCOUNTER — Encounter: Payer: Self-pay | Admitting: Internal Medicine

## 2017-11-25 DIAGNOSIS — H903 Sensorineural hearing loss, bilateral: Secondary | ICD-10-CM | POA: Diagnosis not present

## 2017-11-25 DIAGNOSIS — J3489 Other specified disorders of nose and nasal sinuses: Secondary | ICD-10-CM | POA: Diagnosis not present

## 2017-12-03 ENCOUNTER — Encounter: Payer: Self-pay | Admitting: Internal Medicine

## 2017-12-03 DIAGNOSIS — F329 Major depressive disorder, single episode, unspecified: Secondary | ICD-10-CM

## 2017-12-03 DIAGNOSIS — F32A Depression, unspecified: Secondary | ICD-10-CM

## 2017-12-03 DIAGNOSIS — F419 Anxiety disorder, unspecified: Principal | ICD-10-CM

## 2017-12-07 ENCOUNTER — Telehealth: Payer: Self-pay | Admitting: Internal Medicine

## 2017-12-07 NOTE — Telephone Encounter (Signed)
She has been having a lot of mood issues lately. She has a psych referral pending. I think a small amount of narcotics will be okay. Her previous SI attempts were with regular meds, not narcotics or benzo's.

## 2017-12-07 NOTE — Telephone Encounter (Signed)
Copied from [place] [id number]. Topic: Quick Communication - See Telephone Encounter >> Dec 07, 2017 11:12 AM Arletha Grippe wrote: CRM for notification. See Telephone encounter for: 12/07/17.  Pt is scheduled for surgery tomorrow, Dr Richardson Landry normally prescribes narcotic pain relievers for this type of surgery.  This pt has a history of overdose and the doctor want to make sure that it is aware of this and to get R. Baity's opinion on this.  Cb is [phone], ask for [facility], surgery coordinator.

## 2017-12-07 NOTE — Telephone Encounter (Signed)
Form completed, given to MYD

## 2017-12-07 NOTE — Discharge Instructions (Signed)
[facility] °[facility] °ENDOSCOPIC SINUS SURGERY °[facility] ° °What is Functional Endoscopic Sinus Surgery? ° The Surgery involves making the natural openings of the sinuses larger by removing the bony partitions that separate the sinuses from the nasal cavity.  The natural sinus lining is preserved as much as possible to allow the sinuses to resume normal function after the surgery.  In some patients nasal polyps (excessively swollen lining of the sinuses) may be removed to relieve obstruction of the sinus openings.  The surgery is performed through the nose using lighted scopes, which eliminates the need for incisions on the face.  A septoplasty is a different procedure which is sometimes performed with sinus surgery.  It involves straightening the boy partition that separates the two sides of your nose.  A crooked or deviated septum may need repair if is obstructing the sinuses or nasal airflow.  Turbinate reduction is also often performed during sinus surgery.  The turbinates are bony proturberances from the side walls of the nose which swell and can obstruct the nose in patients with sinus and allergy problems.  Their size can be surgically reduced to help relieve nasal obstruction. ° °What Can Sinus Surgery Do For Me? ° Sinus surgery can reduce the frequency of sinus infections requiring antibiotic treatment.  This can provide improvement in nasal congestion, post-nasal drainage, facial pressure and nasal obstruction.  Surgery will NOT prevent you from ever having an infection again, so it usually only for patients who get infections 4 or more times yearly requiring antibiotics, or for infections that do not clear with antibiotics.  It will not cure nasal allergies, so patients with allergies may still require medication to treat their allergies after surgery. Surgery may improve headaches related to sinusitis, however, some people will continue to  require medication to control sinus headaches related to allergies.  Surgery will do nothing for other forms of headache (migraine, tension or cluster). ° °What Are the Risks of Endoscopic Sinus Surgery? ° Current techniques allow surgery to be performed safely with little risk, however, there are rare complications that patients should be aware of.  Because the sinuses are located around the eyes, there is risk of eye injury, including blindness, though again, this would be quite rare. This is usually a result of bleeding behind the eye during surgery, which puts the vision oat risk, though there are treatments to protect the vision and prevent permanent disrupted by surgery causing a leak of the spinal fluid that surrounds the brain.  More serious complications would include bleeding inside the brain cavity or damage to the brain.  Again, all of these complications are uncommon, and spinal fluid leaks can be safely managed surgically if they occur.  The most common complication of sinus surgery is bleeding from the nose, which may require packing or cauterization of the nose.  Continued sinus have polyps may experience recurrence of the polyps requiring revision surgery.  Alterations of sense of smell or injury to the tear ducts are also rare complications.  ° °What is the Surgery Like, and what is the Recovery? ° The Surgery usually takes a couple of hours to perform, and is usually performed under a general anesthetic (completely asleep).  Patients are usually discharged home after a couple of hours.  Sometimes during surgery it is necessary to pack the nose to control bleeding, and the packing is left in place for 24 - 48 hours, and removed by your surgeon.    If a septoplasty was performed during the procedure, there is often a splint placed which must be removed after 5-7 days.   °Discomfort: Pain is usually mild to moderate, and can be controlled by prescription pain medication or acetaminophen (Tylenol).   Aspirin, Ibuprofen (Advil, Motrin), or Naprosyn (Aleve) should be avoided, as they can cause increased bleeding.  Most patients feel sinus pressure like they have a bad head cold for several days.  Sleeping with your head elevated can help reduce swelling and facial pressure, as can ice packs over the face.  A humidifier may be helpful to keep the mucous and blood from drying in the nose.  ° °Diet: There are no specific diet restrictions, however, you should generally start with clear liquids and a light diet of bland foods because the anesthetic can cause some nausea.  Advance your diet depending on how your stomach feels.  Taking your pain medication with food will often help reduce stomach upset which pain medications can cause. ° °Nasal Saline Irrigation: It is important to remove blood clots and dried mucous from the nose as it is healing.  This is done by having you irrigate the nose at least 3 - 4 times daily with a salt water solution.  We recommend using NeilMed Sinus Rinse (available at the drug store).  Fill the squeeze bottle with the solution, bend over a sink, and insert the tip of the squeeze bottle into the nose ½ of an inch.  Point the tip of the squeeze bottle towards the inside corner of the eye on the same side your irrigating.  Squeeze the bottle and gently irrigate the nose.  If you bend forward as you do this, most of the fluid will flow back out of the nose, instead of down your throat.   The solution should be warm, near body temperature, when you irrigate.   Each time you irrigate, you should use a full squeeze bottle.  ° °Note that if you are instructed to use Nasal Steroid Sprays at any time after your surgery, irrigate with saline BEFORE using the steroid spray, so you do not wash it all out of the nose. °Another product, Nasal Saline Gel (such as AYR Nasal Saline Gel) can be applied in each nostril 3 - 4 times daily to moisture the nose and reduce scabbing or crusting. ° °Bleeding:   Bloody drainage from the nose can be expected for several days, and patients are instructed to irrigate their nose frequently with salt water to help remove mucous and blood clots.  The drainage may be dark red or brown, though some fresh blood may be seen intermittently, especially after irrigation.  Do not blow you nose, as bleeding may occur. If you must sneeze, keep your mouth open to allow air to escape through your mouth. ° °If heavy bleeding occurs: Irrigate the nose with saline to rinse out clots, then spray the nose 3 - 4 times with Afrin Nasal Decongestant Spray.  The spray will constrict the blood vessels to slow bleeding.  Pinch the lower half of your nose shut to apply pressure, and lay down with your head elevated.  Ice packs over the nose may help as well. If bleeding persists despite these measures, you should notify your doctor.  Do not use the Afrin routinely to control nasal congestion after surgery, as it can result in worsening congestion and may affect healing.  ° ° ° °Activity: Return to work varies among patients. Most patients will be   out of work at least 5 - 7 days to recover.  Patient may return to work after they are off of narcotic pain medication, and feeling well enough to perform the functions of their job.  Patients must avoid heavy lifting (over 10 pounds) or strenuous physical for 2 weeks after surgery, so your employer may need to assign you to light duty, or keep you out of work longer if light duty is not possible.  NOTE: you should not drive, operate dangerous machinery, do any mentally demanding tasks or make any important legal or financial decisions while on narcotic pain medication and recovering from the general anesthetic.  °  °Call Your Doctor Immediately if You Have Any of the Following: °1. Bleeding that you cannot control with the above measures °2. Loss of vision, double vision, bulging of the eye or black eyes. °3. Fever over 101 degrees °4. Neck stiffness with  severe headache, fever, nausea and change in mental state. °You are always encourage to call anytime with concerns, however, please call with requests for pain medication refills during office hours. ° °Office Endoscopy: During follow-up visits your doctor will remove any packing or splints that may have been placed and evaluate and clean your sinuses endoscopically.  Topical anesthetic will be used to make this as comfortable as possible, though you may want to take your pain medication prior to the visit.  How often this will need to be done varies from patient to patient.  After complete recovery from the surgery, you may need follow-up endoscopy from time to time, particularly if there is concern of recurrent infection or nasal polyps. ° ° °General Anesthesia, Adult, Care After °These instructions provide you with information about caring for yourself after your procedure. Your health care provider may also give you more specific instructions. Your treatment has been planned according to current medical practices, but problems sometimes occur. Call your health care provider if you have any problems or questions after your procedure. °What can I expect after the procedure? °After the procedure, it is common to have: °· Vomiting. °· A sore throat. °· Mental slowness. ° °It is common to feel: °· Nauseous. °· Cold or shivery. °· Sleepy. °· Tired. °· Sore or achy, even in parts of your body where you did not have surgery. ° °Follow these instructions at home: °For at least 24 hours after the procedure: °· Do not: °? Participate in activities where you could fall or become injured. °? Drive. °? Use heavy machinery. °? Drink alcohol. °? Take sleeping pills or medicines that cause drowsiness. °? Make important decisions or sign legal documents. °? Take care of children on your own. °· Rest. °Eating and drinking °· If you vomit, drink water, juice, or soup when you can drink without vomiting. °· Drink enough fluid to  keep your urine clear or pale yellow. °· Make sure you have little or no nausea before eating solid foods. °· Follow the diet recommended by your health care provider. °General instructions °· Have a responsible adult stay with you until you are awake and alert. °· Return to your normal activities as told by your health care provider. Ask your health care provider what activities are safe for you. °· Take over-the-counter and prescription medicines only as told by your health care provider. °· If you smoke, do not smoke without supervision. °· Keep all follow-up visits as told by your health care provider. This is important. °Contact a health care provider if: °· You   continue to have nausea or vomiting at home, and medicines are not helpful. °· You cannot drink fluids or start eating again. °· You cannot urinate after 8-12 hours. °· You develop a skin rash. °· You have fever. °· You have increasing redness at the site of your procedure. °Get help right away if: °· You have difficulty breathing. °· You have chest pain. °· You have unexpected bleeding. °· You feel that you are having a life-threatening or urgent problem. °This information is not intended to replace advice given to you by your health care provider. Make sure you discuss any questions you have with your health care provider. °Document Released: 10/27/2000 Document Revised: 12/24/2015 Document Reviewed: 07/05/2015 °Elsevier Interactive Patient Education © 2018 [employer] ° °

## 2017-12-07 NOTE — Telephone Encounter (Signed)
Called [facility] and lmovm for Becky to return my call.. I have not seen a medical clearance form and requested that it be faxed to [phone]

## 2017-12-07 NOTE — Telephone Encounter (Signed)
I have not seen it either

## 2017-12-07 NOTE — Telephone Encounter (Signed)
Caller name: Jacqlyn Larsen Relation to pt: from [facility] Call back number: Phone [phone]  Fax # [phone]    Reason for call:  Re fax form to the mentioned # below, confirmation received.

## 2017-12-07 NOTE — Telephone Encounter (Signed)
Form faxed

## 2017-12-07 NOTE — Telephone Encounter (Signed)
Pt is calling and states she is scheduled for surgery tomorrow with Dr. Whalen Trompeter Landry but they have not received medical clearance from [facility] and without it her surgery will be cancelled. Pt would like to see if this can be sent over ASAP

## 2017-12-08 ENCOUNTER — Ambulatory Visit
Admission: RE | Admit: 2017-12-08 | Discharge: 2017-12-08 | Disposition: A | Discharge status: Home / Self Care | Payer: BLUE CROSS/BLUE SHIELD | Source: Ambulatory Visit | Attending: Otolaryngology | Admitting: Otolaryngology

## 2017-12-08 ENCOUNTER — Encounter
Admission: RE | Disposition: A | Discharge status: Home / Self Care | Payer: Self-pay | Source: Ambulatory Visit | Attending: Otolaryngology

## 2017-12-08 ENCOUNTER — Ambulatory Visit: Payer: BLUE CROSS/BLUE SHIELD | Admitting: Anesthesiology

## 2017-12-08 DIAGNOSIS — J3489 Other specified disorders of nose and nasal sinuses: Secondary | ICD-10-CM | POA: Insufficient documentation

## 2017-12-08 DIAGNOSIS — H903 Sensorineural hearing loss, bilateral: Secondary | ICD-10-CM | POA: Insufficient documentation

## 2017-12-08 DIAGNOSIS — Z87891 Personal history of nicotine dependence: Secondary | ICD-10-CM | POA: Insufficient documentation

## 2017-12-08 DIAGNOSIS — J301 Allergic rhinitis due to pollen: Secondary | ICD-10-CM | POA: Insufficient documentation

## 2017-12-08 DIAGNOSIS — J343 Hypertrophy of nasal turbinates: Secondary | ICD-10-CM | POA: Diagnosis not present

## 2017-12-08 HISTORY — DX: Cardiac arrhythmia, unspecified: I49.9

## 2017-12-08 HISTORY — DX: Myoneural disorder, unspecified: G70.9

## 2017-12-08 HISTORY — DX: Acute embolism and thrombosis of unspecified deep veins of unspecified lower extremity: I82.409

## 2017-12-08 SURGERY — NASAL VALVE REPAIR WITH NASAL IMPLANT
Anesthesia: General | Laterality: Bilateral | Wound class: Clean Contaminated

## 2017-12-08 MED ORDER — LACTATED RINGERS IV SOLN
INTRAVENOUS | Status: DC | PRN
  Administered 2017-12-08 (×2): via INTRAVENOUS

## 2017-12-08 MED ORDER — CEPHALEXIN 500 MG PO CAPS: 500.0000 mg | ORAL_CAPSULE | Freq: Two times a day (BID) | ORAL | 0 refills | Status: DC

## 2017-12-08 MED ORDER — HYDROCODONE-ACETAMINOPHEN 5-325 MG PO TABS: 1.0000 | ORAL_TABLET | Freq: Four times a day (QID) | ORAL | 0 refills | Status: DC | PRN

## 2017-12-08 MED ORDER — PROPOFOL 10 MG/ML IV BOLUS
INTRAVENOUS | Status: DC | PRN
  Administered 2017-12-08: 200 mg via INTRAVENOUS

## 2017-12-08 MED ORDER — DEXMEDETOMIDINE HCL 200 MCG/2ML IV SOLN
INTRAVENOUS | Status: DC | PRN
  Administered 2017-12-08: 4 ug via INTRAVENOUS

## 2017-12-08 MED ORDER — LIDOCAINE-EPINEPHRINE 1 %-1:100000 IJ SOLN
INTRAMUSCULAR | Status: DC | PRN
  Administered 2017-12-08: 10 mL

## 2017-12-08 MED ORDER — OXYCODONE HCL 5 MG PO TABS
5.0000 mg | ORAL_TABLET | Freq: Once | ORAL | Status: AC | PRN
  Administered 2017-12-08: 5 mg via ORAL

## 2017-12-08 MED ORDER — SUCCINYLCHOLINE CHLORIDE 20 MG/ML IJ SOLN
INTRAMUSCULAR | Status: DC | PRN
  Administered 2017-12-08: 100 mg via INTRAVENOUS

## 2017-12-08 MED ORDER — LACTATED RINGERS IV SOLN: INTRAVENOUS | Status: DC

## 2017-12-08 MED ORDER — ONDANSETRON HCL 4 MG/2ML IJ SOLN
INTRAMUSCULAR | Status: DC | PRN
  Administered 2017-12-08: 4 mg via INTRAVENOUS

## 2017-12-08 MED ORDER — MIDAZOLAM HCL 5 MG/5ML IJ SOLN
INTRAMUSCULAR | Status: DC | PRN
  Administered 2017-12-08: 2 mg via INTRAVENOUS

## 2017-12-08 MED ORDER — OXYMETAZOLINE HCL 0.05 % NA SOLN
NASAL | Status: DC | PRN
  Administered 2017-12-08: 4 via TOPICAL

## 2017-12-08 MED ORDER — LIDOCAINE HCL (CARDIAC) PF 100 MG/5ML IV SOSY
PREFILLED_SYRINGE | INTRAVENOUS | Status: DC | PRN
  Administered 2017-12-08: 20 mg via INTRAVENOUS

## 2017-12-08 MED ORDER — GLYCOPYRROLATE 0.2 MG/ML IJ SOLN
INTRAMUSCULAR | Status: DC | PRN
  Administered 2017-12-08: 0.1 mg via INTRAVENOUS

## 2017-12-08 MED ORDER — FENTANYL CITRATE (PF) 100 MCG/2ML IJ SOLN
INTRAMUSCULAR | Status: DC | PRN
  Administered 2017-12-08: 100 ug via INTRAVENOUS

## 2017-12-08 MED ORDER — DEXAMETHASONE SODIUM PHOSPHATE 4 MG/ML IJ SOLN
INTRAMUSCULAR | Status: DC | PRN
  Administered 2017-12-08: 4 mg via INTRAVENOUS

## 2017-12-08 MED ORDER — FENTANYL CITRATE (PF) 100 MCG/2ML IJ SOLN: 25.0000 ug | INTRAMUSCULAR | Status: DC | PRN

## 2017-12-08 MED ORDER — OXYCODONE HCL 5 MG/5ML PO SOLN: 5.0000 mg | Freq: Once | ORAL | Status: AC | PRN

## 2017-12-08 SURGICAL SUPPLY — 19 items
CANISTER SUCT 1200ML W/VALVE (MISCELLANEOUS) ×2 IMPLANT
COAG SUCT 10F 3.5MM HAND CTRL (MISCELLANEOUS) ×2 IMPLANT
DRAPE HEAD BAR (DRAPES) ×2 IMPLANT
DRESSING NASL FOAM PST OP SINU (MISCELLANEOUS) IMPLANT
DRSG NASAL FOAM POST OP SINU (MISCELLANEOUS)
ELECT REM PT RETURN 9FT ADLT (ELECTROSURGICAL) ×2
ELECTRODE REM PT RTRN 9FT ADLT (ELECTROSURGICAL) ×1 IMPLANT
GLOVE EXAM LX STRL 7.5 (GLOVE) ×4 IMPLANT
KIT TURNOVER KIT A (KITS) ×2 IMPLANT
NEEDLE HYPO 25GX1X1/2 BEV (NEEDLE) ×2 IMPLANT
NS IRRIG 500ML POUR BTL (IV SOLUTION) ×2 IMPLANT
PACK DRAPE NASAL/ENT (PACKS) ×2 IMPLANT
PATTIES SURGICAL .5 X3 (DISPOSABLE) ×2 IMPLANT
SUT CHROMIC 4 0 RB 1X27 (SUTURE) ×2 IMPLANT
SUT PLAIN GUT 4-0 (SUTURE) ×2 IMPLANT
SYRINGE 10CC LL (SYRINGE) ×2 IMPLANT
TOWEL OR 17X26 4PK STRL BLUE (TOWEL DISPOSABLE) ×2 IMPLANT
WAND COBLATOR ENT REFLEX ULT45 (MISCELLANEOUS) ×2 IMPLANT
WATER STERILE IRR 250ML POUR (IV SOLUTION) ×2 IMPLANT

## 2017-12-08 NOTE — H&P (Signed)
History and physical reviewed and will be scanned in later. No change in medical status reported by the patient or family, appears stable for surgery. All questions regarding the procedure answered, and patient (or family if a child) expressed understanding of the procedure. ? ?Sonia Smith S Sonia Smith ?@TODAY@ ?

## 2017-12-08 NOTE — Anesthesia Postprocedure Evaluation (Signed)
Anesthesia Post Note  Patient: Sonia Smith  Procedure(s) Performed: SPREADER VALVE REPAIR OF RIGHT NASAL VALVE STENOSIS COBLATION OF TURBINATES (Bilateral )  Patient location during evaluation: PACU Level of consciousness: awake and alert Pain management: pain level controlled Vital Signs Assessment: post-procedure vital signs reviewed and stable Respiratory status: spontaneous breathing Cardiovascular status: blood pressure returned to baseline Postop Assessment: no headache Anesthetic complications: no    Jaci Standard, III,  [place] Millon D

## 2017-12-08 NOTE — Op Note (Signed)
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 Hypertrophy of the inferior turbinates  Procedure: The patient was taken to the Operating Room and placed in the supine position.  After induction of general endotracheal anesthesia, the table was turned 90 degrees. A time-out was issued to confirm the site and procedure. The nasal septum and inferior turbinates where then injected with 1% lidocaine with epiniephrine, 1:100,000. The nose was decongested with Afrin soaked pledgets. The patient was then prepped and draped in the usual sterile fashion. Beginning on the right hand side a hemitransfixion incision was then created on the leading edge of the septum on the right.  A subperichondrial plane was elevated posteriorly on the right, exposing the cartilage. A cartilage graft was harvested from the midseptum, taking care to avoid injury the the mucoperichondirum on the left side of the septum. A precise pocket was created along the right side of the dorsal septum between the dorsal septum and the upper lateral cartilage. The cartilage graft was shaped into a spreader graft, about 71mm x 74mm, and carefully placed in this pocket, lateralizing the upper lateral cartilage. The graft was then further secured into place with through and through 4-0 plain gut suture, which was also used to reapproximate the elevated pericondrium to the underlying cartilage to prevent hematoma formation. The septal incision was closed with 4-0 chromic gut suture.   Next the coblator was passed into the inferior turbinate on the  left and, using a setting of 4 watts, submucous ablation of the soft tissue of the turbinate was perfomed, ablating for approximately 10 seconds. A second site more inferiorly was similarly ablated. The same procedure was then perfomed on the right. Each turbinate was also slightly out-fractured to further provide airway improvement.  The nose was suctioned to remove any blood clot. With no active bleeding ding noted, the patient was then returned to the anesthesiologist for awakening.  After completion of the procedure she was turned 90 degrees and all drapes removed for awakening. At some point prior to awakening, it was noted that her small white plastic spacer for a left nasal ala piercing was missing. This had been noted to be present during the case. The room and waste from the procedure was all carefully inspected and it could not be located. I examined and suctioned the nose and throat with a headlight, finding nothing. A zero degree scope was use to further examine the nasal cavity bilaterally and nothing was found in the nasal cavity or nasopharynx. I also performed a direct laryngoscopy, suctioning some blood from the throat and examining the oropharynx and hypopharynx, but no foreign body was seen. After this careful exam, she was deemed safe for extubation, but ultimatly this small plastic spacer could not be located. It is suspected it may have fallen out into the drapes, but could not be found due to its size. Since it was not radioopaque there was felt to be no benefit to X-rays.  The patient was awakened and was taken to the Recovery Room in stable condition.  Disposition:   PACU to home  Plan:  Take pain medications and antibiotics as prescribed. No strenuous activity for 2 weeks. Irrigate with saline 2-3 times daily. Avoid excessive nose blowing. Follow-up in 2 weeks.  Riley Nearing 12/08/2017 11:15 AM

## 2017-12-08 NOTE — Anesthesia Procedure Notes (Addendum)
Procedure Name: Intubation Date/Time: 12/08/2017 10:13 AM Performed by: Lind Guest, CRNA Pre-anesthesia Checklist: Patient identified, Patient being monitored, Timeout performed, Emergency Drugs available and Suction available Patient Re-evaluated:Patient Re-evaluated prior to induction Oxygen Delivery Method: Circle system utilized Preoxygenation: Pre-oxygenation with 100% oxygen Induction Type: IV induction Ventilation: Mask ventilation without difficulty Laryngoscope Size: Mac and 3 Grade View: Grade I Tube type: Oral Rae Tube size: 7.0 mm Number of attempts: 1 Placement Confirmation: ETT inserted through vocal cords under direct vision,  positive ETCO2 and breath sounds checked- equal and bilateral Secured at: 21 cm Tube secured with: Tape Dental Injury: Teeth and Oropharynx as per pre-operative assessment

## 2017-12-08 NOTE — Anesthesia Preprocedure Evaluation (Signed)
Anesthesia Evaluation  Patient identified by MRN, date of birth, ID band Patient awake    Reviewed: Allergy & Precautions, H&P , NPO status , Patient's Chart, lab work & pertinent test results  History of Anesthesia Complications Negative for: history of anesthetic complications  Airway Mallampati: II  TM Distance: >3 FB Neck ROM: full    Dental no notable dental hx.    Pulmonary former smoker,    Pulmonary exam normal        Cardiovascular Normal cardiovascular exam+ dysrhythmias      Neuro/Psych    GI/Hepatic Neg liver ROS, Medicated,  Endo/Other  negative endocrine ROS  Renal/GU negative Renal ROS     Musculoskeletal   Abdominal   Peds  Hematology negative hematology ROS (+)   Anesthesia Other Findings   Reproductive/Obstetrics                             Anesthesia Physical Anesthesia Plan  ASA: II  Anesthesia Plan:    Post-op Pain Management:    Induction:   PONV Risk Score and Plan:   Airway Management Planned:   Additional Equipment:   Intra-op Plan:   Post-operative Plan:   Informed Consent: I have reviewed the patients History and Physical, chart, labs and discussed the procedure including the risks, benefits and alternatives for the proposed anesthesia with the patient or authorized representative who has indicated his/her understanding and acceptance.     Plan Discussed with:   Anesthesia Plan Comments:         Anesthesia Quick Evaluation

## 2017-12-08 NOTE — Transfer of Care (Signed)
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  Level of Consciousness: awake, alert  and patient cooperative  Airway and Oxygen Therapy: Patient Spontanous Breathing and Patient connected to supplemental oxygen  Post-op Assessment: Post-op Vital signs reviewed, Patient's Cardiovascular Status Stable, Respiratory Function Stable, Patent Airway and No signs of Nausea or vomiting  Post-op Vital Signs: Reviewed and stable  Complications: No apparent anesthesia complications

## 2017-12-09 ENCOUNTER — Encounter: Payer: Self-pay | Admitting: Internal Medicine

## 2017-12-16 ENCOUNTER — Ambulatory Visit: Payer: BLUE CROSS/BLUE SHIELD | Admitting: Family Medicine

## 2017-12-22 ENCOUNTER — Encounter: Payer: Self-pay | Admitting: *Deleted

## 2017-12-22 ENCOUNTER — Other Ambulatory Visit: Payer: Self-pay | Admitting: *Deleted

## 2017-12-22 DIAGNOSIS — I82622 Acute embolism and thrombosis of deep veins of left upper extremity: Secondary | ICD-10-CM | POA: Insufficient documentation

## 2017-12-23 ENCOUNTER — Encounter: Payer: Self-pay | Admitting: Family Medicine

## 2017-12-23 ENCOUNTER — Ambulatory Visit: Payer: BLUE CROSS/BLUE SHIELD | Admitting: Family Medicine

## 2017-12-23 VITALS — BP 100/68 | HR 95 | Temp 98.3°F | Ht 67.0 in | Wt 149.8 lb

## 2017-12-23 DIAGNOSIS — M5412 Radiculopathy, cervical region: Secondary | ICD-10-CM | POA: Diagnosis not present

## 2017-12-23 MED ORDER — PREDNISONE 20 MG PO TABS: ORAL_TABLET | ORAL | 0 refills | Status: DC

## 2017-12-23 NOTE — Progress Notes (Signed)
Dr. Frederico Hamman T. Alianny Toelle, MD, [facility] [place], [address] Phone: [phone] Fax: [phone]  12/23/2017  Patient: Sonia Smith, MRN: [id number], DOB: August 29, 1980, 37 y.o.  Primary Physician:  Jearld Fenton, NP   Chief Complaint  Patient presents with  . Shoulder Pain    Left-radiates down arm   Subjective:   Sonia Smith is a 37 y.o. very pleasant female patient who presents with the following:  Very nice young lady who presents with some left-sided shoulder blade pain and radicular pain down her left arm with also some numbness on the upper portion of her arm, meeting, the radial aspect of the forearm and also this general region in the upper arm as well.  This carries down to fingers 1-3.  This is worsened when she extends her neck.  She has not had any kind of trauma, no prior significant neck injury in the past.  It does not really seem to be worse when she moves her shoulder.  L arm - 1-3 area, whole arm. Only started a week and a half.  Has been moving some. No moving since then.   + RA per before at age 37.     Past Medical History, Surgical History, Social History, Family History, Problem List, Medications, and Allergies have been reviewed and updated if relevant.  Patient Active Problem List   Diagnosis Date Noted  . Acute deep vein thrombosis (DVT) of brachial vein of left upper extremity ([place]) 12/22/2017  . Severe episode of recurrent major depressive disorder, without psychotic features ([place]) 08/12/2017  . Hemorrhoids 06/11/2016  . Chronic constipation 06/11/2016  . Adjustment disorder with depressed mood   . Childhood asthma 09/04/2014  . Migraines 09/04/2014  . GERD (gastroesophageal reflux disease) 09/04/2014  . Chronic pelvic pain in female 09/04/2014  . Rheumatoid arthritis involving multiple sites [facility]) 05/02/2014    Past Medical History:  Diagnosis Date  . Blood in stool     . 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 Depression    in high school  . DVT (deep venous thrombosis) ([place])    in left arm, finishing eliquis on tomorrow 12/01/2017  . Dysrhythmia   . GERD (gastroesophageal reflux disease)   . Lupus ([place])    tested positive many years ago, most recent came back negative.  . Migraines   . Neuromuscular disorder ([place])    right pinkey and ring finger  . Painful intercourse   . Pudendal neuralgia   . Vaginal Pap smear, abnormal     Past Surgical History:  Procedure Laterality Date  . ABDOMINAL HYSTERECTOMY  2008  . 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 CERVICAL CERCLAGE  2001, 2004  . HEMORRHOID SURGERY N/A 11/06/2015   Procedure: HEMORRHOIDECTOMY;  Surgeon: Leonie Green, MD;  Location: [facility] ORS;  Service: General;  Laterality: N/A;  . LEEP  1998   dysplasia    Social History   Socioeconomic History  . Marital status: Married    Spouse name: Not on file  . Number of children: Not on file  . Years of education: Not on file  . Highest education level: Not on file  Occupational History  . Not on file  Social Needs  . Financial resource strain: Not on file  . Food insecurity:    Worry: Not on file    Inability: Not  on file  . Transportation needs:    Medical: Not on file    Non-medical: Not on file  Tobacco Use  . Smoking status: Former Smoker    Last attempt to quit: 10/08/1999    Years since quitting: 18.2  . Smokeless tobacco: Never Used  . Tobacco comment: quit 2001  Substance and Sexual Activity  . Alcohol use: No    Alcohol/week: 0.0 oz  . Drug use: No  . Sexual activity: Yes    Birth control/protection: Surgical  Lifestyle  . Physical activity:    Days per week: Not on file    Minutes per session: Not on file  . Stress: Not on file  Relationships  . Social connections:    Talks on phone: Not on file    Gets together: Not on file    Attends religious service: Not on file    Active  member of club or organization: Not on file    Attends meetings of clubs or organizations: Not on file    Relationship status: Not on file  . Intimate partner violence:    Fear of current or ex partner: Not on file    Emotionally abused: Not on file    Physically abused: Not on file    Forced sexual activity: Not on file  Other Topics Concern  . Not on file  Social History Narrative  . Not on file    Family History  Problem Relation Age of Onset  . Diabetes Mother   . Hypertension Mother   . Cancer Father        lung  . Diabetes Father   . Hypertension Father   . Heart disease Maternal Grandmother   . Heart disease Maternal Grandfather   . Cancer Maternal Grandfather   . Cancer Paternal Grandfather   . Cancer Other   . Cancer Other   . Stroke Neg Hx     Allergies  Allergen Reactions  . Polycarbophil Other (See Comments)    Redness, skin peeling  . Adhesive [Tape] Rash    From bandaides. Tape is OK.    Medication list reviewed and updated in full in [place].  GEN: No fevers, chills. Nontoxic. Primarily MSK c/o today. MSK: Detailed in the HPI GI: tolerating PO intake without difficulty Neuro: No numbness, parasthesias, or tingling associated. Otherwise the pertinent positives of the ROS are noted above.   Objective:   BP 100/68   Pulse 95   Temp 98.3 F (36.8 C) (Oral)   Ht 5\' 7"  (1.702 m)   Wt 149 lb 12 oz (67.9 kg)   BMI 23.45 kg/m    GEN: Well-developed,well-nourished,in no acute distress; alert,appropriate and cooperative throughout examination HEENT: Normocephalic and atraumatic without obvious abnormalities. Ears, externally no deformities PULM: Breathing comfortably in no respiratory distress EXT: No clubbing, cyanosis, or edema PSYCH: Normally interactive. Cooperative during the interview. Pleasant. Friendly and conversant. Not anxious or depressed appearing. Normal, full affect.  CERVICAL SPINE EXAM Range of motion: Flexion, extension,  lateral bending, and rotation: modest restriction in ext and + self-spurlings maneuver to the L side Pain with terminal motion: mild Spinous Processes: NT SCM: NT Upper paracervical muscles: mild ttp Upper traps: NT C5-T1 intact, sensation and motor   Shoulder: B Inspection: No muscle wasting or winging Ecchymosis/edema: neg  AC joint, scapula, clavicle: NT Abduction: full, 5/5 Flexion: full, 5/5 IR, full, lift-off: 5/5 ER at neutral: full, 5/5 AC crossover and compression: neg Neer: neg Hawkins: neg Drop  Test: neg Empty Can: neg Supraspinatus insertion: NT Bicipital groove: NT Speed's: neg Yergason's: neg Sulcus sign: neg Scapular dyskinesis: none C5-T1 intact Sensation intact Grip 5/5   Radiology: No results found.  Assessment and Plan:   Left cervical radiculopathy  Inducible L cervical radiculopathy.   Start with steroids and mckenzie protcol. If no improvement in 2 weeks, formal PT, extend steroids and f/u.  She is on high dose neurontin.  Follow-up: No follow-ups on file.  Meds ordered this encounter  Medications  . predniSONE (DELTASONE) 20 MG tablet    Sig: 2 tabs po for 5 days, then 1 tab po for 5 days    Dispense:  15 tablet    Refill:  0   Signed,  Marise Knapper T. Keiley Levey, MD   Allergies as of 12/23/2017      Reactions   Polycarbophil Other (See Comments)   Redness, skin peeling   Adhesive [tape] Rash   From bandaides. Tape is OK.      Medication List        Accurate as of 12/23/17  1:56 PM. Always use your most recent med list.          acetaminophen 500 MG tablet Commonly known as:  TYLENOL Take 500 mg by mouth every 6 (six) hours as needed.   cyclobenzaprine 10 MG tablet Commonly known as:  FLEXERIL Take 10 mg by mouth 3 (three) times daily.   docusate sodium 250 MG capsule Commonly known as:  COLACE Take by mouth 3 (three) times daily.   gabapentin 800 MG tablet Commonly known as:  NEURONTIN Take 1,200 mg by mouth 3  (three) times daily.   HYDROcodone-acetaminophen 5-325 MG tablet Commonly known as:  NORCO/VICODIN Take 1 tablet by mouth every 6 (six) hours as needed for moderate pain.   imipramine 50 MG tablet Commonly known as:  TOFRANIL Take 50 mg by mouth at bedtime.   lidocaine 5 % ointment Commonly known as:  XYLOCAINE APPLY TOPICALLY PRN   predniSONE 20 MG tablet Commonly known as:  DELTASONE 2 tabs po for 5 days, then 1 tab po for 5 days   spironolactone 50 MG tablet Commonly known as:  ALDACTONE Take 50-100 mg by mouth.   tretinoin microspheres 0.04 % gel Commonly known as:  RETIN-A MICRO Apply topically.

## 2017-12-23 NOTE — Patient Instructions (Signed)
McKenzie protocol cervical spine

## 2017-12-29 DIAGNOSIS — L811 Chloasma: Secondary | ICD-10-CM | POA: Diagnosis not present

## 2017-12-29 DIAGNOSIS — L819 Disorder of pigmentation, unspecified: Secondary | ICD-10-CM | POA: Diagnosis not present

## 2017-12-29 DIAGNOSIS — L700 Acne vulgaris: Secondary | ICD-10-CM | POA: Diagnosis not present

## 2018-01-28 DIAGNOSIS — R102 Pelvic and perineal pain: Secondary | ICD-10-CM | POA: Diagnosis not present

## 2018-01-28 DIAGNOSIS — N3010 Interstitial cystitis (chronic) without hematuria: Secondary | ICD-10-CM | POA: Diagnosis not present

## 2018-01-28 DIAGNOSIS — N94819 Vulvodynia, unspecified: Secondary | ICD-10-CM | POA: Diagnosis not present

## 2018-01-28 DIAGNOSIS — N9489 Other specified conditions associated with female genital organs and menstrual cycle: Secondary | ICD-10-CM | POA: Diagnosis not present

## 2018-01-31 ENCOUNTER — Encounter: Payer: Self-pay | Admitting: Internal Medicine

## 2018-02-11 ENCOUNTER — Other Ambulatory Visit: Payer: Self-pay | Admitting: Obstetrics and Gynecology

## 2018-02-22 ENCOUNTER — Encounter: Payer: Self-pay | Admitting: Internal Medicine

## 2018-02-22 ENCOUNTER — Ambulatory Visit: Payer: BLUE CROSS/BLUE SHIELD | Admitting: Internal Medicine

## 2018-02-22 VITALS — BP 108/70 | HR 94 | Temp 98.4°F | Resp 16 | Ht 67.0 in | Wt 154.8 lb

## 2018-02-22 DIAGNOSIS — R102 Pelvic and perineal pain: Secondary | ICD-10-CM

## 2018-02-22 DIAGNOSIS — K5909 Other constipation: Secondary | ICD-10-CM

## 2018-02-22 DIAGNOSIS — F4321 Adjustment disorder with depressed mood: Secondary | ICD-10-CM

## 2018-02-22 DIAGNOSIS — Z0000 Encounter for general adult medical examination without abnormal findings: Secondary | ICD-10-CM | POA: Diagnosis not present

## 2018-02-22 DIAGNOSIS — K921 Melena: Secondary | ICD-10-CM

## 2018-02-22 DIAGNOSIS — E559 Vitamin D deficiency, unspecified: Secondary | ICD-10-CM | POA: Diagnosis not present

## 2018-02-22 DIAGNOSIS — K219 Gastro-esophageal reflux disease without esophagitis: Secondary | ICD-10-CM | POA: Diagnosis not present

## 2018-02-22 DIAGNOSIS — G43919 Migraine, unspecified, intractable, without status migrainosus: Secondary | ICD-10-CM | POA: Diagnosis not present

## 2018-02-22 DIAGNOSIS — G8929 Other chronic pain: Secondary | ICD-10-CM

## 2018-02-22 DIAGNOSIS — R002 Palpitations: Secondary | ICD-10-CM

## 2018-02-22 NOTE — Assessment & Plan Note (Signed)
Having soft stool with Colace Encouraged high fiber diet, adequate fluid intake

## 2018-02-22 NOTE — Assessment & Plan Note (Signed)
Discussed avoiding foods that trigger reflux Continue Omeprazole as needed CBC and CMET today

## 2018-02-22 NOTE — Assessment & Plan Note (Signed)
Taking Clonazepam prescribed by her urologist Discussed addictive nature of this medication and how she she use is sparingly

## 2018-02-22 NOTE — Assessment & Plan Note (Signed)
Currently not an issue Will monitor 

## 2018-02-22 NOTE — Assessment & Plan Note (Signed)
Stable on Imipramine and Gabapentin She will continue to follow with urology

## 2018-02-22 NOTE — Progress Notes (Signed)
Subjective:    Patient ID: Sonia Smith, female    DOB: 1981/02/13, 37 y.o.   MRN: [id number]  HPI  Pt presents to the clinic today for her annual exam. She is also due to follow up chronic conditions.  Adjustment Disorder with Depression: She is taking Clonazepam as prescribed. This is prescribed by Dr. Amalia Hailey.  GERD: She reports this is triggered by her prescription meds. She takes Prilosec OTC as needed with good relief.  Migraines: She reports she has not really headaches recently. She does not take anything for this on a regular base.  Chronic Constipation: She has a BM 1 x week. This is not new for her. She takes Colace daily.  Chronic Pelvic/Urethral Pain: She is taking Gabapentin and Imipramine as prescribed. She follows with Dr. Amalia Hailey.    Flu: never Tetanus:11/2014 Pap Smear: hysterectomy Dentist: annually  Diet: She does eat some lean meat. She consumes more veggies than fruits. She does not eat fried food. She drinks mostly soda. Exercise: None   Review of Systems      Past Medical History:  Diagnosis Date  . Blood in stool   . 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 Depression    in high school  . DVT (deep venous thrombosis) ([place])    in left arm, finishing eliquis on tomorrow 12/01/2017  . Dysrhythmia   . GERD (gastroesophageal reflux disease)   . Lupus ([place])    tested positive many years ago, most recent came back negative.  . Migraines   . Neuromuscular disorder ([place])    right pinkey and ring finger  . Painful intercourse   . Pudendal neuralgia   . Vaginal Pap smear, abnormal     Current Outpatient Medications  Medication Sig Dispense Refill  . acetaminophen (TYLENOL) 500 MG tablet Take 500 mg by mouth every 6 (six) hours as needed.    Marland Kitchen acyclovir (ZOVIRAX) 800 MG tablet TAKE 1 TABLET BY MOUTH EVERY MORNING 90 tablet 0  . cyclobenzaprine (FLEXERIL) 10 MG tablet Take 10 mg by mouth 3 (three) times daily.    Marland Kitchen docusate sodium (COLACE) 250 MG  capsule Take by mouth 3 (three) times daily.     Marland Kitchen gabapentin (NEURONTIN) 800 MG tablet Take 1,200 mg by mouth 3 (three) times daily.    Marland Kitchen HYDROcodone-acetaminophen (NORCO/VICODIN) 5-325 MG tablet Take 1 tablet by mouth every 6 (six) hours as needed for moderate pain. 20 tablet 0  . imipramine (TOFRANIL) 50 MG tablet Take 50 mg by mouth at bedtime.    . lidocaine (XYLOCAINE) 5 % ointment APPLY TOPICALLY PRN  3  . predniSONE (DELTASONE) 20 MG tablet 2 tabs po for 5 days, then 1 tab po for 5 days 15 tablet 0  . spironolactone (ALDACTONE) 50 MG tablet Take 50-100 mg by mouth.    . tretinoin microspheres (RETIN-A MICRO) 0.04 % gel Apply topically.     No current facility-administered medications for this visit.     Allergies  Allergen Reactions  . Polycarbophil Other (See Comments)    Redness, skin peeling  . Adhesive [Tape] Rash    From bandaides. Tape is OK.    Family History  Problem Relation Age of Onset  . Diabetes Mother   . Hypertension Mother   . Cancer Father        lung  . Diabetes Father   . Hypertension Father   . Heart disease Maternal Grandmother   . Heart disease Maternal  Grandfather   . Cancer Maternal Grandfather   . Cancer Paternal Grandfather   . Cancer Other   . Cancer Other   . Stroke Neg Hx     Social History   Socioeconomic History  . Marital status: Legally Separated    Spouse name: Not on file  . Number of children: Not on file  . Years of education: Not on file  . Highest education level: Not on file  Occupational History  . Not on file  Social Needs  . Financial resource strain: Not on file  . Food insecurity:    Worry: Not on file    Inability: Not on file  . Transportation needs:    Medical: Not on file    Non-medical: Not on file  Tobacco Use  . Smoking status: Former Smoker    Last attempt to quit: 10/08/1999    Years since quitting: 18.3  . Smokeless tobacco: Never Used  . Tobacco comment: quit 2001  Substance and Sexual Activity    . Alcohol use: No    Alcohol/week: 0.0 oz  . Drug use: No  . Sexual activity: Yes    Birth control/protection: Surgical  Lifestyle  . Physical activity:    Days per week: Not on file    Minutes per session: Not on file  . Stress: Not on file  Relationships  . Social connections:    Talks on phone: Not on file    Gets together: Not on file    Attends religious service: Not on file    Active member of club or organization: Not on file    Attends meetings of clubs or organizations: Not on file    Relationship status: Not on file  . Intimate partner violence:    Fear of current or ex partner: Not on file    Emotionally abused: Not on file    Physically abused: Not on file    Forced sexual activity: Not on file  Other Topics Concern  . Not on file  Social History Narrative  . Not on file     Constitutional: Denies fever, malaise, fatigue, headache or abrupt weight changes.  HEENT: Denies eye pain, eye redness, ear pain, ringing in the ears, wax buildup, runny nose, nasal congestion, bloody nose, or sore throat. Respiratory: Denies difficulty breathing, shortness of breath, cough or sputum production.   Cardiovascular: Pt reports palpitations and swelling in feet. Denies chest pain, chest tightness, palpitations or swelling in the hands.  Gastrointestinal: Pt reports constipation or blood in stool. Denies abdominal pain, bloating, diarrhea.  GU: Pt reports chronic urethral pain. Denies urgency, frequency, pain with urination, burning sensation, blood in urine, odor or discharge. Musculoskeletal: Pt reports joint pain in hands. Denies decrease in range of motion, difficulty with gait, muscle pain or joint swelling.  Skin: Denies redness, rashes, lesions or ulcercations.  Neurological: Denies dizziness, difficulty with memory, difficulty with speech or problems with balance and coordination.  Psych: Pt has a history of anxiety and depression. Denies SI/HI.  No other specific  complaints in a complete review of systems (except as listed in HPI above).  Objective:   Physical Exam  BP 108/70 (BP Location: Left Arm, Patient Position: Sitting, Cuff Size: Small)   Pulse 94   Temp 98.4 F (36.9 C) (Oral)   Resp 16   Ht 5\' 7"  (1.702 m)   Wt 154 lb 12.8 oz (70.2 kg)   SpO2 96%   BMI 24.25 kg/m  Wt Readings from  Last 3 Encounters:  02/22/18 154 lb 12.8 oz (70.2 kg)  12/23/17 149 lb 12 oz (67.9 kg)  12/08/17 152 lb (68.9 kg)    General: Appears her stated age, well developed, well nourished in NAD. Skin: Warm, dry and intact.  HEENT: Head: normal shape and size; Eyes: sclera white, no icterus, conjunctiva pink, PERRLA and EOMs intact; Ears: Tm's gray and intact, normal light reflex; Throat/Mouth: Teeth present, mucosa pink and moist, no exudate, lesions or ulcerations noted.  Neck:  Neck supple, trachea midline. No masses, lumps or thyromegaly present.  Cardiovascular: Normal rate and rhythm. S1,S2 noted.  No murmur, rubs or gallops noted. No JVD or BLE edema.  Pulmonary/Chest: Normal effort and positive vesicular breath sounds. No respiratory distress. No wheezes, rales or ronchi noted.  Abdomen: Soft and nontender. Normal bowel sounds. No distention or masses noted. Liver, spleen and kidneys non palpable. Musculoskeletal: Strength 5/5 BUE/BLE. No difficulty with gait.  Neurological: Alert and oriented. Cranial nerves II-XII grossly intact. Coordination normal.  Psychiatric: Mood and affect normal. Behavior is normal. Judgment and thought content normal.    BMET    Component Value Date/Time   NA 140 01/03/2016 1511   K 3.9 01/03/2016 1511   CL 104 01/03/2016 1511   CO2 32 01/03/2016 1511   GLUCOSE 88 01/03/2016 1511   BUN 7 01/03/2016 1511   CREATININE 0.84 01/03/2016 1511   CALCIUM 9.4 01/03/2016 1511   GFRNONAA >60 08/19/2015 0158   GFRAA >60 08/19/2015 0158    Lipid Panel     Component Value Date/Time   CHOL 210 (H) 01/03/2016 1511   TRIG 58.0  01/03/2016 1511   HDL 44.70 01/03/2016 1511   CHOLHDL 5 01/03/2016 1511   VLDL 11.6 01/03/2016 1511   LDLCALC 154 (H) 01/03/2016 1511    CBC    Component Value Date/Time   WBC 6.1 01/03/2016 1511   RBC 4.45 01/03/2016 1511   HGB 14.2 01/03/2016 1511   HCT 42.4 01/03/2016 1511   PLT 216.0 01/03/2016 1511   MCV 95.3 01/03/2016 1511   MCH 30.6 08/19/2015 0158   MCHC 33.5 01/03/2016 1511   RDW 12.7 01/03/2016 1511    Hgb A1C No results found for: HGBA1C         Assessment & Plan:   Preventative Health Maintenance:  Encouraged her to get a flu shot in the fall Tetanus UTD Pap smear UTD Encouraged her to consume a balanced diet and exercise regimen Advised her to see a dentist annually Will check CBC, CMET, Lipid and Vit D today  Palpitations:  Indication for ECG: palpitations Interpretation: normal sinus rhythm, normal QT, no acute findings TSH today May need referral to cardiology for Holter monitor vs just prescribing Metoprolol  Blood in Stool:  Hx of hemorrhoids Referral placed to GI for further evaluation  RTC in 1 year, sooner if needed. Webb Silversmith, NP

## 2018-02-22 NOTE — Patient Instructions (Signed)

## 2018-02-23 ENCOUNTER — Other Ambulatory Visit: Payer: Self-pay | Admitting: Internal Medicine

## 2018-02-23 DIAGNOSIS — E559 Vitamin D deficiency, unspecified: Secondary | ICD-10-CM

## 2018-02-23 LAB — CBC
HCT: 42.3 % (ref 36.0–46.0)
Hemoglobin: 14.4 g/dL (ref 12.0–15.0)
MCHC: 34 g/dL (ref 30.0–36.0)
MCV: 94.9 fl (ref 78.0–100.0)
Platelets: 294 10*3/uL (ref 150.0–400.0)
RBC: 4.45 Mil/uL (ref 3.87–5.11)
RDW: 12.9 % (ref 11.5–15.5)
WBC: 6 10*3/uL (ref 4.0–10.5)

## 2018-02-23 LAB — LIPID PANEL
Cholesterol: 186 mg/dL (ref 0–200)
HDL: 37.7 mg/dL — ABNORMAL LOW (ref >39.00)
LDL Cholesterol: 136 mg/dL — ABNORMAL HIGH (ref 0–99)
NonHDL: 148.28
Total CHOL/HDL Ratio: 5
Triglycerides: 59 mg/dL (ref 0.0–149.0)
VLDL: 11.8 mg/dL (ref 0.0–40.0)

## 2018-02-23 LAB — COMPREHENSIVE METABOLIC PANEL
ALT: 13 U/L (ref 0–35)
AST: 13 U/L (ref 0–37)
Albumin: 4.3 g/dL (ref 3.5–5.2)
Alkaline Phosphatase: 56 U/L (ref 39–117)
BUN: 9 mg/dL (ref 6–23)
CO2: 31 mEq/L (ref 19–32)
Calcium: 9.2 mg/dL (ref 8.4–10.5)
Chloride: 103 mEq/L (ref 96–112)
Creatinine, Ser: 1.06 mg/dL (ref 0.40–1.20)
GFR: 61.99 mL/min (ref >60.00)
Glucose, Bld: 94 mg/dL (ref 70–99)
Potassium: 4.4 mEq/L (ref 3.5–5.1)
Sodium: 140 mEq/L (ref 135–145)
Total Bilirubin: 0.4 mg/dL (ref 0.2–1.2)
Total Protein: 7.4 g/dL (ref 6.0–8.3)

## 2018-02-23 LAB — VITAMIN D 25 HYDROXY (VIT D DEFICIENCY, FRACTURES): VITD: 21.08 ng/mL — ABNORMAL LOW (ref 30.00–100.00)

## 2018-02-23 LAB — TSH: TSH: 1.65 u[IU]/mL (ref 0.35–4.50)

## 2018-02-23 MED ORDER — VITAMIN D (ERGOCALCIFEROL) 1.25 MG (50000 UNIT) PO CAPS: 50000.0000 [IU] | ORAL_CAPSULE | ORAL | 0 refills | Status: DC

## 2018-03-04 ENCOUNTER — Encounter: Payer: Self-pay | Admitting: Internal Medicine

## 2018-03-15 DIAGNOSIS — K5909 Other constipation: Secondary | ICD-10-CM | POA: Diagnosis not present

## 2018-03-15 DIAGNOSIS — K625 Hemorrhage of anus and rectum: Secondary | ICD-10-CM | POA: Diagnosis not present

## 2018-03-29 ENCOUNTER — Encounter: Payer: BLUE CROSS/BLUE SHIELD | Admitting: Internal Medicine

## 2018-03-29 DIAGNOSIS — L811 Chloasma: Secondary | ICD-10-CM | POA: Diagnosis not present

## 2018-03-29 DIAGNOSIS — L700 Acne vulgaris: Secondary | ICD-10-CM | POA: Diagnosis not present

## 2018-03-29 DIAGNOSIS — R21 Rash and other nonspecific skin eruption: Secondary | ICD-10-CM | POA: Diagnosis not present

## 2018-03-29 DIAGNOSIS — L819 Disorder of pigmentation, unspecified: Secondary | ICD-10-CM | POA: Diagnosis not present

## 2018-04-08 ENCOUNTER — Encounter: Payer: Self-pay | Admitting: Internal Medicine

## 2018-04-27 DIAGNOSIS — K921 Melena: Secondary | ICD-10-CM | POA: Diagnosis not present

## 2018-04-27 DIAGNOSIS — K640 First degree hemorrhoids: Secondary | ICD-10-CM | POA: Diagnosis not present

## 2018-05-11 ENCOUNTER — Ambulatory Visit: Payer: BLUE CROSS/BLUE SHIELD | Admitting: Internal Medicine

## 2018-05-11 ENCOUNTER — Encounter: Payer: Self-pay | Admitting: Internal Medicine

## 2018-05-11 VITALS — BP 118/78 | HR 82 | Temp 98.3°F | Wt 160.0 lb

## 2018-05-11 DIAGNOSIS — R300 Dysuria: Secondary | ICD-10-CM

## 2018-05-11 DIAGNOSIS — Z23 Encounter for immunization: Secondary | ICD-10-CM | POA: Diagnosis not present

## 2018-05-11 DIAGNOSIS — R3989 Other symptoms and signs involving the genitourinary system: Secondary | ICD-10-CM | POA: Diagnosis not present

## 2018-05-11 LAB — POC URINALSYSI DIPSTICK (AUTOMATED)
Bilirubin, UA: NEGATIVE
Blood, UA: NEGATIVE
Glucose, UA: NEGATIVE
Ketones, UA: NEGATIVE
Leukocytes, UA: NEGATIVE
Nitrite, UA: NEGATIVE
Protein, UA: NEGATIVE
Spec Grav, UA: 1.02 (ref 1.010–1.025)
Urobilinogen, UA: 0.2 E.U./dL
pH, UA: 6 (ref 5.0–8.0)

## 2018-05-11 MED ORDER — PHENAZOPYRIDINE HCL 200 MG PO TABS: 200.0000 mg | ORAL_TABLET | Freq: Three times a day (TID) | ORAL | 0 refills | Status: DC | PRN

## 2018-05-11 NOTE — Patient Instructions (Signed)

## 2018-05-11 NOTE — Progress Notes (Signed)
HPI  Pt presents to the clinic today with c/o dysuria and bladder pressure. She reports this started 3 weeks ago. She denies urgency, frequency or blood in her urine. She denies vaginal discharge, irritation, odor or abnormal bleeding. She reports low grade fever, but denies chills or body aches. She takes Macrobid prophylactically after intercourse. She called urology and they advised her to take it 2 x day for 5 days, which she has been taking without much improvement. She has a history of chronic urethral pain, for which she takes Neurontin daily.   Review of Systems  Past Medical History:  Diagnosis Date  . Blood in stool   . 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 Depression    in high school  . DVT (deep venous thrombosis) ([place])    in left arm, finishing eliquis on tomorrow 12/01/2017  . Dysrhythmia   . GERD (gastroesophageal reflux disease)   . Lupus ([place])    tested positive many years ago, most recent came back negative.  . Migraines   . Neuromuscular disorder ([place])    right pinkey and ring finger  . Painful intercourse   . Pudendal neuralgia   . Vaginal Pap smear, abnormal     Family History  Problem Relation Age of Onset  . Diabetes Mother   . Hypertension Mother   . Cancer Father        lung  . Diabetes Father   . Hypertension Father   . Heart disease Maternal Grandmother   . Heart disease Maternal Grandfather   . Cancer Maternal Grandfather   . Cancer Paternal Grandfather   . Cancer Other   . Cancer Other   . Stroke Neg Hx     Social History   Socioeconomic History  . Marital status: Legally Separated    Spouse name: Not on file  . Number of children: Not on file  . Years of education: Not on file  . Highest education level: Not on file  Occupational History  . Not on file  Social Needs  . Financial resource strain: Not on file  . Food insecurity:    Worry: Not on file    Inability: Not on file  . Transportation needs:    Medical: Not on file    Non-medical: Not on file  Tobacco Use  . Smoking status: Former Smoker    Last attempt to quit: 10/08/1999    Years since quitting: 18.6  . Smokeless tobacco: Never Used  . Tobacco comment: quit 2001  Substance and Sexual Activity  . Alcohol use: No    Alcohol/week: 0.0 standard drinks  . Drug use: No  . Sexual activity: Yes    Birth control/protection: Surgical  Lifestyle  . Physical activity:    Days per week: Not on file    Minutes per session: Not on file  . Stress: Not on file  Relationships  . Social connections:    Talks on phone: Not on file    Gets together: Not on file    Attends religious service: Not on file    Active member of club or organization: Not on file    Attends meetings of clubs or organizations: Not on file    Relationship status: Not on file  . Intimate partner violence:    Fear of current or ex partner: Not on file    Emotionally abused: Not on file    Physically abused: Not on file    Forced sexual  activity: Not on file  Other Topics Concern  . Not on file  Social History Narrative  . Not on file    Allergies  Allergen Reactions  . Polycarbophil Other (See Comments)    Redness, skin peeling  . Adhesive [Tape] Rash    From bandaides. Tape is OK.     Constitutional: Denies fever, malaise, fatigue, headache or abrupt weight changes.   GU: Pt reports bladder pressure and pain with urination. Denies urgency, frequency, burning sensation, blood in urine, odor or discharge. Skin: Denies redness, rashes, lesions or ulcercations.   No other specific complaints in a complete review of systems (except as listed in HPI above).    Objective:   Physical Exam  BP 118/78   Pulse 82   Temp 98.3 F (36.8 C) (Oral)   Wt 160 lb (72.6 kg)   SpO2 98%   BMI 25.06 kg/m  Wt Readings from Last 3 Encounters:  05/11/18 160 lb (72.6 kg)  02/22/18 154 lb 12.8 oz (70.2 kg)  12/23/17 149 lb 12 oz (67.9 kg)    General: Appears her stated age, well  developed, well nourished in NAD. Cardiovascular: Normal rate and rhythm. S1,S2 noted.   Pulmonary/Chest: Normal effort and positive vesicular breath sounds. No respiratory distress. No wheezes, rales or ronchi noted.  Abdomen: Soft. Normal bowel sounds. No distention or masses noted.  Tender to palpation over the bladder area. No CVA tenderness.        Assessment & Plan:   Bladder Pressure and Dysuria:  Urinalysis: normal Will send urine culture Continue Macrobid per urology eRx for Pyridium 200 mg TID prn Drink plenty of fluids  RTC as needed or if symptoms persist. Webb Silversmith, NP

## 2018-05-11 NOTE — Addendum Note (Signed)
Addended by: Lurlean Nanny on: 05/11/2018 10:33 AM   Modules accepted: Orders

## 2018-05-12 ENCOUNTER — Other Ambulatory Visit: Payer: Self-pay | Admitting: Obstetrics and Gynecology

## 2018-05-12 LAB — URINE CULTURE
MICRO NUMBER:: 91208616
Result:: NO GROWTH
SPECIMEN QUALITY:: ADEQUATE

## 2018-05-25 ENCOUNTER — Encounter: Payer: Self-pay | Admitting: Obstetrics and Gynecology

## 2018-05-28 DIAGNOSIS — K648 Other hemorrhoids: Secondary | ICD-10-CM | POA: Diagnosis not present

## 2018-06-22 DIAGNOSIS — N94819 Vulvodynia, unspecified: Secondary | ICD-10-CM | POA: Diagnosis not present

## 2018-06-22 DIAGNOSIS — Z09 Encounter for follow-up examination after completed treatment for conditions other than malignant neoplasm: Secondary | ICD-10-CM | POA: Diagnosis not present

## 2018-06-22 DIAGNOSIS — R102 Pelvic and perineal pain: Secondary | ICD-10-CM | POA: Diagnosis not present

## 2018-06-22 DIAGNOSIS — G588 Other specified mononeuropathies: Secondary | ICD-10-CM | POA: Diagnosis not present

## 2018-06-22 DIAGNOSIS — N3010 Interstitial cystitis (chronic) without hematuria: Secondary | ICD-10-CM | POA: Diagnosis not present

## 2018-06-22 DIAGNOSIS — R8290 Unspecified abnormal findings in urine: Secondary | ICD-10-CM | POA: Diagnosis not present

## 2018-08-10 DIAGNOSIS — R8290 Unspecified abnormal findings in urine: Secondary | ICD-10-CM | POA: Diagnosis not present

## 2018-08-10 DIAGNOSIS — R3989 Other symptoms and signs involving the genitourinary system: Secondary | ICD-10-CM | POA: Diagnosis not present

## 2018-08-10 DIAGNOSIS — R102 Pelvic and perineal pain: Secondary | ICD-10-CM | POA: Diagnosis not present

## 2018-08-25 ENCOUNTER — Ambulatory Visit: Payer: BLUE CROSS/BLUE SHIELD | Admitting: Internal Medicine

## 2018-08-25 ENCOUNTER — Encounter: Payer: Self-pay | Admitting: Internal Medicine

## 2018-08-25 VITALS — BP 116/78 | HR 101 | Temp 97.9°F | Wt 158.0 lb

## 2018-08-25 DIAGNOSIS — Z113 Encounter for screening for infections with a predominantly sexual mode of transmission: Secondary | ICD-10-CM | POA: Diagnosis not present

## 2018-08-25 DIAGNOSIS — R300 Dysuria: Secondary | ICD-10-CM

## 2018-08-25 LAB — POC URINALSYSI DIPSTICK (AUTOMATED)
Bilirubin, UA: NEGATIVE
Blood, UA: NEGATIVE
Glucose, UA: NEGATIVE
Ketones, UA: NEGATIVE
Leukocytes, UA: NEGATIVE
Nitrite, UA: NEGATIVE
Protein, UA: NEGATIVE
Spec Grav, UA: 1.02 (ref 1.010–1.025)
Urobilinogen, UA: 0.2 E.U./dL
pH, UA: 5.5 (ref 5.0–8.0)

## 2018-08-25 NOTE — Patient Instructions (Signed)
Preventing Sexually Transmitted Infections, Adult Sexually transmitted infections (STIs) are diseases that are passed (transmitted) from person to person through bodily fluids exchanged during sex or sexual contact. Bodily fluids include saliva, semen, blood, vaginal mucus, and urine. You may have an increased risk for developing an STI if you have unprotected oral, vaginal, or anal sex. Some common STIs include:  Herpes.  Hepatitis B.  Chlamydia.  Gonorrhea.  Syphilis.  HPV (human papillomavirus).  HIV (human immunodeficiency virus), the virus that can cause AIDS (acquired immunodeficiency syndrome). How can I protect myself from sexually transmitted infections? The only way to completely prevent STIs is not to have sex of any kind (practice abstinence). This includes oral, vaginal, or anal sex. If you are sexually active, take these actions to lower your risk of getting an STI:  Have only one sex partner (be monogamous) or limit the number of sexual partners you have.  Stay up-to-date on immunizations. Certain vaccines can lower your risk of getting certain STIs, such as: ? Hepatitis A and B vaccines. You may have been vaccinated as a young child, but likely need a booster shot as a teen or young adult. ? HPV vaccine.  Use methods that prevent the exchange of body fluids between partners (barrier protection) every time you have sex. Barrier protection can be used during oral, vaginal, or anal sex. Commonly used barrier methods include: ? Female condom. ? Female condom. ? Dental dam.  Get tested regularly for STIs. Have your sexual partner get tested regularly as well.  Avoid mixing alcohol, drugs, and sex. Alcohol and drug use can affect your ability to make good decisions and can lead to risky sexual behaviors.  Ask your health care provider about taking pre-exposure prophylaxis (PrEP) to prevent HIV infection if you: ? Have a HIV-positive sexual partner. ? Have multiple sexual  partners or partners who do not know their HIV status, and do not regularly use a condom during sex. ? Use injection drugs and share needles. Birth control pills, injections, implants, and intrauterine devices (IUDs) do not protect against STIs. To prevent both STIs and pregnancy, always use a condom with another form of birth control. Some STIs, such as herpes, are spread through skin to skin contact. A condom does not protect you from getting such STIs. If you or your partner have herpes and there is an active flare with open sores, avoid all sexual contact. Why are these changes important? Taking steps to practice safe sex protects you and others. Many STIs can be cured. However, some STIs are not curable and will affect you for the rest of your life. STIs can be passed on to another person even if you do not have symptoms. What can happen if changes are not made? Certain STIs may:  Require you to take medicine for the rest of your life.  Affect your ability to have children (your fertility).  Increase your risk for developing another STI or certain serious health conditions, such as: ? Cervical cancer. ? Head and neck cancer. ? Pelvic inflammatory disease (PID) in women. ? Organ damage or damage to other parts of your body, if the infection spreads.  Be passed to a baby during childbirth. How are sexually transmitted infections treated? If you or your partner know or think that you may have an STI:  Talk with your health care provider about what can be done to treat it. Some STIs can be treated and cured with medicines.  For curable STIs, you and   your partner know or think that you may have an STI:  · Talk with your health care provider about what can be done to treat it. Some STIs can be treated and cured with medicines.  · For curable STIs, you and your partner should avoid sex during treatment and for several days after treatment is complete.  · You and your partner should both be treated at the same time, if there is any chance that your partner is infected as well. If you get treatment but your partner does not, your partner can re-infect you when you resume sexual contact.  · Do not  have unprotected sex.  Where to find more information  Learn more about sexually transmitted diseases and infections from:  · Centers for Disease Control and Prevention:  ? More information about specific STIs: www.cdc.gov/std  ? Find places to get sexual health counseling and treatment for free or for a low cost: gettested.cdc.gov  · [place] Department of Health and Human Services: www.womenshealth.gov/publications/our-publications/fact-sheet/sexually-transmitted-infections.html  Summary  · The only way to completely prevent STIs is not to have sex (practice abstinence), including oral, vaginal, or anal sex.  · STIs can spread through saliva, semen, blood, vaginal mucus, urine, or sexual contact.  · If you do have sex, limit your number of sexual partners and use a barrier protection method every time you have sex.  · If you develop an STI, get treated right away and ask your partner to be treated as well. Do not resume having sex until both of you have completed treatment for the STI.  This information is not intended to replace advice given to you by your health care provider. Make sure you discuss any questions you have with your health care provider.  Document Released: 07/17/2016 Document Revised: 12/25/2017 Document Reviewed: 07/17/2016  Elsevier Interactive Patient Education © 2019 Elsevier Inc.

## 2018-08-25 NOTE — Progress Notes (Signed)
HPI  Pt presents to the clinic today with c/o dysuria. She reports this started 3-4 days ago, but had improved some. She denies urgency, frequency or blood in her urine. She denies vaginal discharge, odor or abnormal uterine bleeding. She reports she found out her boyfriend cheated on her in October and would like to be screened for STD's.  She has a history of chronic urethral pain, follows with urology.    Review of Systems  Past Medical History:  Diagnosis Date  . Blood in stool   . 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 Depression    in high school  . DVT (deep venous thrombosis) ([place])    in left arm, finishing eliquis on tomorrow 12/01/2017  . Dysrhythmia   . GERD (gastroesophageal reflux disease)   . Lupus ([place])    tested positive many years ago, most recent came back negative.  . Migraines   . Neuromuscular disorder ([place])    right pinkey and ring finger  . Painful intercourse   . Pudendal neuralgia   . Vaginal Pap smear, abnormal     Family History  Problem Relation Age of Onset  . Diabetes Mother   . Hypertension Mother   . Cancer Father        lung  . Diabetes Father   . Hypertension Father   . Heart disease Maternal Grandmother   . Heart disease Maternal Grandfather   . Cancer Maternal Grandfather   . Cancer Paternal Grandfather   . Cancer Other   . Cancer Other   . Stroke Neg Hx     Social History   Socioeconomic History  . Marital status: Legally Separated    Spouse name: Not on file  . Number of children: Not on file  . Years of education: Not on file  . Highest education level: Not on file  Occupational History  . Not on file  Social Needs  . Financial resource strain: Not on file  . Food insecurity:    Worry: Not on file    Inability: Not on file  . Transportation needs:    Medical: Not on file    Non-medical: Not on file  Tobacco Use  . Smoking status: Former Smoker    Last attempt to quit: 10/08/1999    Years since quitting: 18.8  .  Smokeless tobacco: Never Used  . Tobacco comment: quit 2001  Substance and Sexual Activity  . Alcohol use: No    Alcohol/week: 0.0 standard drinks  . Drug use: No  . Sexual activity: Yes    Birth control/protection: Surgical  Lifestyle  . Physical activity:    Days per week: Not on file    Minutes per session: Not on file  . Stress: Not on file  Relationships  . Social connections:    Talks on phone: Not on file    Gets together: Not on file    Attends religious service: Not on file    Active member of club or organization: Not on file    Attends meetings of clubs or organizations: Not on file    Relationship status: Not on file  . Intimate partner violence:    Fear of current or ex partner: Not on file    Emotionally abused: Not on file    Physically abused: Not on file    Forced sexual activity: Not on file  Other Topics Concern  . Not on file  Social History Narrative  . Not  on file    Allergies  Allergen Reactions  . Polycarbophil Other (See Comments)    Redness, skin peeling  . Adhesive [Tape] Rash    From bandaides. Tape is OK.     Constitutional: Denies fever, malaise, fatigue, headache or abrupt weight changes.   GU: Pt reports pain with urination. Denies urgency, frequency, burning sensation, blood in urine, odor or discharge. Skin: Denies redness, rashes, lesions or ulcercations.   No other specific complaints in a complete review of systems (except as listed in HPI above).    Objective:   Physical Exam  BP 116/78   Pulse (!) 101   Temp 97.9 F (36.6 C) (Oral)   Wt 158 lb (71.7 kg)   SpO2 99%   BMI 24.75 kg/m   Wt Readings from Last 3 Encounters:  05/11/18 160 lb (72.6 kg)  02/22/18 154 lb 12.8 oz (70.2 kg)  12/23/17 149 lb 12 oz (67.9 kg)    General: Appears her stated age, well developed, well nourished in NAD. Abdomen: Soft, non tender. Normal bowel sounds. No distention or masses noted.   No CVA tenderness.        Assessment & Plan:    Dysuria:  Urinalysis: normal Will send urine culture Will send urine gonorrhea, chlamydia and trichomonas Will check HIV, RPR, Hep C Drink plenty of fluids  RTC as needed or if symptoms persist. Webb Silversmith, NP

## 2018-08-26 ENCOUNTER — Ambulatory Visit: Payer: BLUE CROSS/BLUE SHIELD | Admitting: Internal Medicine

## 2018-08-27 ENCOUNTER — Ambulatory Visit: Payer: BLUE CROSS/BLUE SHIELD | Admitting: Internal Medicine

## 2018-08-27 LAB — HEPATITIS C ANTIBODY
Hepatitis C Ab: NONREACTIVE
SIGNAL TO CUT-OFF: 0.02 (ref <1.00)

## 2018-08-27 LAB — C. TRACHOMATIS/N. GONORRHOEAE RNA
C. trachomatis RNA, TMA: NOT DETECTED
N. gonorrhoeae RNA, TMA: NOT DETECTED

## 2018-08-27 LAB — URINE CULTURE
MICRO NUMBER:: 90572
Result:: NO GROWTH
SPECIMEN QUALITY:: ADEQUATE

## 2018-08-27 LAB — HIV ANTIBODY (ROUTINE TESTING W REFLEX): HIV 1&2 Ab, 4th Generation: NONREACTIVE

## 2018-08-27 LAB — TRICHOMONAS VAGINALIS, PROBE AMP: Trichomonas vaginalis RNA: NOT DETECTED

## 2018-08-27 LAB — RPR: RPR Ser Ql: NONREACTIVE

## 2019-01-06 ENCOUNTER — Ambulatory Visit (INDEPENDENT_AMBULATORY_CARE_PROVIDER_SITE_OTHER): Payer: BC Managed Care – PPO | Admitting: Internal Medicine

## 2019-01-06 ENCOUNTER — Encounter: Payer: Self-pay | Admitting: Internal Medicine

## 2019-01-06 ENCOUNTER — Ambulatory Visit: Payer: BLUE CROSS/BLUE SHIELD | Admitting: Internal Medicine

## 2019-01-06 DIAGNOSIS — R197 Diarrhea, unspecified: Secondary | ICD-10-CM

## 2019-01-06 DIAGNOSIS — Z20828 Contact with and (suspected) exposure to other viral communicable diseases: Secondary | ICD-10-CM | POA: Diagnosis not present

## 2019-01-06 DIAGNOSIS — R1084 Generalized abdominal pain: Secondary | ICD-10-CM

## 2019-01-06 DIAGNOSIS — R109 Unspecified abdominal pain: Secondary | ICD-10-CM | POA: Diagnosis not present

## 2019-01-06 DIAGNOSIS — R112 Nausea with vomiting, unspecified: Secondary | ICD-10-CM | POA: Diagnosis not present

## 2019-01-06 DIAGNOSIS — M549 Dorsalgia, unspecified: Secondary | ICD-10-CM | POA: Diagnosis not present

## 2019-01-06 DIAGNOSIS — Z20822 Contact with and (suspected) exposure to covid-19: Secondary | ICD-10-CM

## 2019-01-06 DIAGNOSIS — Z87891 Personal history of nicotine dependence: Secondary | ICD-10-CM | POA: Diagnosis not present

## 2019-01-06 DIAGNOSIS — M791 Myalgia, unspecified site: Secondary | ICD-10-CM | POA: Diagnosis not present

## 2019-01-06 DIAGNOSIS — U071 COVID-19: Secondary | ICD-10-CM | POA: Diagnosis not present

## 2019-01-06 MED ORDER — PROMETHAZINE HCL 25 MG PO TABS: 25.0000 mg | ORAL_TABLET | Freq: Three times a day (TID) | ORAL | 0 refills | Status: DC | PRN

## 2019-01-06 NOTE — Patient Instructions (Signed)
Nausea, Adult Nausea is feeling sick to your stomach or feeling that you are about to throw up (vomit). Feeling sick to your stomach is usually not serious, but it may be an early sign of a more serious medical problem. As you feel sicker to your stomach, you may throw up. If you throw up, or if you are not able to drink enough fluids, there is a risk that you may lose too much water in your body (get dehydrated). If you lose too much water in your body, you may:  Feel tired.  Feel thirsty.  Have a dry mouth.  Have cracked lips.  Go pee (urinate) less often. Older adults and people who have other diseases or a weak body defense system (immune system) have a higher risk of losing too much water in the body. The main goals of treating this condition are:  To relieve your nausea.  To ensure your nausea occurs less often.  To prevent throwing up and losing too much fluid. Follow these instructions at home: Watch your symptoms for any changes. Tell your doctor about them. Follow these instructions as told by your doctor. Eating and drinking      Take an ORS (oral rehydration solution). This is a drink that is sold at pharmacies and stores.  Drink clear fluids in small amounts as you are able. These include: ? Water. ? Ice chips. ? Fruit juice that has water added (diluted fruit juice). ? Low-calorie sports drinks.  Eat bland, easy-to-digest foods in small amounts as you are able, such as: ? Bananas. ? Applesauce. ? Rice. ? Low-fat (lean) meats. ? Toast. ? Crackers.  Avoid drinking fluids that have a lot of sugar or caffeine in them. This includes energy drinks, sports drinks, and soda.  Avoid alcohol.  Avoid spicy or fatty foods. General instructions  Take over-the-counter and prescription medicines only as told by your doctor.  Rest at home while you get better.  Drink enough fluid to keep your pee (urine) pale yellow.  Take slow and deep breaths when you feel  sick to your stomach.  Avoid food or things that have strong smells.  Wash your hands often with soap and water. If you cannot use soap and water, use hand sanitizer.  Make sure that all people in your home wash their hands well and often.  Keep all follow-up visits as told by your doctor. This is important. Contact a doctor if:  You feel sicker to your stomach.  You feel sick to your stomach for more than 2 days.  You throw up.  You are not able to drink fluids without throwing up.  You have new symptoms.  You have a fever.  You have a headache.  You have muscle cramps.  You have a rash.  You have pain while peeing.  You feel light-headed or dizzy. Get help right away if:  You have pain in your chest, neck, arm, or jaw.  You feel very weak or you pass out (faint).  You have throw up that is bright red or looks like coffee grounds.  You have bloody or black poop (stools) or poop that looks like tar.  You have a very bad headache, a stiff neck, or both.  You have very bad pain, cramping, or bloating in your belly (abdomen).  You have trouble breathing or you are breathing very quickly.  Your heart is beating very quickly.  Your skin feels cold and clammy.  You feel confused.    You have signs of losing too much water in your body, such as: ? Dark pee, very little pee, or no pee. ? Cracked lips. ? Dry mouth. ? Sunken eyes. ? Sleepiness. ? Weakness. These symptoms may be an emergency. Do not wait to see if the symptoms will go away. Get medical help right away. Call your local emergency services (911 in the [place] Do not drive yourself to the hospital. Summary  Nausea is feeling sick to your stomach or feeling that you are about to throw up (vomit).  If you throw up, or if you are not able to drink enough fluids, there is a risk that you may lose too much water in your body (get dehydrated).  Eat and drink what your doctor tells you. Take  over-the-counter and prescription medicines only as told by your doctor.  Contact a doctor right away if your symptoms get worse or you have new symptoms.  Keep all follow-up visits as told by your doctor. This is important. This information is not intended to replace advice given to you by your health care provider. Make sure you discuss any questions you have with your health care provider. Document Released: 07/10/2011 Document Revised: 12/29/2017 Document Reviewed: 12/29/2017 Elsevier Interactive Patient Education  2019 Elsevier Inc.  

## 2019-01-06 NOTE — Progress Notes (Signed)
Virtual Visit via Video Note  I connected with Sonia Smith on 01/06/19 at 12:15 PM EDT by a video enabled telemedicine application and verified that I am speaking with the correct person using two identifiers.  Location: Patient: Home Provider: Office   I discussed the limitations of evaluation and management by telemedicine and the availability of in person appointments. The patient expressed understanding and agreed to proceed.  History of Present Illness:  Pt due for ER follow up. Went to the ER today for fever, shortness of breath, nausea, vomiting, abdominal pain, back pain and diarrhea. This started 1 week ago. She is having trouble keeping anything down. She was given 1L of IV fluids. ECG and labs were unremarkable. She was tested for COVID but labs are not back yet. She reports some improvement in abdominal pain and back pain. Her biggest concern is still the nausea. She has tried some Zofran but reports it has not helped. She denies recent changes in diet or medications. Her live in boyfriend tested positive for COVID 2 weeks ago.   Past Medical History:  Diagnosis Date  . Blood in stool   . 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 Depression    in high school  . DVT (deep venous thrombosis) ([place])    in left arm, finishing eliquis on tomorrow 12/01/2017  . Dysrhythmia   . GERD (gastroesophageal reflux disease)   . Lupus ([place])    tested positive many years ago, most recent came back negative.  . Migraines   . Neuromuscular disorder ([place])    right pinkey and ring finger  . Painful intercourse   . Pudendal neuralgia   . Vaginal Pap smear, abnormal     Current Outpatient Medications  Medication Sig Dispense Refill  . acetaminophen (TYLENOL) 500 MG tablet Take 500 mg by mouth every 6 (six) hours as needed.    Marland Kitchen acyclovir (ZOVIRAX) 800 MG tablet TAKE 1 TABLET BY MOUTH EVERY MORNING 90 tablet 2  . docusate sodium (COLACE) 250 MG capsule Take by mouth 3 (three) times  daily.     Marland Kitchen gabapentin (NEURONTIN) 800 MG tablet Take 1,200 mg by mouth 3 (three) times daily.    Marland Kitchen lidocaine (XYLOCAINE) 5 % ointment APPLY TOPICALLY PRN  3  . nitrofurantoin (MACRODANTIN) 50 MG capsule   0  . phenazopyridine (PYRIDIUM) 200 MG tablet Take 1 tablet (200 mg total) by mouth 3 (three) times daily as needed for pain. 30 tablet 0  . spironolactone (ALDACTONE) 50 MG tablet Take 50-100 mg by mouth.    . tretinoin microspheres (RETIN-A MICRO) 0.04 % gel Apply topically.     No current facility-administered medications for this visit.     Allergies  Allergen Reactions  . Polycarbophil Other (See Comments)    Redness, skin peeling  . Adhesive [Tape] Rash    From bandaides. Tape is OK.    Family History  Problem Relation Age of Onset  . Diabetes Mother   . Hypertension Mother   . Cancer Father        lung  . Diabetes Father   . Hypertension Father   . Heart disease Maternal Grandmother   . Heart disease Maternal Grandfather   . Cancer Maternal Grandfather   . Cancer Paternal Grandfather   . Cancer Other   . Cancer Other   . Stroke Neg Hx     Social History   Socioeconomic History  . Marital status: Legally Separated  Spouse name: Not on file  . Number of children: Not on file  . Years of education: Not on file  . Highest education level: Not on file  Occupational History  . Not on file  Social Needs  . Financial resource strain: Not on file  . Food insecurity:    Worry: Not on file    Inability: Not on file  . Transportation needs:    Medical: Not on file    Non-medical: Not on file  Tobacco Use  . Smoking status: Former Smoker    Last attempt to quit: 10/08/1999    Years since quitting: 19.2  . Smokeless tobacco: Never Used  . Tobacco comment: quit 2001  Substance and Sexual Activity  . Alcohol use: No    Alcohol/week: 0.0 standard drinks  . Drug use: No  . Sexual activity: Yes    Birth control/protection: Surgical  Lifestyle  . Physical  activity:    Days per week: Not on file    Minutes per session: Not on file  . Stress: Not on file  Relationships  . Social connections:    Talks on phone: Not on file    Gets together: Not on file    Attends religious service: Not on file    Active member of club or organization: Not on file    Attends meetings of clubs or organizations: Not on file    Relationship status: Not on file  . Intimate partner violence:    Fear of current or ex partner: Not on file    Emotionally abused: Not on file    Physically abused: Not on file    Forced sexual activity: Not on file  Other Topics Concern  . Not on file  Social History Narrative  . Not on file     Constitutional: Denies fever, malaise, fatigue, headache or abrupt weight changes.  HEENT: Denies eye pain, eye redness, ear pain, ringing in the ears, wax buildup, runny nose, nasal congestion, bloody nose, or sore throat. Respiratory: Denies difficulty breathing, shortness of breath, cough or sputum production.   Cardiovascular: Denies chest pain, chest tightness, palpitations or swelling in the hands or feet.  Gastrointestinal: Pt reports nausea, vomiting and diarrhea. Denies bloating, constipation, diarrhea or blood in the stool.  GU: Denies urgency, frequency, pain with urination, burning sensation, blood in urine, odor or discharge. Musculoskeletal: Pt reports back pain (improved). Denies decrease in range of motion, difficulty with gait, muscle pain or joint pain and swelling.  Skin: Denies redness, rashes, lesions or ulcercations.  Neurological: Denies dizziness, difficulty with memory, difficulty with speech or problems with balance and coordination.  Psych: Denies anxiety, depression, SI/HI.  No other specific complaints in a complete review of systems (except as listed in HPI above).   Wt Readings from Last 3 Encounters:  08/25/18 158 lb (71.7 kg)  05/11/18 160 lb (72.6 kg)  02/22/18 154 lb 12.8 oz (70.2 kg)    General:  Appears her stated age, well developed, well nourished in NAD. Pulmonary/Chest: Normal effort. No respiratory distress.  Abdomen: Soft and nontender. Normal bowel sounds. No distention or masses noted. Liver, spleen and kidneys non palpable. Neurological: Alert and oriented.    BMET    Component Value Date/Time   NA 140 02/22/2018 1608   K 4.4 02/22/2018 1608   CL 103 02/22/2018 1608   CO2 31 02/22/2018 1608   GLUCOSE 94 02/22/2018 1608   BUN 9 02/22/2018 1608   CREATININE 1.06 02/22/2018 1608  CALCIUM 9.2 02/22/2018 1608   GFRNONAA >60 08/19/2015 0158   GFRAA >60 08/19/2015 0158    Lipid Panel     Component Value Date/Time   CHOL 186 02/22/2018 1608   TRIG 59.0 02/22/2018 1608   HDL 37.70 (L) 02/22/2018 1608   CHOLHDL 5 02/22/2018 1608   VLDL 11.8 02/22/2018 1608   LDLCALC 136 (H) 02/22/2018 1608    CBC    Component Value Date/Time   WBC 6.0 02/22/2018 1608   RBC 4.45 02/22/2018 1608   HGB 14.4 02/22/2018 1608   HCT 42.3 02/22/2018 1608   PLT 294.0 02/22/2018 1608   MCV 94.9 02/22/2018 1608   MCH 30.6 08/19/2015 0158   MCHC 34.0 02/22/2018 1608   RDW 12.9 02/22/2018 1608    Hgb A1C No results found for: HGBA1C      Assessment and Plan:  ER Follow Up for N/V, Diarrhea, Abdominal Pain, Exposure to COVID 19:  ER notes and labs reviewed Feeling some better after receiving fluids RX for Promethazine 25 mg Q8H prn Get rest and drink plenty of fluids Advance diet as tolerated Continue quarantine until COVID results come back  Return precautions discussed Follow Up Instructions:    I discussed the assessment and treatment plan with the patient. The patient was provided an opportunity to ask questions and all were answered. The patient agreed with the plan and demonstrated an understanding of the instructions.   The patient was advised to call back or seek an in-person evaluation if the symptoms worsen or if the condition fails to improve as  anticipated.   Webb Silversmith, NP

## 2019-01-07 ENCOUNTER — Encounter: Payer: Self-pay | Admitting: Internal Medicine

## 2019-02-09 ENCOUNTER — Encounter: Payer: Self-pay | Admitting: Internal Medicine

## 2019-02-22 DIAGNOSIS — R102 Pelvic and perineal pain: Secondary | ICD-10-CM | POA: Diagnosis not present

## 2019-02-22 DIAGNOSIS — N94819 Vulvodynia, unspecified: Secondary | ICD-10-CM | POA: Diagnosis not present

## 2019-02-22 DIAGNOSIS — N3010 Interstitial cystitis (chronic) without hematuria: Secondary | ICD-10-CM | POA: Diagnosis not present

## 2019-02-22 DIAGNOSIS — G588 Other specified mononeuropathies: Secondary | ICD-10-CM | POA: Diagnosis not present

## 2019-02-26 ENCOUNTER — Encounter: Payer: Self-pay | Admitting: Internal Medicine

## 2019-03-07 ENCOUNTER — Encounter: Payer: Self-pay | Admitting: Internal Medicine

## 2019-03-07 ENCOUNTER — Other Ambulatory Visit: Payer: Self-pay

## 2019-03-07 ENCOUNTER — Ambulatory Visit: Payer: BC Managed Care – PPO | Admitting: Internal Medicine

## 2019-03-07 ENCOUNTER — Telehealth: Payer: Self-pay

## 2019-03-07 VITALS — BP 110/74 | HR 108 | Temp 98.3°F | Wt 149.0 lb

## 2019-03-07 DIAGNOSIS — F5104 Psychophysiologic insomnia: Secondary | ICD-10-CM

## 2019-03-07 DIAGNOSIS — R5383 Other fatigue: Secondary | ICD-10-CM

## 2019-03-07 DIAGNOSIS — L659 Nonscarring hair loss, unspecified: Secondary | ICD-10-CM | POA: Diagnosis not present

## 2019-03-07 LAB — COMPREHENSIVE METABOLIC PANEL
ALT: 9 U/L (ref 0–35)
AST: 11 U/L (ref 0–37)
Albumin: 4.5 g/dL (ref 3.5–5.2)
Alkaline Phosphatase: 56 U/L (ref 39–117)
BUN: 8 mg/dL (ref 6–23)
CO2: 31 mEq/L (ref 19–32)
Calcium: 9.7 mg/dL (ref 8.4–10.5)
Chloride: 104 mEq/L (ref 96–112)
Creatinine, Ser: 0.92 mg/dL (ref 0.40–1.20)
GFR: 68.3 mL/min (ref >60.00)
Glucose, Bld: 95 mg/dL (ref 70–99)
Potassium: 4.5 mEq/L (ref 3.5–5.1)
Sodium: 141 mEq/L (ref 135–145)
Total Bilirubin: 0.5 mg/dL (ref 0.2–1.2)
Total Protein: 7 g/dL (ref 6.0–8.3)

## 2019-03-07 LAB — CBC
HCT: 44.8 % (ref 36.0–46.0)
Hemoglobin: 15 g/dL (ref 12.0–15.0)
MCHC: 33.5 g/dL (ref 30.0–36.0)
MCV: 95.6 fl (ref 78.0–100.0)
Platelets: 280 10*3/uL (ref 150.0–400.0)
RBC: 4.68 Mil/uL (ref 3.87–5.11)
RDW: 12.4 % (ref 11.5–15.5)
WBC: 7.3 10*3/uL (ref 4.0–10.5)

## 2019-03-07 LAB — VITAMIN B12: Vitamin B-12: 213 pg/mL (ref 211–911)

## 2019-03-07 LAB — VITAMIN D 25 HYDROXY (VIT D DEFICIENCY, FRACTURES): VITD: >120 ng/mL

## 2019-03-07 LAB — TSH: TSH: 1.52 u[IU]/mL (ref 0.35–4.50)

## 2019-03-07 NOTE — Patient Instructions (Signed)
Alopecia Areata, Adult  Alopecia areata is a condition that causes you to lose hair. You may lose hair on your scalp in patches. In some cases, you may lose all the hair on your scalp (alopecia totalis) or all the hair from your face and body (alopecia universalis). Alopecia areata is an autoimmune disease. This means that your body's defense system (immune system) mistakes normal parts of the body for germs or other things that can make you sick. When you have alopecia areata, the immune system attacks the hair follicles. Alopecia areata usually develops in childhood, but it can develop at any age. For some people, their hair grows back on its own and hair loss does not happen again. For others, their hair may fall out and grow back in cycles. The hair loss may last many years. Having this condition can be emotionally difficult, but it is not dangerous. What are the causes? The cause of this condition is not known. What increases the risk? This condition is more likely to develop in people who have:  A family history of alopecia.  A family history of another autoimmune disease, including type 1 diabetes and rheumatoid arthritis.  Asthma and allergies.  Down syndrome. What are the signs or symptoms? Round spots of patchy hair loss on the scalp is the main symptom of this condition. The spots may be mildly itchy. Other symptoms include:  Short dark hairs in the bald patches that are wider at the top (exclamation point hairs).  Dents, white spots, or lines in the fingernails or toenails.  Balding and body hair loss. This is rare. How is this diagnosed? This condition is diagnosed based on your symptoms and family history. Your health care provider will also check your scalp skin, teeth, and nails. Your health care provider may refer you to a specialist in hair and skin disorders (dermatologist). You may also have tests, including:  A hair pull test.  Blood tests or other screening tests  to check for autoimmune diseases, such as thyroid disease or diabetes.  Skin biopsy to confirm the diagnosis.  A procedure to examine the skin with a lighted magnifying instrument (dermoscopy). How is this treated? There is no cure for alopecia areata. Treatment is aimed at promoting the regrowth of hair and preventing the immune system from overreacting. No single treatment is right for all people with alopecia areata. It depends on the type of hair loss you have and how severe it is. Work with your health care provider to find the best treatment for you. Treatment may include:  Having regular checkups to make sure the condition is not getting worse (watchful waiting).  Steroid creams or pills for 6-8 weeks to stop the immune reaction and help hair to regrow more quickly.  Other topical medicines to alter the immune system response and support the hair growth cycle.  Steroid injections.  Therapy and counseling with a support group or therapist if you are having trouble coping with hair loss. Follow these instructions at home:  Learn as much as you can about your condition.  Apply topical creams only as told by your health care provider.  Take over-the-counter and prescription medicines only as told by your health care provider.  Consider getting a wig or products to make hair look fuller or to cover bald spots, if you feel uncomfortable with your appearance.  Get therapy or counseling if you are having a hard time coping with hair loss. Ask your health care provider to recommend  a counselor or support group.  Keep all follow-up visits as told by your health care provider. This is important. Contact a health care provider if:  Your hair loss gets worse, even with treatment.  You have new symptoms.  You are struggling emotionally. Summary  Alopecia areata is an autoimmune condition that makes your body's defense system (immune system) attack the hair follicles. This causes you  to lose hair.  Treatments may include regular checkups to make sure that the condition is not getting worse (watchful waiting), medicines, and steroid injections. This information is not intended to replace advice given to you by your health care provider. Make sure you discuss any questions you have with your health care provider. Document Released: 02/23/2004 Document Revised: 07/03/2017 Document Reviewed: 08/08/2016 Elsevier Patient Education  2020 [employer].

## 2019-03-07 NOTE — Progress Notes (Signed)
Subjective:    Patient ID: Sonia Smith, female    DOB: 12-10-1980, 38 y.o.   MRN: [id number]  HPI  Pt presents to the clinic today to discuss hair loss.  This started approximately 3 weeks ago after she was diagnosed with COVID-19.  She reports she has lost approximately half of her hair volume since the illness.  She reports it falls out in clumps when brushing and when washing her hair.  She denies bald spots. She endorses taking a daily Vitamin D supplement, Calcium supplement, and Multivitamin.  Associated symptoms include fatigue and insomnia.  She denies any numbness, tingling, or cold intolerances.    Review of Systems  Past Medical History:  Diagnosis Date  . Blood in stool   . 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 Depression    in high school  . DVT (deep venous thrombosis) ([place])    in left arm, finishing eliquis on tomorrow 12/01/2017  . Dysrhythmia   . GERD (gastroesophageal reflux disease)   . Lupus ([place])    tested positive many years ago, most recent came back negative.  . Migraines   . Neuromuscular disorder ([place])    right pinkey and ring finger  . Painful intercourse   . Pudendal neuralgia   . Vaginal Pap smear, abnormal     Current Outpatient Medications  Medication Sig Dispense Refill  . acetaminophen (TYLENOL) 500 MG tablet Take 500 mg by mouth every 6 (six) hours as needed.    Marland Kitchen acyclovir (ZOVIRAX) 800 MG tablet TAKE 1 TABLET BY MOUTH EVERY MORNING 90 tablet 2  . docusate sodium (COLACE) 250 MG capsule Take by mouth 3 (three) times daily.     Marland Kitchen gabapentin (NEURONTIN) 800 MG tablet Take 1,200 mg by mouth 3 (three) times daily.    Marland Kitchen lidocaine (XYLOCAINE) 5 % ointment APPLY TOPICALLY PRN  3  . nitrofurantoin (MACRODANTIN) 50 MG capsule   0  . phenazopyridine (PYRIDIUM) 200 MG tablet Take 1 tablet (200 mg total) by mouth 3 (three) times daily as needed for pain. 30 tablet 0  . promethazine (PHENERGAN) 25 MG tablet Take 1 tablet (25 mg total) by mouth  every 8 (eight) hours as needed for nausea or vomiting. 30 tablet 0  . spironolactone (ALDACTONE) 50 MG tablet Take 50-100 mg by mouth.    . tretinoin microspheres (RETIN-A MICRO) 0.04 % gel Apply topically.     No current facility-administered medications for this visit.     Allergies  Allergen Reactions  . Polycarbophil Other (See Comments)    Redness, skin peeling  . Adhesive [Tape] Rash    From bandaides. Tape is OK.    Family History  Problem Relation Age of Onset  . Diabetes Mother   . Hypertension Mother   . Cancer Father        lung  . Diabetes Father   . Hypertension Father   . Heart disease Maternal Grandmother   . Heart disease Maternal Grandfather   . Cancer Maternal Grandfather   . Cancer Paternal Grandfather   . Cancer Other   . Cancer Other   . Stroke Neg Hx     Social History   Socioeconomic History  . Marital status: Legally Separated    Spouse name: Not on file  . Number of children: Not on file  . Years of education: Not on file  . Highest education level: Not on file  Occupational History  . Not on file  Social Needs  . Financial resource strain: Not on file  . Food insecurity    Worry: Not on file    Inability: Not on file  . Transportation needs    Medical: Not on file    Non-medical: Not on file  Tobacco Use  . Smoking status: Former Smoker    Quit date: 10/08/1999    Years since quitting: 19.4  . Smokeless tobacco: Never Used  . Tobacco comment: quit 2001  Substance and Sexual Activity  . Alcohol use: No    Alcohol/week: 0.0 standard drinks  . Drug use: No  . Sexual activity: Yes    Birth control/protection: Surgical  Lifestyle  . Physical activity    Days per week: Not on file    Minutes per session: Not on file  . Stress: Not on file  Relationships  . Social Herbalist on phone: Not on file    Gets together: Not on file    Attends religious service: Not on file    Active member of club or organization: Not on  file    Attends meetings of clubs or organizations: Not on file    Relationship status: Not on file  . Intimate partner violence    Fear of current or ex partner: Not on file    Emotionally abused: Not on file    Physically abused: Not on file    Forced sexual activity: Not on file  Other Topics Concern  . Not on file  Social History Narrative  . Not on file     Constitutional: Complains of persistent fatigue since COVID diagnosis.  Denies fever, malaise,  headache or abrupt weight changes.  HEENT: Denies eye pain, eye redness, ear pain, ringing in the ears, wax buildup, runny nose, nasal congestion, bloody nose, or sore throat. Respiratory: Denies difficulty breathing, shortness of breath, cough or sputum production.   Cardiovascular: Denies chest pain, chest tightness, palpitations or swelling in the hands or feet.  Musculoskeletal: Denies decrease in range of motion, difficulty with gait, muscle pain or joint pain and swelling.  Skin: Pt reports hair loss. Denies redness, rashes, lesions or ulcercations.  Neurological: Pt reports insomnia. Denies dizziness, difficulty with memory, difficulty with speech or problems with balance and coordination.  Psych: Denies anxiety, depression, SI/HI.  No other specific complaints in a complete review of systems (except as listed in HPI above).     Objective:   Physical Exam   BP 110/74   Pulse (!) 108   Temp 98.3 F (36.8 C) (Temporal)   Wt 149 lb (67.6 kg)   SpO2 98%   BMI 23.34 kg/m    Wt Readings from Last 3 Encounters:  08/25/18 158 lb (71.7 kg)  05/11/18 160 lb (72.6 kg)  02/22/18 154 lb 12.8 oz (70.2 kg)    General: Appears her stated age, well developed, well nourished in NAD. Skin: Moderate hair loss noted diffusely.  Little hair regrowth noted.   Cardiovascular: Tachycardic with normal rhythm. S1,S2 noted.  No murmur, rubs or gallops noted. No JVD or BLE edema.  Pulmonary/Chest: Normal effort and positive vesicular  breath sounds. No respiratory distress. No wheezes, rales or ronchi noted.   BMET    Component Value Date/Time   NA 140 02/22/2018 1608   K 4.4 02/22/2018 1608   CL 103 02/22/2018 1608   CO2 31 02/22/2018 1608   GLUCOSE 94 02/22/2018 1608   BUN 9 02/22/2018 1608   CREATININE 1.06 02/22/2018 1608  CALCIUM 9.2 02/22/2018 1608   GFRNONAA >60 08/19/2015 0158   GFRAA >60 08/19/2015 0158    Lipid Panel     Component Value Date/Time   CHOL 186 02/22/2018 1608   TRIG 59.0 02/22/2018 1608   HDL 37.70 (L) 02/22/2018 1608   CHOLHDL 5 02/22/2018 1608   VLDL 11.8 02/22/2018 1608   LDLCALC 136 (H) 02/22/2018 1608    CBC    Component Value Date/Time   WBC 6.0 02/22/2018 1608   RBC 4.45 02/22/2018 1608   HGB 14.4 02/22/2018 1608   HCT 42.3 02/22/2018 1608   PLT 294.0 02/22/2018 1608   MCV 94.9 02/22/2018 1608   MCH 30.6 08/19/2015 0158   MCHC 34.0 02/22/2018 1608   RDW 12.9 02/22/2018 1608    Hgb A1C No results found for: HGBA1C         Assessment & Plan:    Hair Loss, Fatigue and Insomnia:  ? Immunologic vs vitamin deficiency given recent diagnosis of COVID. CBC, CMP, TSH, Vitamin D, Vitamin B12 checked today. Will follow up when labs complete. Get rest Try Biotin 10000 mg daily  Return precautions discussed.  Webb Silversmith, NP

## 2019-03-07 NOTE — Telephone Encounter (Signed)
[facility] reported a critical result at 1500  Vitamin D greater than 120

## 2019-03-08 ENCOUNTER — Encounter: Payer: Self-pay | Admitting: Internal Medicine

## 2019-03-08 NOTE — Telephone Encounter (Signed)
Noted  

## 2019-06-20 ENCOUNTER — Encounter: Payer: Self-pay | Admitting: Internal Medicine

## 2019-08-30 DIAGNOSIS — N3941 Urge incontinence: Secondary | ICD-10-CM | POA: Diagnosis not present

## 2019-08-30 DIAGNOSIS — G588 Other specified mononeuropathies: Secondary | ICD-10-CM | POA: Diagnosis not present

## 2019-10-24 DIAGNOSIS — H16141 Punctate keratitis, right eye: Secondary | ICD-10-CM | POA: Diagnosis not present

## 2020-04-18 ENCOUNTER — Ambulatory Visit: Payer: BC Managed Care – PPO | Admitting: Dermatology

## 2020-04-18 ENCOUNTER — Other Ambulatory Visit: Payer: Self-pay

## 2020-04-18 DIAGNOSIS — Z86018 Personal history of other benign neoplasm: Secondary | ICD-10-CM

## 2020-04-18 DIAGNOSIS — D485 Neoplasm of uncertain behavior of skin: Secondary | ICD-10-CM | POA: Diagnosis not present

## 2020-04-18 DIAGNOSIS — L304 Erythema intertrigo: Secondary | ICD-10-CM | POA: Diagnosis not present

## 2020-04-18 DIAGNOSIS — D179 Benign lipomatous neoplasm, unspecified: Secondary | ICD-10-CM

## 2020-04-18 DIAGNOSIS — D235 Other benign neoplasm of skin of trunk: Secondary | ICD-10-CM | POA: Diagnosis not present

## 2020-04-18 DIAGNOSIS — D489 Neoplasm of uncertain behavior, unspecified: Secondary | ICD-10-CM

## 2020-04-18 MED ORDER — HYDROCORTISONE 2.5 % EX CREA: TOPICAL_CREAM | CUTANEOUS | 1 refills | Status: DC

## 2020-04-18 MED ORDER — KETOCONAZOLE 2 % EX CREA: TOPICAL_CREAM | CUTANEOUS | 1 refills | Status: DC

## 2020-04-18 NOTE — Progress Notes (Signed)
   Follow-Up Visit   Subjective  Sonia Smith is a 39 y.o. female who presents for the following: Lump (right shoulder, noticed 2 months ago, sore at times), Rash (under breasts. Itchy, red, and scaly. Using Monistat cream, helps a little), and Bump (groin area. Had ingrown hair a year ago, and had another bump come up afterwards. Itchy.). She has a h/o dysplastic nevus toe   The following portions of the chart were reviewed this encounter and updated as appropriate:      Review of Systems:  No other skin or systemic complaints except as noted in HPI or Assessment and Plan.  Objective  Well appearing patient in no apparent distress; mood and affect are within normal limits.  A focused examination was performed including shoulder, trunk, groin. Relevant physical exam findings are noted in the Assessment and Plan.  Objective  Right Anterior Shoulder: 1.5cm rubbery nodule  Objective  Bil inframammary: Mild erythema with small pink papules  Objective  Super Pubic: 4.30mm Firm pink/brown papule   Assessment & Plan   History of Dysplastic Nevus (mild/mod) with recurrence, but stable - Light brown macule, 2.76mm, at anterior edge of scar left 4th web space - Benign appearing. Observe. - Recommend regular full body skin exams - Recommend daily broad spectrum sunscreen SPF 30+ to sun-exposed areas, reapply every 2 hours as needed.  - Call if any new or changing lesions are noted between office visits   Lipoma, unspecified site Right Anterior Shoulder   Lipoma with symptoms and/or recent change.  Discussed surgical excision to remove, including resulting scar and possible recurrence.  Patient will schedule for surgery. Pre-op information given.   Erythema intertrigo Bil inframammary  Start ketoconazole 2% cream Apply to AAs rash QD/BID as directed.   Start hydrocortisone 2.5% cream Apply to AAs rash QD/BID as directed prn itch.  ketoconazole (NIZORAL) 2 % cream - Bil  inframammary  hydrocortisone 2.5 % cream - Bil inframammary  Neoplasm of uncertain behavior Super Pubic  Epidermal / dermal shaving  Lesion diameter (cm):  0.4 Informed consent: discussed and consent obtained   Patient was prepped and draped in usual sterile fashion: Area prepped with alcohol. Anesthesia: the lesion was anesthetized in a standard fashion   Anesthetic:  1% lidocaine w/ epinephrine 1-100,000 buffered w/ 8.4% NaHCO3 Instrument used: flexible razor blade   Hemostasis achieved with: pressure, aluminum chloride and electrodesiccation   Outcome: patient tolerated procedure well   Post-procedure details: wound care instructions given   Post-procedure details comment:  Ointment and small bandage applied  Specimen 1 - Surgical pathology Differential Diagnosis: Dermatofibroma vs other Check Margins: No 4.51mm Firm pink/brown papule  Return for lipoma excision of the right anterior shoulder.   IJamesetta Smith, CMA, am acting as scribe for [name], MD .  Documentation: I have reviewed the above documentation for accuracy and completeness, and I agree with the above.  [name] MD

## 2020-04-18 NOTE — Patient Instructions (Addendum)
Pre-Operative Instructions  You are scheduled for a surgical procedure at [facility]. We recommend you read the following instructions. If you have any questions or concerns, please call the office at [phone].  1. Shower and wash the entire body with soap and water the day of your surgery paying special attention to cleansing at and around the planned surgery site.  2. Avoid aspirin or aspirin containing products at least fourteen (14) days prior to your surgical procedure and for at least one week (7 Days) after your surgical procedure. If you take aspirin on a regular basis for heart disease or history of stroke or for any other reason, we may recommend you continue taking aspirin but please notify us if you take this on a regular basis. Aspirin can cause more bleeding to occur during surgery as well as prolonged bleeding and bruising after surgery.   3. Avoid other nonsteroidal pain medications at least one week prior to surgery and at least one week prior to your surgery. These include medications such as Ibuprofen (Motrin, Advil and Nuprin), Naprosyn, Voltaren, Relafen, etc. If medications are used for therapeutic reasons, please inform us as they can cause increased bleeding or prolonged bleeding during and bruising after surgical procedures.   4. Please advice [place] if you are taking any "blood thinner" medications such as Coumadin or Dipyridamole or Plavix or similar medications. These cause increased bleeding and prolonged bleeding during and bruising after surgical procedures. We may have to consider discontinuing these medications briefly prior to and shortly after your surgery, if safe to do so.   5. Please inform us of all medications you are currently taking. All medications that are taken regularly should be taken the day of surgery as you always do. Nevertheless, we need to be informed of what medications you are taking prior to surgery to whether they will affect the  procedure or cause any complications.   6. Please inform us of any medication allergies. Also inform us of whether you have allergies to Latex or rubber products or whether you have had any adverse reaction to Lidocaine or Epinephrine.  7. Please inform us of any prosthetic or artificial body parts such as artificial heart valve, joint replacements, etc., or similar condition that might require preoperative antibiotics.   8. We recommend avoidance of alcohol at least two weeks prior to surgery and continued avoidence for at least two weeks after surgery.   9. We recommend discontinuation of tobacco smoking at least two weeks prior to surgery and continued abstinence for at least two weeks after surgery.  10. Do not plan strenuous exercise, strenuous work or strenuous lifting for approximately four weeks after your surgery.   11. We request if you are unable to make your scheduled surgical appointment, please call us at least a week in advance or as soon as you are aware of a problem sot aht we can cancel or reschedule you.   12. You MAKE TAKE TYLENOL (acetaminophen) for pain as it is not a blood thinner.   13. PLEASE PLAN TO BE IN TOWN FOR TWO WEEKS FOLLOWING SURGERY, THIS IS IMPORTANT SO YOU CAN BE CHECKED FOR DRESSING CHANGES, SUTURE REMOVAL AND TO MONITOR FOR POSSIBLE COMPLICATIONS.     Ketoconazole 2% cream - Apply to rash under breasts twice a day until improving, then decrease to once a day until clear. May use once a day for maintenance during hot months.  Hydrocortisone 2.5% cream - Apply to rash under breasts twice  a day until improving, then decrease to once a day until clear.

## 2020-04-23 ENCOUNTER — Telehealth: Payer: Self-pay

## 2020-04-23 NOTE — Telephone Encounter (Signed)
-----   Message from [name], MD sent at 04/23/2020 12:39 PM EDT ----- Skin , super pubic DERMATOFIBROMA, SEE DESCRIPTION  Benign growth, may recur

## 2020-04-23 NOTE — Telephone Encounter (Signed)
Advised pt of bx results/sh ?

## 2020-04-26 DIAGNOSIS — N3010 Interstitial cystitis (chronic) without hematuria: Secondary | ICD-10-CM | POA: Diagnosis not present

## 2020-04-26 DIAGNOSIS — N94819 Vulvodynia, unspecified: Secondary | ICD-10-CM | POA: Diagnosis not present

## 2020-04-26 DIAGNOSIS — G588 Other specified mononeuropathies: Secondary | ICD-10-CM | POA: Diagnosis not present

## 2020-04-26 DIAGNOSIS — R102 Pelvic and perineal pain: Secondary | ICD-10-CM | POA: Diagnosis not present

## 2020-05-21 ENCOUNTER — Encounter: Payer: Self-pay | Admitting: Internal Medicine

## 2020-05-21 ENCOUNTER — Other Ambulatory Visit: Payer: Self-pay

## 2020-05-21 ENCOUNTER — Ambulatory Visit (INDEPENDENT_AMBULATORY_CARE_PROVIDER_SITE_OTHER): Payer: BC Managed Care – PPO | Admitting: Internal Medicine

## 2020-05-21 VITALS — BP 108/72 | HR 96 | Temp 98.2°F | Ht 66.5 in | Wt 133.0 lb

## 2020-05-21 DIAGNOSIS — K5909 Other constipation: Secondary | ICD-10-CM

## 2020-05-21 DIAGNOSIS — K219 Gastro-esophageal reflux disease without esophagitis: Secondary | ICD-10-CM

## 2020-05-21 DIAGNOSIS — G43919 Migraine, unspecified, intractable, without status migrainosus: Secondary | ICD-10-CM

## 2020-05-21 DIAGNOSIS — F4321 Adjustment disorder with depressed mood: Secondary | ICD-10-CM | POA: Diagnosis not present

## 2020-05-21 DIAGNOSIS — G8929 Other chronic pain: Secondary | ICD-10-CM

## 2020-05-21 DIAGNOSIS — Z0000 Encounter for general adult medical examination without abnormal findings: Secondary | ICD-10-CM

## 2020-05-21 DIAGNOSIS — L700 Acne vulgaris: Secondary | ICD-10-CM | POA: Diagnosis not present

## 2020-05-21 DIAGNOSIS — R102 Pelvic and perineal pain: Secondary | ICD-10-CM

## 2020-05-21 HISTORY — DX: Acne vulgaris: L70.0

## 2020-05-21 NOTE — Assessment & Plan Note (Signed)
Continue Colace Encouraged high fiber diet, adequate water intake

## 2020-05-21 NOTE — Progress Notes (Signed)
Subjective:    Patient ID: Sonia Smith, female    DOB: [birth year], 39 y.o.   MRN: [id number]  HPI  Pt presents to the clinic today for her annual exam. She is also due to follow up chronic conditions.  Adjustment D/O with Depression: Improved since her divorce was finalized. She is not currently seeing a therapist. She denies anxiety, SI/HI.  GERD: Managed with Omeprazole OTC as needed with good relief of symptoms.  Migraines: These occur 1 x week. She does not take any medications on a regular basis for this. She does not follow with neurology.  Chronic Constipation: Managed with Colace daily. She is not following with GI.  Chronic Pelvic Pain: Managed with Gabapentin and Pyridium. She follows with urology.  Cystic Acne: Managed on Spironolactone. She follows with dermatology.  Flu: never Tetanus: 2016 Covid: never Pap Smear: 2019, [facility]: annually  Diet: She does eat some meat. She consumes fruits and veggies daily. She tries to avoid fried foods. She drinks mostly Sweet Tea. Exercise: None  Review of Systems      Past Medical History:  Diagnosis Date  . Blood in stool   . 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 Depression    in high school  . DVT (deep venous thrombosis) ([place])    in left arm, finishing eliquis on tomorrow 12/01/2017  . Dysplastic nevus 04/30/2007   L-4 web space. Slight to moderate atypia, extends to edge.  Marland Kitchen Dysrhythmia   . GERD (gastroesophageal reflux disease)   . Lupus ([place])    tested positive many years ago, most recent came back negative.  . Migraines   . Neuromuscular disorder ([place])    right pinkey and ring finger  . Painful intercourse   . Pudendal neuralgia   . Vaginal Pap smear, abnormal     Current Outpatient Medications  Medication Sig Dispense Refill  . acetaminophen (TYLENOL) 500 MG tablet Take 500 mg by mouth every 6 (six) hours as needed.    Marland Kitchen acyclovir (ZOVIRAX) 800 MG tablet TAKE 1 TABLET  BY MOUTH EVERY MORNING 90 tablet 2  . docusate sodium (COLACE) 250 MG capsule Take by mouth 3 (three) times daily.     Marland Kitchen gabapentin (NEURONTIN) 800 MG tablet Take 1,200 mg by mouth 3 (three) times daily.    . hydrocortisone 2.5 % cream Apply to rash under breasts 1-2 times daily as directed. 30 g 1  . ketoconazole (NIZORAL) 2 % cream Apply to rash under breasts 1-2 times daily as directed. 60 g 1  . lidocaine (XYLOCAINE) 5 % ointment APPLY TOPICALLY PRN (Patient not taking: Reported on 04/18/2020)  3  . nitrofurantoin (MACRODANTIN) 50 MG capsule   0  . phenazopyridine (PYRIDIUM) 200 MG tablet Take 1 tablet (200 mg total) by mouth 3 (three) times daily as needed for pain. 30 tablet 0   No current facility-administered medications for this visit.    Allergies  Allergen Reactions  . Polycarbophil Other (See Comments)    Redness, skin peeling  . Adhesive [Tape] Rash    From bandaides. Tape is OK.    Family History  Problem Relation Age of Onset  . Diabetes Mother   . Hypertension Mother   . Cancer Father        lung  . Diabetes Father   . Hypertension Father   . Heart disease Maternal Grandmother   . Heart disease Maternal Grandfather   . Cancer Maternal Grandfather   .  Cancer Paternal Grandfather   . Cancer Other   . Cancer Other   . Stroke Neg Hx     Social History   Socioeconomic History  . Marital status: Legally Separated    Spouse name: Not on file  . Number of children: Not on file  . Years of education: Not on file  . Highest education level: Not on file  Occupational History  . Not on file  Tobacco Use  . Smoking status: Former Smoker    Quit date: 10/08/1999    Years since quitting: 20.6  . Smokeless tobacco: Never Used  . Tobacco comment: quit 2001  Vaping Use  . Vaping Use: Never used  Substance and Sexual Activity  . Alcohol use: No    Alcohol/week: 0.0 standard drinks  . Drug use: No  . Sexual activity: Yes    Birth control/protection: Surgical    Other Topics Concern  . Not on file  Social History Narrative  . Not on file   Social Determinants of Health   Financial Resource Strain:   . Difficulty of Paying Living Expenses: Not on file  Food Insecurity:   . Worried About Charity fundraiser in the Last Year: Not on file  . Ran Out of Food in the Last Year: Not on file  Transportation Needs:   . Lack of Transportation (Medical): Not on file  . Lack of Transportation (Non-Medical): Not on file  Physical Activity:   . Days of Exercise per Week: Not on file  . Minutes of Exercise per Session: Not on file  Stress:   . Feeling of Stress : Not on file  Social Connections:   . Frequency of Communication with Friends and Family: Not on file  . Frequency of Social Gatherings with Friends and Family: Not on file  . Attends Religious Services: Not on file  . Active Member of Clubs or Organizations: Not on file  . Attends Archivist Meetings: Not on file  . Marital Status: Not on file  Intimate Partner Violence:   . Fear of Current or Ex-Partner: Not on file  . Emotionally Abused: Not on file  . Physically Abused: Not on file  . Sexually Abused: Not on file     Constitutional: Pt reports intermittent headaches. Denies fever, malaise, fatigue, or abrupt weight changes.  HEENT: Denies eye pain, eye redness, ear pain, ringing in the ears, wax buildup, runny nose, nasal congestion, bloody nose, or sore throat. Respiratory: Denies difficulty breathing, shortness of breath, cough or sputum production.   Cardiovascular: Denies chest pain, chest tightness, palpitations or swelling in the hands or feet.  Gastrointestinal: Pt reports chronic constipation. Denies abdominal pain, bloating, diarrhea or blood in the stool.  GU: Pt reports chronic pelvic pain. Denies urgency, frequency, pain with urination, burning sensation, blood in urine, odor or discharge. Musculoskeletal: Denies decrease in range of motion, difficulty with gait,  muscle pain or joint pain and swelling.  Skin: Denies redness, rashes, lesions or ulcercations.  Neurological: Denies dizziness, difficulty with memory, difficulty with speech or problems with balance and coordination.  Psych: Pt has a history of depression. Denies anxiety, SI/HI.  No other specific complaints in a complete review of systems (except as listed in HPI above).  Objective:   Physical Exam   BP 108/72   Pulse 96   Temp 98.2 F (36.8 C) (Temporal)   Ht 5' 6.5" (1.689 m)   Wt 133 lb (60.3 kg)   SpO2 99%  BMI 21.15 kg/m   Wt Readings from Last 3 Encounters:  03/07/19 149 lb (67.6 kg)  08/25/18 158 lb (71.7 kg)  05/11/18 160 lb (72.6 kg)    General: Appears her stated age, well developed, well nourished in NAD. Skin: Warm, dry and intact. No rashes noted. HEENT: Head: normal shape and size; Eyes: sclera white, no icterus, conjunctiva pink, PERRLA and EOMs intact;  Neck:  Neck supple, trachea midline. No masses, lumps or thyromegaly present.  Cardiovascular: Tachycardic with normal rhythm. S1,S2 noted.  No murmur, rubs or gallops noted. No JVD or BLE edema.  Pulmonary/Chest: Normal effort and positive vesicular breath sounds. No respiratory distress. No wheezes, rales or ronchi noted.  Abdomen: Soft and nontender. Normal bowel sounds. No distention or masses noted. Liver, spleen and kidneys non palpable. Musculoskeletal: Strength 5/5 BUE/BLE. No difficulty with gait.  Neurological: Alert and oriented. Cranial nerves II-XII grossly intact. Coordination normal.  Psychiatric: Mood and affect normal. Behavior is normal. Judgment and thought content normal.     BMET    Component Value Date/Time   NA 141 03/07/2019 1050   K 4.5 03/07/2019 1050   CL 104 03/07/2019 1050   CO2 31 03/07/2019 1050   GLUCOSE 95 03/07/2019 1050   BUN 8 03/07/2019 1050   CREATININE 0.92 03/07/2019 1050   CALCIUM 9.7 03/07/2019 1050   GFRNONAA >60 08/19/2015 0158   GFRAA >60 08/19/2015  0158    Lipid Panel     Component Value Date/Time   CHOL 186 02/22/2018 1608   TRIG 59.0 02/22/2018 1608   HDL 37.70 (L) 02/22/2018 1608   CHOLHDL 5 02/22/2018 1608   VLDL 11.8 02/22/2018 1608   LDLCALC 136 (H) 02/22/2018 1608    CBC    Component Value Date/Time   WBC 7.3 03/07/2019 1050   RBC 4.68 03/07/2019 1050   HGB 15.0 03/07/2019 1050   HCT 44.8 03/07/2019 1050   PLT 280.0 03/07/2019 1050   MCV 95.6 03/07/2019 1050   MCH 30.6 08/19/2015 0158   MCHC 33.5 03/07/2019 1050   RDW 12.4 03/07/2019 1050    Hgb A1C No results found for: HGBA1C         Assessment & Plan:  Preventative Health Maintenance:  She declines flu shot today Tetanus UTD She declines Covid vaccine at this time Pap smear UTD  Encouraged her to consume a balanced diet and exercise regimen Advised her to see a dentist annually Will check CBC, CMET, Lipid profile today  RTC in 1 year, sooner if needed  Webb Silversmith, NP This visit occurred during the SARS-CoV-2 public health emergency.  Safety protocols were in place, including screening questions prior to the visit, additional usage of staff PPE, and extensive cleaning of exam room while observing appropriate contact time as indicated for disinfecting solutions.

## 2020-05-21 NOTE — Assessment & Plan Note (Signed)
Continue Gabapentin and Pyridium She will continue to follow with urology

## 2020-05-21 NOTE — Assessment & Plan Note (Signed)
Continue Prilosec OTC as needed

## 2020-05-21 NOTE — Patient Instructions (Signed)
Health Maintenance, Female Adopting a healthy lifestyle and getting preventive care are important in promoting health and wellness. Ask your health care provider about:  The right schedule for you to have regular tests and exams.  Things you can do on your own to prevent diseases and keep yourself healthy. What should I know about diet, weight, and exercise? Eat a healthy diet   Eat a diet that includes plenty of vegetables, fruits, low-fat dairy products, and lean protein.  Do not eat a lot of foods that are high in solid fats, added sugars, or sodium. Maintain a healthy weight Body mass index (BMI) is used to identify weight problems. It estimates body fat based on height and weight. Your health care provider can help determine your BMI and help you achieve or maintain a healthy weight. Get regular exercise Get regular exercise. This is one of the most important things you can do for your health. Most adults should:  Exercise for at least 150 minutes each week. The exercise should increase your heart rate and make you sweat (moderate-intensity exercise).  Do strengthening exercises at least twice a week. This is in addition to the moderate-intensity exercise.  Spend less time sitting. Even light physical activity can be beneficial. Watch cholesterol and blood lipids Have your blood tested for lipids and cholesterol at 39 years of age, then have this test every 5 years. Have your cholesterol levels checked more often if:  Your lipid or cholesterol levels are high.  You are older than 40 years of age.  You are at high risk for heart disease. What should I know about cancer screening? Depending on your health history and family history, you may need to have cancer screening at various ages. This may include screening for:  Breast cancer.  Cervical cancer.  Colorectal cancer.  Skin cancer.  Lung cancer. What should I know about heart disease, diabetes, and high blood  pressure? Blood pressure and heart disease  High blood pressure causes heart disease and increases the risk of stroke. This is more likely to develop in people who have high blood pressure readings, are of African descent, or are overweight.  Have your blood pressure checked: ? Every 3-5 years if you are 18-39 years of age. ? Every year if you are 40 years old or older. Diabetes Have regular diabetes screenings. This checks your fasting blood sugar level. Have the screening done:  Once every three years after age 40 if you are at a normal weight and have a low risk for diabetes.  More often and at a younger age if you are overweight or have a high risk for diabetes. What should I know about preventing infection? Hepatitis B If you have a higher risk for hepatitis B, you should be screened for this virus. Talk with your health care provider to find out if you are at risk for hepatitis B infection. Hepatitis C Testing is recommended for:  Everyone born from 1945 through 1965.  Anyone with known risk factors for hepatitis C. Sexually transmitted infections (STIs)  Get screened for STIs, including gonorrhea and chlamydia, if: ? You are sexually active and are younger than 39 years of age. ? You are older than 39 years of age and your health care provider tells you that you are at risk for this type of infection. ? Your sexual activity has changed since you were last screened, and you are at increased risk for chlamydia or gonorrhea. Ask your health care provider if   you are at risk.  Ask your health care provider about whether you are at high risk for HIV. Your health care provider may recommend a prescription medicine to help prevent HIV infection. If you choose to take medicine to prevent HIV, you should first get tested for HIV. You should then be tested every 3 months for as long as you are taking the medicine. Pregnancy  If you are about to stop having your period (premenopausal) and  you may become pregnant, seek counseling before you get pregnant.  Take 400 to 800 micrograms (mcg) of folic acid every day if you become pregnant.  Ask for birth control (contraception) if you want to prevent pregnancy. Osteoporosis and menopause Osteoporosis is a disease in which the bones lose minerals and strength with aging. This can result in bone fractures. If you are 65 years old or older, or if you are at risk for osteoporosis and fractures, ask your health care provider if you should:  Be screened for bone loss.  Take a calcium or vitamin D supplement to lower your risk of fractures.  Be given hormone replacement therapy (HRT) to treat symptoms of menopause. Follow these instructions at home: Lifestyle  Do not use any products that contain nicotine or tobacco, such as cigarettes, e-cigarettes, and chewing tobacco. If you need help quitting, ask your health care provider.  Do not use street drugs.  Do not share needles.  Ask your health care provider for help if you need support or information about quitting drugs. Alcohol use  Do not drink alcohol if: ? Your health care provider tells you not to drink. ? You are pregnant, may be pregnant, or are planning to become pregnant.  If you drink alcohol: ? Limit how much you use to 0-1 drink a day. ? Limit intake if you are breastfeeding.  Be aware of how much alcohol is in your drink. In the [place] one drink equals one 12 oz bottle of beer (355 mL), one 5 oz glass of wine (148 mL), or one 1 oz glass of hard liquor (44 mL). General instructions  Schedule regular health, dental, and eye exams.  Stay current with your vaccines.  Tell your health care provider if: ? You often feel depressed. ? You have ever been abused or do not feel safe at home. Summary  Adopting a healthy lifestyle and getting preventive care are important in promoting health and wellness.  Follow your health care provider's instructions about healthy  diet, exercising, and getting tested or screened for diseases.  Follow your health care provider's instructions on monitoring your cholesterol and blood pressure. This information is not intended to replace advice given to you by your health care provider. Make sure you discuss any questions you have with your health care provider. Document Revised: 07/14/2018 Document Reviewed: 07/14/2018 Elsevier Patient Education  2020 Elsevier Inc.  

## 2020-05-21 NOTE — Assessment & Plan Note (Signed)
Continue Spironolactone CMET today

## 2020-05-21 NOTE — Assessment & Plan Note (Signed)
Stable off meds °Will monitor °

## 2020-05-21 NOTE — Assessment & Plan Note (Signed)
Intermittent Continue Tylenol or Ibuprofen as needed

## 2020-05-22 LAB — LIPID PANEL
Cholesterol: 183 mg/dL (ref 0–200)
HDL: 39.2 mg/dL (ref >39.00)
LDL Cholesterol: 129 mg/dL — ABNORMAL HIGH (ref 0–99)
NonHDL: 143.98
Total CHOL/HDL Ratio: 5
Triglycerides: 77 mg/dL (ref 0.0–149.0)
VLDL: 15.4 mg/dL (ref 0.0–40.0)

## 2020-05-22 LAB — COMPREHENSIVE METABOLIC PANEL
ALT: 9 U/L (ref 0–35)
AST: 14 U/L (ref 0–37)
Albumin: 4.5 g/dL (ref 3.5–5.2)
Alkaline Phosphatase: 58 U/L (ref 39–117)
BUN: 9 mg/dL (ref 6–23)
CO2: 29 mEq/L (ref 19–32)
Calcium: 9.6 mg/dL (ref 8.4–10.5)
Chloride: 99 mEq/L (ref 96–112)
Creatinine, Ser: 1.01 mg/dL (ref 0.40–1.20)
GFR: 69.93 mL/min (ref >60.00)
Glucose, Bld: 71 mg/dL (ref 70–99)
Potassium: 4.9 mEq/L (ref 3.5–5.1)
Sodium: 135 mEq/L (ref 135–145)
Total Bilirubin: 0.8 mg/dL (ref 0.2–1.2)
Total Protein: 7 g/dL (ref 6.0–8.3)

## 2020-05-22 LAB — CBC
HCT: 44.3 % (ref 36.0–46.0)
Hemoglobin: 15 g/dL (ref 12.0–15.0)
MCHC: 33.9 g/dL (ref 30.0–36.0)
MCV: 95.5 fl (ref 78.0–100.0)
Platelets: 237 10*3/uL (ref 150.0–400.0)
RBC: 4.64 Mil/uL (ref 3.87–5.11)
RDW: 12.4 % (ref 11.5–15.5)
WBC: 8.2 10*3/uL (ref 4.0–10.5)

## 2020-06-11 ENCOUNTER — Other Ambulatory Visit: Payer: Self-pay

## 2020-06-11 ENCOUNTER — Ambulatory Visit: Payer: BC Managed Care – PPO | Admitting: Dermatology

## 2020-06-11 DIAGNOSIS — D179 Benign lipomatous neoplasm, unspecified: Secondary | ICD-10-CM

## 2020-06-11 NOTE — Progress Notes (Signed)
   Follow-Up Visit   Subjective  Sonia Smith is a 39 y.o. female who presents for the following: Lipoma (right anterior shoulder, remove today). It is tender to touch.  The following portions of the chart were reviewed this encounter and updated as appropriate:      Review of Systems:  No other skin or systemic complaints except as noted in HPI or Assessment and Plan.  Objective  Well appearing patient in no apparent distress; mood and affect are within normal limits.  A focused examination was performed including right shoulder. Relevant physical exam findings are noted in the Assessment and Plan.  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  Pt is aware and wants it removed since symptomatic.  Will send referral to general surgeon for evaluation and treatment. Refer to Dr. Fleet Contras  Return if symptoms worsen or fail to improve.   IJamesetta Smith, CMA, am acting as scribe for [name], MD .  Documentation: I have reviewed the above documentation for accuracy and completeness, and I agree with the above.  [name] MD

## 2020-06-19 ENCOUNTER — Telehealth: Payer: Self-pay

## 2020-06-19 NOTE — Telephone Encounter (Signed)
Left message for patient regarding referral to Dr Bary Castilla or Dr Peyton Najjar, as they have tried to contact patient to schedule without success. Patient to call back and advise [place] if she still wants appointment to be scheduled.

## 2020-08-23 ENCOUNTER — Ambulatory Visit: Payer: BC Managed Care – PPO

## 2020-08-27 ENCOUNTER — Ambulatory Visit: Payer: BC Managed Care – PPO | Admitting: Advanced Practice Midwife

## 2020-08-27 ENCOUNTER — Encounter: Payer: Self-pay | Admitting: Advanced Practice Midwife

## 2020-08-27 ENCOUNTER — Other Ambulatory Visit: Payer: Self-pay

## 2020-08-27 DIAGNOSIS — Z113 Encounter for screening for infections with a predominantly sexual mode of transmission: Secondary | ICD-10-CM

## 2020-08-27 DIAGNOSIS — G8929 Other chronic pain: Secondary | ICD-10-CM

## 2020-08-27 DIAGNOSIS — F339 Major depressive disorder, recurrent, unspecified: Secondary | ICD-10-CM

## 2020-08-27 DIAGNOSIS — F419 Anxiety disorder, unspecified: Secondary | ICD-10-CM

## 2020-08-27 DIAGNOSIS — F4310 Post-traumatic stress disorder, unspecified: Secondary | ICD-10-CM | POA: Insufficient documentation

## 2020-08-27 DIAGNOSIS — Z8659 Personal history of other mental and behavioral disorders: Secondary | ICD-10-CM | POA: Insufficient documentation

## 2020-08-27 DIAGNOSIS — Z90710 Acquired absence of both cervix and uterus: Secondary | ICD-10-CM | POA: Insufficient documentation

## 2020-08-27 DIAGNOSIS — R102 Pelvic and perineal pain: Secondary | ICD-10-CM

## 2020-08-27 HISTORY — DX: Major depressive disorder, recurrent, unspecified: F33.9

## 2020-08-27 HISTORY — DX: Personal history of other mental and behavioral disorders: Z86.59

## 2020-08-27 HISTORY — DX: Post-traumatic stress disorder, unspecified: F43.10

## 2020-08-27 HISTORY — DX: Anxiety disorder, unspecified: F41.9

## 2020-08-27 HISTORY — DX: Acquired absence of both cervix and uterus: Z90.710

## 2020-08-27 LAB — WET PREP FOR TRICH, YEAST, CLUE
Trichomonas Exam: NEGATIVE
Yeast Exam: NEGATIVE

## 2020-08-27 NOTE — Progress Notes (Signed)
[facility] Department STI clinic/screening visit  Subjective:  Sonia Smith is a 40 y.o. DWF vaper G3P2  female being seen today for an STI screening visit. The patient reports they do have symptoms.  Patient reports that they do not desire a pregnancy in the next year.   They reported they are not interested in discussing contraception today.  No LMP recorded. Patient has had a hysterectomy.   Patient has the following medical conditions:   Patient Active Problem List   Diagnosis Date Noted  . H/O: hysterectomy 11/2006 08/27/2020  . History of admission to inpatient psychiatry department 08/2017 08/27/2020  . PTSD (post-traumatic stress disorder) dx'd 2019 08/27/2020  . Anxiety dx'd 2019 08/27/2020  . Depression, recurrent ([place]) dx'd 2019 08/27/2020  . Cystic acne 05/21/2020  . Hemorrhoids 06/11/2016  . Chronic constipation 06/11/2016  . Adjustment disorder with depressed mood   . Migraines 09/04/2014  . GERD (gastroesophageal reflux disease) 09/04/2014  . Chronic pelvic pain in female 09/04/2014    No chief complaint on file.   HPI  Patient reports "not right since I last had unprotected sex 06/2020 without a condom with a partner for the first time". "Urethral damage" with childbirth and "my urethra has  Been hurting since 06/2020".  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  Hysterectomy 11/2006 with ovaries intact.  Last MJ 1998.  Last ETOH 08/2017 (1+ bottle wine)--rarely.  Last cig 2001.  Current vaper.  Last cigar age 68.   Last HIV test per patient/review of record was 08/25/2018 Patient reports last pap was 05/16/2016 neg  See flowsheet for further details and programmatic requirements.    The following portions of the patient's history were reviewed and updated as appropriate: allergies, current medications, past medical history, past social history, past surgical history and problem list.  Objective:  There were no vitals  filed for this visit.  Physical Exam Vitals and nursing note reviewed.  Constitutional:      Appearance: Normal appearance. She is normal weight.  HENT:     Head: Normocephalic and atraumatic.     Mouth/Throat:     Mouth: Mucous membranes are moist.     Pharynx: Oropharynx is clear. No oropharyngeal exudate or posterior oropharyngeal erythema.  Eyes:     Conjunctiva/sclera: Conjunctivae normal.  Pulmonary:     Effort: Pulmonary effort is normal.  Chest:  Breasts:     Right: No axillary adenopathy or supraclavicular adenopathy.     Left: No axillary adenopathy or supraclavicular adenopathy.    Abdominal:     Palpations: Abdomen is soft. There is no mass.     Tenderness: There is no abdominal tenderness. There is no rebound.     Comments: Tattoos present, soft without masses or tenderness, fair tone  Genitourinary:    General: Normal vulva.     Exam position: Lithotomy position.     Pubic Area: No rash or pubic lice.      Labia:        Right: No rash or lesion.        Left: No rash or lesion.      Vagina: Vaginal discharge (white creamy leukorrhea, ph<4.5) present. No erythema, bleeding or lesions.     Cervix: No cervical motion tenderness, discharge, friability, lesion or erythema.     Uterus: Normal.      Adnexa: Right adnexa normal and left adnexa normal.     Rectum: Normal.     Comments: Urethra wnl; cervix  and uterus surgically absent Lymphadenopathy:     Head:     Right side of head: No preauricular or posterior auricular adenopathy.     Left side of head: No preauricular or posterior auricular adenopathy.     Cervical: No cervical adenopathy.     Upper Body:     Right upper body: No supraclavicular or axillary adenopathy.     Left upper body: No supraclavicular or axillary adenopathy.     Lower Body: No right inguinal adenopathy. No left inguinal adenopathy.  Skin:    General: Skin is warm and dry.     Findings: No rash.  Neurological:     Mental Status: She is  alert and oriented to person, place, and time.      Assessment and Plan:  [name] is a 40 y.o. female presenting to the [facility] Department for STI screening  1. Screening examination for venereal disease Treat wet mount per standing orders Immunization nurse consult - WET PREP FOR [place], YEAST, CLUE - Chlamydia/Gonorrhea [facility] - Syphilis Serology, [facility] - HIV/HCV [facility]  2. Chronic pelvic pain in female   3. H/O: hysterectomy 11/2006   4. History of admission to inpatient psychiatry department 08/2017   5. PTSD (post-traumatic stress disorder) dx'd 2019  6. Anxiety dx'd 2019   7. 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  No future appointments.  Herbie Saxon, CNM

## 2020-08-27 NOTE — Progress Notes (Signed)
Wet mount reviewed, no tx per standing order. Provider orders completed. 

## 2020-09-04 LAB — HM HIV SCREENING LAB: HM HIV Screening: NEGATIVE

## 2020-09-04 LAB — HM HEPATITIS C SCREENING LAB: HM Hepatitis Screen: NEGATIVE

## 2020-09-05 ENCOUNTER — Telehealth (INDEPENDENT_AMBULATORY_CARE_PROVIDER_SITE_OTHER): Payer: BC Managed Care – PPO | Admitting: Primary Care

## 2020-09-05 ENCOUNTER — Encounter: Payer: Self-pay | Admitting: Primary Care

## 2020-09-05 ENCOUNTER — Other Ambulatory Visit: Payer: Self-pay

## 2020-09-05 VITALS — Temp 98.6°F | Ht 66.5 in | Wt 131.0 lb

## 2020-09-05 DIAGNOSIS — J019 Acute sinusitis, unspecified: Secondary | ICD-10-CM | POA: Insufficient documentation

## 2020-09-05 HISTORY — DX: Acute sinusitis, unspecified: J01.90

## 2020-09-05 MED ORDER — AZITHROMYCIN 250 MG PO TABS: ORAL_TABLET | ORAL | 0 refills | Status: DC

## 2020-09-05 NOTE — Patient Instructions (Signed)
Wait another 1-2 days, if no improvement then start the antibiotics.  Start Azithromycin antibiotics for infection. Take 2 tablets by mouth today, then 1 tablet daily for 4 additional days.  Continue Flonase.  Please update [place] if no improvement.  It was a pleasure meeting you! Allie Bossier, NP-C

## 2020-09-05 NOTE — Assessment & Plan Note (Signed)
Acute viral/bacterial sinus symptoms x 7 days, no significant improvement. Discussed options, since "cold symptoms" are improving, recommended she wait 1-2 more days, if no improvement then start antibiotics. Rx for Zpak sent to pharmacy, historically this works well for her.  She will update if no improvement as symptoms could be viral such as Covid-19. She agrees.

## 2020-09-05 NOTE — Progress Notes (Signed)
Subjective:    Patient ID: Sonia Smith, female    DOB: 20-Feb-1981, 40 y.o.   MRN: [id number]  HPI  Virtual Visit via Video Note  I connected with Sonia Smith on 09/05/20 at 10:20 AM EST by a video enabled telemedicine application and verified that I am speaking with the correct person using two identifiers.  Location: Patient: Home Provider: Office Participants: Patient and myself   I discussed the limitations of evaluation and management by telemedicine and the availability of in person appointments. The patient expressed understanding and agreed to proceed.  History of Present Illness:  Sonia Smith is a 40 year old female patient of [facility] with a history of migraines, GERD, allergic rhinitis, sinusitis, anxiety, depression who presents today with a chief compliant of sinus pressure.  Symptoms began one week ago with maxillary sinus pressure and nasal congestion. She then began to experience moderate upper dental pain. She did experience a short lived fever four days ago which ran 99.5-101. She began to cough yesterday. She is not vaccinated from Covid-19, has not tested recently.   She had Covid-19 in early 2020. Prior to Covid-19 she would experience sinusitis infections twice annually, she's had no sinusitis infections since. She denies known exposure to Covid-19, stays home mostly. She's been taking Flonase and Ibuprofen with temporary improvement. Today she's feeling about the same, her "cold symptoms" have improved, but she continues to experience dental pain and is expelling a copious amount of green mucous from her nasal passages.   She denies loss of taste/smell, diarrhea.    Observations/Objective:  Alert and oriented. Appears well, not sickly. No distress. Speaking in complete sentences. No cough.  Assessment and Plan:  Acute viral/bacterial sinus symptoms x 7 days, no significant improvement. Discussed options, since "cold symptoms" are improving,  recommended she wait 1-2 more days, if no improvement then start antibiotics. Rx for Zpak sent to pharmacy, historically this works well for her.  She will update if no improvement as symptoms could be viral such as Covid-19. She agrees.  Follow Up Instructions:  Wait another 1-2 days, if no improvement then start the antibiotics.  Start Azithromycin antibiotics for infection. Take 2 tablets by mouth today, then 1 tablet daily for 4 additional days.  Continue Flonase.  Please update [place] if no improvement.  It was a pleasure meeting you! Allie Bossier, NP-C    I discussed the assessment and treatment plan with the patient. The patient was provided an opportunity to ask questions and all were answered. The patient agreed with the plan and demonstrated an understanding of the instructions.   The patient was advised to call back or seek an in-person evaluation if the symptoms worsen or if the condition fails to improve as anticipated.    Pleas Koch, NP    Review of Systems  Constitutional: Positive for fever. Negative for chills and fatigue.  HENT: Positive for congestion, rhinorrhea, sinus pressure and sinus pain.   Respiratory: Positive for cough. Negative for shortness of breath.   Gastrointestinal: Negative for diarrhea.  Neurological: Positive for headaches.       Past Medical History:  Diagnosis Date  . Blood in stool   . 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 Depression    in high school  . DVT (deep venous thrombosis) ([place])    in left arm, finishing eliquis on tomorrow 12/01/2017  . Dysplastic nevus 04/30/2007   L-4 web space. Slight to moderate  atypia, extends to edge.  Marland Kitchen Dysrhythmia   . GERD (gastroesophageal reflux disease)   . Lupus ([place])    tested positive many years ago, most recent came back negative.  . Migraines   . Neuromuscular disorder ([place])    right pinkey and ring finger  . Painful intercourse   . Pudendal neuralgia   . Vaginal Pap smear,  abnormal      Social History   Socioeconomic History  . Marital status: Divorced    Spouse name: Not on file  . Number of children: Not on file  . Years of education: Not on file  . Highest education level: Not on file  Occupational History  . Not on file  Tobacco Use  . Smoking status: Former Smoker    Types: Cigarettes, E-cigarettes, Cigars    Quit date: 10/08/1999    Years since quitting: 20.9  . Smokeless tobacco: Never Used  . Tobacco comment: quit 2001; last vaped 08/25/20  Vaping Use  . Vaping Use: Never used  Substance and Sexual Activity  . Alcohol use: Yes    Alcohol/week: 15.0 standard drinks    Types: 15 Glasses of wine per week    Comment: 1+ bottle wine 08/2017  . Drug use: Not Currently    Types: Marijuana    Comment: last use 1998  . Sexual activity: Yes    Partners: Male    Birth control/protection: Surgical  Other Topics Concern  . Not on file  Social History Narrative  . Not on file   Social Determinants of Health   Financial Resource Strain: Not on file  Food Insecurity: Not on file  Transportation Needs: Not on file  Physical Activity: Not on file  Stress: Not on file  Social Connections: Not on file  Intimate Partner Violence: Not on file    Past Surgical History:  Procedure Laterality Date  . ABDOMINAL HYSTERECTOMY  2008  . 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 CERVICAL CERCLAGE  2001, 2004  . HEMORRHOID SURGERY N/A 11/06/2015   Procedure: HEMORRHOIDECTOMY;  Surgeon: Leonie Green, MD;  Location: [facility] ORS;  Service: General;  Laterality: N/A;  . LEEP  1998   dysplasia    Family History  Problem Relation Age of Onset  . Diabetes Mother   . Hypertension Mother   . Cancer Father        lung  . Diabetes Father   . Hypertension Father   . Heart disease Maternal Grandmother   . Heart disease Maternal Grandfather   . Cancer Maternal Grandfather   . Cancer Paternal Grandfather   . Cancer Other   .  Cancer Other   . Stroke Neg Hx     Allergies  Allergen Reactions  . Polycarbophil Other (See Comments)    Redness, skin peeling  . Adhesive [Tape] Rash    From bandaides. Tape is OK.    Current Outpatient Medications on File Prior to Visit  Medication Sig Dispense Refill  . acetaminophen (TYLENOL) 500 MG tablet Take 500 mg by mouth every 6 (six) hours as needed.    Marland Kitchen acyclovir (ZOVIRAX) 800 MG tablet TAKE 1 TABLET BY MOUTH EVERY MORNING 90 tablet 2  . docusate sodium (COLACE) 250 MG capsule Take by mouth 3 (three) times daily.     Marland Kitchen gabapentin (NEURONTIN) 800 MG tablet Take 1,200 mg by mouth 3 (three) times daily.    Marland Kitchen ketoconazole (NIZORAL) 2 % cream Apply to  rash under breasts 1-2 times daily as directed. 60 g 1  . nitrofurantoin (MACRODANTIN) 50 MG capsule As needed  0  . spironolactone (ALDACTONE) 50 MG tablet Take 50 mg by mouth daily.     No current facility-administered medications on file prior to visit.    Temp 98.6 F (37 C) (Oral)   Ht 5' 6.5" (1.689 m)   Wt 131 lb (59.4 kg)   BMI 20.83 kg/m    Objective:   Physical Exam Constitutional:      Appearance: She is not ill-appearing.     Comments: Appears tired  Pulmonary:     Effort: Pulmonary effort is normal.     Comments: No cough during visit Neurological:     Mental Status: She is alert and oriented to person, place, and time.  Psychiatric:        Mood and Affect: Mood normal.            Assessment & Plan:

## 2020-09-05 NOTE — Progress Notes (Signed)
Patient has has a lot of sinus pressure, teeth pain, bilateral ear pain and a slight cough since last Wednesday. Patient states she had a low grade fever Saturday only. She took an at home covid test on Sunday and was negative.

## 2021-05-04 ENCOUNTER — Other Ambulatory Visit: Payer: Self-pay

## 2021-05-04 ENCOUNTER — Encounter: Payer: Self-pay | Admitting: Emergency Medicine

## 2021-05-04 ENCOUNTER — Emergency Department: Payer: Medicaid Other

## 2021-05-04 ENCOUNTER — Emergency Department
Admission: EM | Admit: 2021-05-04 | Discharge: 2021-05-04 | Disposition: A | Discharge status: Home / Self Care | Payer: Medicaid Other | Attending: Emergency Medicine | Admitting: Emergency Medicine

## 2021-05-04 DIAGNOSIS — R0602 Shortness of breath: Secondary | ICD-10-CM | POA: Insufficient documentation

## 2021-05-04 DIAGNOSIS — G479 Sleep disorder, unspecified: Secondary | ICD-10-CM | POA: Insufficient documentation

## 2021-05-04 DIAGNOSIS — R079 Chest pain, unspecified: Secondary | ICD-10-CM | POA: Insufficient documentation

## 2021-05-04 DIAGNOSIS — E876 Hypokalemia: Secondary | ICD-10-CM | POA: Insufficient documentation

## 2021-05-04 DIAGNOSIS — J45909 Unspecified asthma, uncomplicated: Secondary | ICD-10-CM | POA: Insufficient documentation

## 2021-05-04 DIAGNOSIS — Z87891 Personal history of nicotine dependence: Secondary | ICD-10-CM | POA: Insufficient documentation

## 2021-05-04 LAB — BASIC METABOLIC PANEL
Anion gap: 9 (ref 5–15)
BUN: 10 mg/dL (ref 6–20)
CO2: 27 mmol/L (ref 22–32)
Calcium: 9.1 mg/dL (ref 8.9–10.3)
Chloride: 103 mmol/L (ref 98–111)
Creatinine, Ser: 0.88 mg/dL (ref 0.44–1.00)
GFR, Estimated: >60 mL/min (ref >60)
Glucose, Bld: 168 mg/dL — ABNORMAL HIGH (ref 70–99)
Potassium: 2.9 mmol/L — ABNORMAL LOW (ref 3.5–5.1)
Sodium: 139 mmol/L (ref 135–145)

## 2021-05-04 LAB — CBC
HCT: 40.2 % (ref 36.0–46.0)
Hemoglobin: 13.9 g/dL (ref 12.0–15.0)
MCH: 32.6 pg (ref 26.0–34.0)
MCHC: 34.6 g/dL (ref 30.0–36.0)
MCV: 94.4 fL (ref 80.0–100.0)
Platelets: 246 10*3/uL (ref 150–400)
RBC: 4.26 MIL/uL (ref 3.87–5.11)
RDW: 12 % (ref 11.5–15.5)
WBC: 8.5 10*3/uL (ref 4.0–10.5)
nRBC: 0 % (ref 0.0–0.2)

## 2021-05-04 LAB — TSH: TSH: 2.234 u[IU]/mL (ref 0.350–4.500)

## 2021-05-04 LAB — TROPONIN I (HIGH SENSITIVITY): Troponin I (High Sensitivity): 2 ng/L (ref <18)

## 2021-05-04 LAB — LIPASE, BLOOD: Lipase: 34 U/L (ref 11–51)

## 2021-05-04 LAB — T4, FREE: Free T4: 1.77 ng/dL — ABNORMAL HIGH (ref 0.61–1.12)

## 2021-05-04 LAB — HEPATIC FUNCTION PANEL
ALT: 12 U/L (ref 0–44)
AST: 14 U/L — ABNORMAL LOW (ref 15–41)
Albumin: 3.8 g/dL (ref 3.5–5.0)
Alkaline Phosphatase: 36 U/L — ABNORMAL LOW (ref 38–126)
Bilirubin, Direct: <0.1 mg/dL (ref 0.0–0.2)
Total Bilirubin: 0.5 mg/dL (ref 0.3–1.2)
Total Protein: 6.2 g/dL — ABNORMAL LOW (ref 6.5–8.1)

## 2021-05-04 MED ORDER — POTASSIUM CHLORIDE CRYS ER 20 MEQ PO TBCR
40.0000 meq | EXTENDED_RELEASE_TABLET | Freq: Once | ORAL | Status: AC
  Administered 2021-05-04: 40 meq via ORAL
  Filled 2021-05-04: qty 2

## 2021-05-04 MED ORDER — POTASSIUM CHLORIDE CRYS ER 20 MEQ PO TBCR: 20.0000 meq | EXTENDED_RELEASE_TABLET | Freq: Two times a day (BID) | ORAL | 0 refills | Status: DC

## 2021-05-04 NOTE — ED Notes (Signed)
Per EMS pt took 2 weeds gummies, reports tachycardia  Pt from home per EMS 117/81 97% RA  Burning sensation on chest

## 2021-05-04 NOTE — ED Triage Notes (Signed)
Pt presents to ER from home via EMS with complaints of burning sensation to chest. Pt reports she was recommended by a friend to take some weed gummies to help her with sleep. Pt reports she tried to go to sleep and started to feel her heart racing and having burning sensation. Pt talks in complete sentences no distress noted

## 2021-05-04 NOTE — Discharge Instructions (Addendum)
Your potassium was low at 2.9.  Please take the potassium supplementation for the next 5 days twice daily.  You should have this blood work rechecked in about 1 week.  Your thyroid function tests were little bit abnormal as well, please follow-up with your primary care provider for reevaluation.

## 2021-05-04 NOTE — ED Provider Notes (Signed)
[facility]  ____________________________________________   Event Date/Time   First MD Initiated Contact with Patient 05/04/21 [phone]     (approximate)  I have reviewed the triage vital signs and the nursing notes.   HISTORY  Chief Complaint Chest Pain    HPI Sonia Smith is a 40 y.o. female with past medical history of provoked DVT in the left arm, no longer on anticoagulation, depression, anxiety, history of lupus, GERD who presents with chest pain.  Patient has had difficulty sleeping.  A friend told her to take 2 THC Gummies to help with sleep.  She did this and was falling asleep in bed when she suddenly felt sharp pain in the center of her chest that then spread across the chest into a burning type pain.  She then felt extremely anxious and short of breath.  Felt like she could not feel her extremities.  This lasted for about 15 minutes and she called EMS.  Currently she feels back to baseline.  No longer anxious or short of breath.  Does have some aching in the center of her chest but is not severe like it was.  She denies fevers, chills abdominal pain, nausea vomiting.  Denies lower extremity swelling.  She has had a hysterectomy.         Past Medical History:  Diagnosis Date   Blood in stool    Childhood asthma    pt has out grown   Depression    in high school   DVT (deep venous thrombosis) ([place])    in left arm, finishing eliquis on tomorrow 12/01/2017   Dysplastic nevus 04/30/2007   L-4 web space. Slight to moderate atypia, extends to edge.   Dysrhythmia    GERD (gastroesophageal reflux disease)    Lupus ([place])    tested positive many years ago, most recent came back negative.   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 2019 08/27/2020   Cystic acne 05/21/2020   Hemorrhoids 06/11/2016   Chronic constipation 06/11/2016   Adjustment disorder with depressed mood    Migraines 09/04/2014   GERD (gastroesophageal reflux disease) 09/04/2014   Chronic pelvic pain in female 09/04/2014    Past Surgical History:  Procedure Laterality Date   ABDOMINAL HYSTERECTOMY  2008   BLADDER TUMOR EXCISION  2006, 05/2014   benign tumor removed x 2   CERVICAL CERCLAGE  2001, 2004   HEMORRHOID SURGERY N/A 11/06/2015   Procedure: HEMORRHOIDECTOMY;  Surgeon: Leonie Green, MD;  Location: [facility] ORS;  Service: General;  Laterality: N/A;   LEEP  1998   dysplasia    Prior to Admission medications   Medication Sig Start Date End Date Taking? Authorizing Provider  potassium chloride SA (KLOR-CON) 20 MEQ tablet Take 1 tablet (20 mEq total) by mouth 2 (two) times daily for 5 days. 05/04/21 05/09/21 Yes Rada Hay, MD  acetaminophen (TYLENOL) 500 MG tablet Take 500 mg by mouth every 6 (six) hours as needed.    [provider]  acyclovir (ZOVIRAX) 800 MG tablet TAKE 1 TABLET BY MOUTH EVERY MORNING 05/12/18   Shambley, Melody N, CNM  azithromycin (ZITHROMAX) 250 MG tablet Take 2 tablets by mouth today, then 1 tablet daily for 4 additional days. 09/05/20   Pleas Koch, NP  docusate sodium (COLACE) 250 MG capsule Take by mouth 3 (three) times daily.     [provider]  gabapentin (NEURONTIN) 800 MG tablet Take 1,200 mg by mouth 3 (three) times daily.    [provider]  ketoconazole (NIZORAL) 2 % cream Apply to rash under breasts 1-2 times daily as directed. 04/18/20   [name], MD  nitrofurantoin (MACRODANTIN) 50 MG capsule As needed 04/19/18   [provider]  spironolactone (ALDACTONE) 50 MG tablet Take 50 mg by mouth daily. 04/27/20   [provider]    Allergies Polycarbophil and Adhesive [tape]  Family History  Problem Relation Age of Onset   Diabetes Mother    Hypertension Mother    Cancer Father        lung   Diabetes Father    Hypertension Father    Heart disease Maternal Grandmother    Heart disease Maternal Grandfather    Cancer Maternal Grandfather    Cancer Paternal Grandfather    Cancer Other    Cancer Other    Stroke Neg Hx     Social History Social History   Tobacco Use   Smoking status: Former    Types: Cigarettes, E-cigarettes, Cigars    Quit date: 10/08/1999    Years since quitting: 21.5   Smokeless tobacco: Never   Tobacco comments:    quit 2001; last vaped 08/25/20  Vaping Use   Vaping Use: Never used  Substance Use Topics   Alcohol use: Yes    Alcohol/week: 15.0 standard drinks    Types: 15 Glasses of wine per week    Comment: 1+ bottle wine 08/2017   Drug use: Not Currently    Types: Marijuana    Comment: last use 1998    Review of Systems   Review of Systems  Constitutional:  Negative for chills and fever.  HENT:  Negative for congestion.   Respiratory:  Positive for chest tightness and shortness of breath.   Cardiovascular:  Positive for chest pain and palpitations. Negative for leg swelling.  Gastrointestinal:  Negative for abdominal pain, nausea and vomiting.  Psychiatric/Behavioral:  Positive for sleep disturbance. Negative for self-injury and suicidal ideas. The patient is nervous/anxious.   All other systems reviewed and are negative.  Physical Exam Updated Vital Signs BP 103/69   Pulse 85   Temp 98.5 F (36.9 C) (Oral)   Resp 16   Ht 5\' 7"  (1.702 m)   Wt 54.4 kg   SpO2 100%   BMI 18.79 kg/m   Physical Exam Vitals and nursing note reviewed.  Constitutional:      General: She is not in acute distress.    Appearance: Normal appearance.  HENT:     Head:  Normocephalic and atraumatic.  Eyes:     General: No scleral icterus.    Conjunctiva/sclera: Conjunctivae normal.  Cardiovascular:     Heart sounds: Normal heart sounds.  Pulmonary:     Effort: Pulmonary effort is normal. No respiratory distress.     Breath sounds: Normal breath sounds. No stridor.  Abdominal:     Palpations: Abdomen is soft.  Musculoskeletal:        General: No deformity or signs of injury.  Cervical back: Normal range of motion.     Right lower leg: No edema.     Left lower leg: No edema.  Skin:    General: Skin is warm and dry.     Coloration: Skin is not jaundiced or pale.  Neurological:     General: No focal deficit present.     Mental Status: She is alert and oriented to person, place, and time. Mental status is at baseline.  Psychiatric:        Mood and Affect: Mood normal.        Behavior: Behavior normal.     LABS (all labs ordered are listed, but only abnormal results are displayed)  Labs Reviewed  BASIC METABOLIC PANEL - Abnormal; Notable for the following components:      Result Value   Potassium 2.9 (*)    Glucose, Bld 168 (*)    All other components within normal limits  HEPATIC FUNCTION PANEL - Abnormal; Notable for the following components:   Total Protein 6.2 (*)    AST 14 (*)    Alkaline Phosphatase 36 (*)    All other components within normal limits  T4, FREE - Abnormal; Notable for the following components:   Free T4 1.77 (*)    All other components within normal limits  CBC  LIPASE, BLOOD  TSH  POC URINE PREG, ED  TROPONIN I (HIGH SENSITIVITY)   ____________________________________________  EKG  Sinus tachycardia, left axis deviation, prolonged QT interval, no acute ischemic changes ____________________________________________  RADIOLOGY Almeta Monas, personally viewed and evaluated these images (plain radiographs) as part of my medical decision making, as well as reviewing the written report by the  radiologist.  ED MD interpretation:  I reviewed the CXR which does not show any acute cardiopulmonary process      ____________________________________________   PROCEDURES  Procedure(s) performed (including Critical Care):  Procedures   ____________________________________________   INITIAL IMPRESSION / ASSESSMENT AND PLAN / ED COURSE     40 year old female presenting with chest pain in the setting of taking THC Gummies.  Took the Gummies because she was having difficulty sleeping, then woke up with chest pain dyspnea shortness of breath and paresthesias.  She felt very anxious at the time.  Presentation consistent with anxiety related to [facility].  Patient does not usually take THC.  She was tachycardic initially in triage and on EKG.  However at the time of my evaluation her heart rate is in the 60s normal blood pressure with 100% O2 sat.  She appears comfortable.  Exam reassuring, labs obtained from triage show a negative troponin, she is mildly hypokalemic with a potassium of 2.9.  QT is somewhat elevated on EKG as well.  Patient tells me she has been hypokalemic in the past also says she has not been eating as much normally secondary to anxiety.  Will give her 40 of p.o. potassium now and discharged with a 5-day course of oral potassium supplementation.  I advised that she follow-up with her primary care physician in about 1 week for lab recheck.  Her TSH was normal but free T4 mildly elevated.  I also advised her that she should have her thyroid function test rechecked.  She is not overtly hyperthyroid at this time no indication for treatment.  Patient has history of DVTs in the upper extremities but this was in the setting of hospitalization and multiple IVs placed, she is no longer anticoagulated.  My suspicion for pulmonary embolism is exceedingly  low at this time.  Able for discharge.      ____________________________________________   FINAL CLINICAL IMPRESSION(S) / ED  DIAGNOSES  Final diagnoses:  Chest pain, unspecified type  Hypokalemia     ED Discharge Orders          Ordered    potassium chloride SA (KLOR-CON) 20 MEQ tablet  2 times daily        05/04/21 2876             Note:  This document was prepared using Dragon voice recognition software and may include unintentional dictation errors.    Rada Hay, MD 05/04/21 [phone]

## 2021-06-10 ENCOUNTER — Encounter: Payer: Self-pay | Admitting: Nurse Practitioner

## 2021-06-10 ENCOUNTER — Ambulatory Visit (INDEPENDENT_AMBULATORY_CARE_PROVIDER_SITE_OTHER): Payer: Self-pay | Admitting: Nurse Practitioner

## 2021-06-10 ENCOUNTER — Other Ambulatory Visit: Payer: Self-pay

## 2021-06-10 VITALS — BP 92/74 | HR 105 | Temp 97.9°F | Ht 67.0 in | Wt 118.0 lb

## 2021-06-10 DIAGNOSIS — E876 Hypokalemia: Secondary | ICD-10-CM

## 2021-06-10 DIAGNOSIS — H6982 Other specified disorders of Eustachian tube, left ear: Secondary | ICD-10-CM | POA: Insufficient documentation

## 2021-06-10 DIAGNOSIS — R946 Abnormal results of thyroid function studies: Secondary | ICD-10-CM

## 2021-06-10 DIAGNOSIS — R309 Painful micturition, unspecified: Secondary | ICD-10-CM | POA: Insufficient documentation

## 2021-06-10 DIAGNOSIS — H6992 Unspecified Eustachian tube disorder, left ear: Secondary | ICD-10-CM

## 2021-06-10 HISTORY — DX: Other specified disorders of eustachian tube, left ear: H69.82

## 2021-06-10 HISTORY — DX: Unspecified Eustachian tube disorder, left ear: H69.92

## 2021-06-10 LAB — COMPREHENSIVE METABOLIC PANEL
ALT: 11 U/L (ref 0–35)
AST: 13 U/L (ref 0–37)
Albumin: 4.3 g/dL (ref 3.5–5.2)
Alkaline Phosphatase: 48 U/L (ref 39–117)
BUN: 7 mg/dL (ref 6–23)
CO2: 33 mEq/L — ABNORMAL HIGH (ref 19–32)
Calcium: 9.4 mg/dL (ref 8.4–10.5)
Chloride: 102 mEq/L (ref 96–112)
Creatinine, Ser: 0.86 mg/dL (ref 0.40–1.20)
GFR: 84.56 mL/min (ref >60.00)
Glucose, Bld: 78 mg/dL (ref 70–99)
Potassium: 4.4 mEq/L (ref 3.5–5.1)
Sodium: 141 mEq/L (ref 135–145)
Total Bilirubin: 0.6 mg/dL (ref 0.2–1.2)
Total Protein: 6.6 g/dL (ref 6.0–8.3)

## 2021-06-10 LAB — POCT URINALYSIS DIP (MANUAL ENTRY)
Bilirubin, UA: NEGATIVE
Blood, UA: NEGATIVE
Glucose, UA: NEGATIVE mg/dL
Ketones, POC UA: NEGATIVE mg/dL
Leukocytes, UA: NEGATIVE
Nitrite, UA: NEGATIVE
Spec Grav, UA: 1.015 (ref 1.010–1.025)
Urobilinogen, UA: 0.2 E.U./dL
pH, UA: 6 (ref 5.0–8.0)

## 2021-06-10 LAB — TSH: TSH: 3.04 u[IU]/mL (ref 0.35–5.50)

## 2021-06-10 LAB — T4, FREE: Free T4: 0.71 ng/dL (ref 0.60–1.60)

## 2021-06-10 MED ORDER — LEVOCETIRIZINE DIHYDROCHLORIDE 5 MG PO TABS: 5.0000 mg | ORAL_TABLET | Freq: Every evening | ORAL | 0 refills | Status: DC

## 2021-06-10 MED ORDER — FLUTICASONE PROPIONATE 50 MCG/ACT NA SUSP: 2.0000 | Freq: Every day | NASAL | 0 refills | Status: DC

## 2021-06-10 NOTE — Assessment & Plan Note (Signed)
Was seen in the emergency department 2 months ago noticed to have hypokalemia.  We will recheck labs today to make sure she is stabilized.  Pending lab results

## 2021-06-10 NOTE — Progress Notes (Signed)
Acute Office Visit  Subjective:    Patient ID: Sonia Smith, female    DOB: 12-Dec-1980, 40 y.o.   MRN: [id number]  Chief Complaint  Patient presents with   Urinary Tract Infection    HPI Patient is in today for Urinary symptoms  Symptoms stated that symptoms started approx 1 week ago States that she was having burning that has increased from her baseline. Burning with urination and post urination and lower abdominal pain with nausea. Does have marcobid that she take when she has intercourse. Had incourse 1 day proir to her symptoms started Hx of UTI in 2014 took abx and then buring  No concern for STI  Left ear discomfort: States that approx 1 month ago she took an edible. Started aving chest pain and found to have hypokalemia. Did have ringing in the right and left ears. Right has resovled but left is still there.  Past Medical History:  Diagnosis Date   Blood in stool    Childhood asthma    pt has out grown   Depression    in high school   DVT (deep venous thrombosis) ([place])    in left arm, finishing eliquis on tomorrow 12/01/2017   Dysplastic nevus 04/30/2007   L-4 web space. Slight to moderate atypia, extends to edge.   Dysrhythmia    GERD (gastroesophageal reflux disease)    Lupus ([place])    tested positive many years ago, most recent came back negative.   Migraines    Neuromuscular disorder ([place])    right pinkey and ring finger   Painful intercourse    Pudendal neuralgia    Vaginal Pap smear, abnormal     Past Surgical History:  Procedure Laterality Date   ABDOMINAL HYSTERECTOMY  2008   BLADDER TUMOR EXCISION  2006, 05/2014   benign tumor removed x 2   CERVICAL CERCLAGE  2001, 2004   HEMORRHOID SURGERY N/A 11/06/2015   Procedure: HEMORRHOIDECTOMY;  Surgeon: Leonie Green, MD;  Location: [facility] ORS;  Service: General;  Laterality: N/A;   LEEP  1998   dysplasia    Family History  Problem Relation Age of Onset   Diabetes Mother    Hypertension Mother     Cancer Father        lung   Diabetes Father    Hypertension Father    Heart disease Maternal Grandmother    Heart disease Maternal Grandfather    Cancer Maternal Grandfather    Cancer Paternal Grandfather    Cancer Other    Cancer Other    Stroke Neg Hx     Social History   Socioeconomic History   Marital status: Divorced    Spouse name: Not on file   Number of children: Not on file   Years of education: Not on file   Highest education level: Not on file  Occupational History   Not on file  Tobacco Use   Smoking status: Former    Types: Cigarettes, E-cigarettes, Cigars    Quit date: 10/08/1999    Years since quitting: 21.6   Smokeless tobacco: Never   Tobacco comments:    quit 2001; last vaped 08/25/20  Vaping Use   Vaping Use: Never used  Substance and Sexual Activity   Alcohol use: Yes    Alcohol/week: 15.0 standard drinks    Types: 15 Glasses of wine per week    Comment: 1+ bottle wine 08/2017   Drug use: Not Currently    Types: Marijuana  Comment: last use 1998   Sexual activity: Yes    Partners: Male    Birth control/protection: Surgical  Other Topics Concern   Not on file  Social History Narrative   Not on file   Social Determinants of Health   Financial Resource Strain: Not on file  Food Insecurity: Not on file  Transportation Needs: Not on file  Physical Activity: Not on file  Stress: Not on file  Social Connections: Not on file  Intimate Partner Violence: Not on file    Outpatient Medications Prior to Visit  Medication Sig Dispense Refill   acetaminophen (TYLENOL) 500 MG tablet Take 500 mg by mouth every 6 (six) hours as needed.     acyclovir (ZOVIRAX) 800 MG tablet TAKE 1 TABLET BY MOUTH EVERY MORNING 90 tablet 2   azithromycin (ZITHROMAX) 250 MG tablet Take 2 tablets by mouth today, then 1 tablet daily for 4 additional days. 6 tablet 0   docusate sodium (COLACE) 250 MG capsule Take by mouth 3 (three) times daily.      gabapentin  (NEURONTIN) 800 MG tablet Take 1,200 mg by mouth 3 (three) times daily.     ketoconazole (NIZORAL) 2 % cream Apply to rash under breasts 1-2 times daily as directed. 60 g 1   nitrofurantoin (MACRODANTIN) 50 MG capsule As needed  0   potassium chloride SA (KLOR-CON) 20 MEQ tablet Take 1 tablet (20 mEq total) by mouth 2 (two) times daily for 5 days. 10 tablet 0   spironolactone (ALDACTONE) 50 MG tablet Take 50 mg by mouth daily.     No facility-administered medications prior to visit.    Allergies  Allergen Reactions   Polycarbophil Other (See Comments)    Redness, skin peeling   Adhesive [Tape] Rash    From bandaides. Tape is OK.    Review of Systems  Constitutional:  Positive for fatigue. Negative for chills and fever.  HENT:  Positive for ear pain (fullness) and tinnitus (left).   Respiratory:  Negative for cough and shortness of breath.   Cardiovascular:  Negative for chest pain.  Gastrointestinal:  Positive for abdominal pain.  Genitourinary:  Positive for dysuria. Negative for frequency and hematuria.      Objective:    Physical Exam Vitals and nursing note reviewed.  Constitutional:      Appearance: Normal appearance.  HENT:     Head:     Comments: Clear fluid behind left TM    Right Ear: Tympanic membrane, ear canal and external ear normal. There is no impacted cerumen.     Left Ear: Ear canal and external ear normal. There is no impacted cerumen.     Mouth/Throat:     Mouth: Mucous membranes are moist.  Eyes:     Extraocular Movements: Extraocular movements intact.     Pupils: Pupils are equal, round, and reactive to light.  Abdominal:     General: Bowel sounds are normal.     Tenderness: There is abdominal tenderness. There is no right CVA tenderness or left CVA tenderness.    Neurological:     Mental Status: She is alert.    Pulse (!) 105   Temp 97.9 F (36.6 C) (Temporal)   Ht 5\' 7"  (1.702 m)   Wt 118 lb (53.5 kg)   SpO2 99%   BMI 18.48 kg/m  Wt  Readings from Last 3 Encounters:  06/10/21 118 lb (53.5 kg)  05/04/21 120 lb (54.4 kg)  09/05/20 131 lb (59.4 kg)  Health Maintenance Due  Topic Date Due   COVID-19 Vaccine (1) Never done   PAP SMEAR-Modifier  05/16/2021    There are no preventive care reminders to display for this patient.   Lab Results  Component Value Date   TSH 2.234 05/04/2021   Lab Results  Component Value Date   WBC 8.5 05/04/2021   HGB 13.9 05/04/2021   HCT 40.2 05/04/2021   MCV 94.4 05/04/2021   PLT 246 05/04/2021   Lab Results  Component Value Date   NA 139 05/04/2021   K 2.9 (L) 05/04/2021   CO2 27 05/04/2021   GLUCOSE 168 (H) 05/04/2021   BUN 10 05/04/2021   CREATININE 0.88 05/04/2021   BILITOT 0.5 05/04/2021   ALKPHOS 36 (L) 05/04/2021   AST 14 (L) 05/04/2021   ALT 12 05/04/2021   PROT 6.2 (L) 05/04/2021   ALBUMIN 3.8 05/04/2021   CALCIUM 9.1 05/04/2021   ANIONGAP 9 05/04/2021   GFR 69.93 05/21/2020   Lab Results  Component Value Date   CHOL 183 05/21/2020   Lab Results  Component Value Date   HDL 39.20 05/21/2020   Lab Results  Component Value Date   LDLCALC 129 (H) 05/21/2020   Lab Results  Component Value Date   TRIG 77.0 05/21/2020   Lab Results  Component Value Date   CHOLHDL 5 05/21/2020   No results found for: HGBA1C     Assessment & Plan:   Problem List Items Addressed This Visit       Nervous and Auditory   Eustachian tube dysfunction, left    Patient complaining of left sided tinnitus and ear fullness.  TM not infected on exam did have some fluid behind it we will start antihistamine and Flonase to see if this resolves.  Patient is established with ENT if no resolution within a week she is to schedule with them.  Continue to monitor      Relevant Medications   fluticasone (FLONASE) 50 MCG/ACT nasal spray   levocetirizine (XYZAL) 5 MG tablet     Other   Painful urination - Primary    Has been experiencing dysuria 1 day post sexual intercourse.   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  Patient's UA in office not indicative of urinary tract infection and gets a flare of her chronic conditions.  Continue to monitor.  Pending urine culture      Relevant Orders   POCT urinalysis dipstick (Completed)   Urine Culture   Abnormal thyroid function test    Did have abnormal T4 on emergency department visit.  Patient was taking biotin over-the-counter supplement at that time.  She has since stopped it we will recheck TSH and T4 today, pending lab results      Relevant Orders   T4, free   TSH   Hypokalemia    Was seen in the emergency department 2 months ago noticed to have hypokalemia.  We will recheck labs today to make sure she is stabilized.  Pending lab results      Relevant Orders   Comprehensive metabolic panel     No orders of the defined types were placed in this encounter.  This visit occurred during the SARS-CoV-2 public health emergency.  Safety protocols were in place, including screening questions prior to the visit, additional usage of staff PPE, and extensive cleaning of exam room while observing appropriate contact time as indicated for disinfecting solutions.  Romilda Garret, NP

## 2021-06-10 NOTE — Patient Instructions (Signed)
Nice to see you today Will be in touch regarding the labs and urine culture Will get you an appointment with me for TOC/physical after the first of the year

## 2021-06-10 NOTE — Assessment & Plan Note (Signed)
Did have abnormal T4 on emergency department visit.  Patient was taking biotin over-the-counter supplement at that time.  She has since stopped it we will recheck TSH and T4 today, pending lab results

## 2021-06-10 NOTE — Assessment & Plan Note (Signed)
Patient complaining of left sided tinnitus and ear fullness.  TM not infected on exam did have some fluid behind it we will start antihistamine and Flonase to see if this resolves.  Patient is established with ENT if no resolution within a week she is to schedule with them.  Continue to monitor

## 2021-06-10 NOTE — Assessment & Plan Note (Signed)
Has been experiencing dysuria 1 day post sexual intercourse.  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  Patient's UA in office not indicative of urinary tract infection and gets a flare of her chronic conditions.  Continue to monitor.  Pending urine culture

## 2021-06-11 LAB — URINE CULTURE
MICRO NUMBER:: 12602742
Result:: NO GROWTH
SPECIMEN QUALITY:: ADEQUATE

## 2021-07-12 ENCOUNTER — Encounter: Payer: Self-pay | Admitting: Family

## 2021-07-12 ENCOUNTER — Other Ambulatory Visit: Payer: Self-pay

## 2021-07-12 ENCOUNTER — Ambulatory Visit (INDEPENDENT_AMBULATORY_CARE_PROVIDER_SITE_OTHER): Payer: Self-pay | Admitting: Family

## 2021-07-12 VITALS — BP 104/72 | HR 96 | Temp 97.4°F | Ht 67.0 in | Wt 117.0 lb

## 2021-07-12 DIAGNOSIS — F4310 Post-traumatic stress disorder, unspecified: Secondary | ICD-10-CM

## 2021-07-12 DIAGNOSIS — Z1231 Encounter for screening mammogram for malignant neoplasm of breast: Secondary | ICD-10-CM

## 2021-07-12 DIAGNOSIS — K5909 Other constipation: Secondary | ICD-10-CM

## 2021-07-12 DIAGNOSIS — N3000 Acute cystitis without hematuria: Secondary | ICD-10-CM

## 2021-07-12 DIAGNOSIS — R300 Dysuria: Secondary | ICD-10-CM

## 2021-07-12 DIAGNOSIS — B009 Herpesviral infection, unspecified: Secondary | ICD-10-CM

## 2021-07-12 DIAGNOSIS — E559 Vitamin D deficiency, unspecified: Secondary | ICD-10-CM

## 2021-07-12 DIAGNOSIS — Z8659 Personal history of other mental and behavioral disorders: Secondary | ICD-10-CM

## 2021-07-12 DIAGNOSIS — N390 Urinary tract infection, site not specified: Secondary | ICD-10-CM

## 2021-07-12 DIAGNOSIS — R309 Painful micturition, unspecified: Secondary | ICD-10-CM

## 2021-07-12 DIAGNOSIS — K649 Unspecified hemorrhoids: Secondary | ICD-10-CM

## 2021-07-12 DIAGNOSIS — F4321 Adjustment disorder with depressed mood: Secondary | ICD-10-CM

## 2021-07-12 DIAGNOSIS — G43109 Migraine with aura, not intractable, without status migrainosus: Secondary | ICD-10-CM

## 2021-07-12 DIAGNOSIS — G588 Other specified mononeuropathies: Secondary | ICD-10-CM

## 2021-07-12 LAB — POCT URINALYSIS DIP (MANUAL ENTRY)
Bilirubin, UA: NEGATIVE
Glucose, UA: NEGATIVE mg/dL
Ketones, POC UA: NEGATIVE mg/dL
Nitrite, UA: POSITIVE — AB
Protein Ur, POC: NEGATIVE mg/dL
Spec Grav, UA: 1.01 (ref 1.010–1.025)
Urobilinogen, UA: 0.2 E.U./dL
pH, UA: 6 (ref 5.0–8.0)

## 2021-07-12 MED ORDER — NITROFURANTOIN MONOHYD MACRO 100 MG PO CAPS: 100.0000 mg | ORAL_CAPSULE | Freq: Two times a day (BID) | ORAL | 14 refills | Status: DC

## 2021-07-12 NOTE — Assessment & Plan Note (Addendum)
Continue follow-up and management with your urologist as scheduled.  Continue gabapentin as prescribed. Approximate time spent in the office with patient 45 minutes discussing history as well is looking through the notes lab work and past consults in regards to extensive diagnoses.

## 2021-07-12 NOTE — Assessment & Plan Note (Addendum)
Continue with acyclovir as needed, no current outbreak.  Stable
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
## 2021-07-12 NOTE — Patient Instructions (Signed)
It was a pleasure seeing you today.   You were found to have a urinary tract infection, you have been prescribed an antibiotic to your preferred pharmacy. Please start antibiotic today as directed.   We are sending your urine for a culture to make sure you do not have a resistant bacteria. We will call you if we need to change your medications.   Please make sure you are drinking plenty of fluids over the next few days.  If your symptoms do not improve over the next 5-7 days, or if they worsen, please let us know. Please also let us know if you have worsening back pain, fevers, chills, or body aches.   Regards,   Ranelle Auker  

## 2021-07-12 NOTE — Assessment & Plan Note (Signed)
Continue current management with Aleve as needed with onset of headache pain.  If frequency is to increase at any time please follow-up for further evaluation

## 2021-07-12 NOTE — Assessment & Plan Note (Signed)
Stable at current, continue daily Colace which is helpful for To strain.

## 2021-07-12 NOTE — Assessment & Plan Note (Signed)
Recommended patient take daily over-the-counter vitamin D3 2000 IUs once daily.  We will continue to periodically monitor as necessary with labwork.

## 2021-07-12 NOTE — Assessment & Plan Note (Signed)
Continue daily Colace supplement which is helpful at this time

## 2021-07-12 NOTE — Assessment & Plan Note (Signed)
Urinalysis as well as urine culture ordered for today pending results of the urine culture.  Treating for urinary tract infection symptoms.

## 2021-07-12 NOTE — Assessment & Plan Note (Addendum)
Prescription for antibiotic given to patient and sent to pharmacy.  Prescriptions for Macrobid 500 mg take 1 p.o. twice daily for the next 7 days.  If symptoms do not improve in the next 2 to 3 days please notify the office.  Increase oral fluids.

## 2021-07-15 LAB — URINE CULTURE
MICRO NUMBER:: 12737945
SPECIMEN QUALITY:: ADEQUATE

## 2021-07-15 MED ORDER — NITROFURANTOIN MONOHYD MACRO 100 MG PO CAPS: 100.0000 mg | ORAL_CAPSULE | Freq: Two times a day (BID) | ORAL | 0 refills | Status: AC

## 2021-07-25 ENCOUNTER — Ambulatory Visit: Payer: Self-pay | Admitting: Nurse Practitioner

## 2021-07-25 ENCOUNTER — Other Ambulatory Visit: Payer: Self-pay

## 2021-07-25 ENCOUNTER — Encounter: Payer: Self-pay | Admitting: Nurse Practitioner

## 2021-07-25 DIAGNOSIS — B379 Candidiasis, unspecified: Secondary | ICD-10-CM

## 2021-07-25 DIAGNOSIS — Z113 Encounter for screening for infections with a predominantly sexual mode of transmission: Secondary | ICD-10-CM

## 2021-07-25 LAB — WET PREP FOR TRICH, YEAST, CLUE
Trichomonas Exam: NEGATIVE
Yeast Exam: NEGATIVE

## 2021-07-25 MED ORDER — CLOTRIMAZOLE 1 % VA CREA: 1.0000 | TOPICAL_CREAM | Freq: Every day | VAGINAL | 0 refills | Status: AC

## 2021-07-25 NOTE — Progress Notes (Signed)
[facility] Department  STI clinic/screening visit [place] [address] [phone]  Subjective:  Sonia Smith is a 40 y.o. female being seen today for an STI screening visit. The patient reports they do have symptoms.  Patient reports that they do not desire a pregnancy in the next year.   They reported they are not interested in discussing contraception today.    No LMP recorded. Patient has had a hysterectomy.   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  Patient states that she has a history of frequent UTIs. Patient has a medical history that consist of pudendal neuralgia.  Last UTI was 1 year ago, until recently.  She was seen by a doctor on 07/12/21 where she was diagnosed with a UTI and prescribed Macrobid. She finished last dose on 07/19/21.  Patient also took Deale on 07/17/21 for yeast.  Patient noticed after intercourse with her partner that she began to develop small Donne Baley spots on her tonsils and noticed that they were inflamed.  Patient also reported some vaginal irritation.  Would like to be screened for STDs today.  Last HIV test per patient/review of record was 08/27/2020. Patient reports last pap was 05/16/2016.   Screening for MPX risk: Does the patient have an unexplained rash? No Is the patient MSM? No Does the patient endorse multiple sex partners or anonymous sex partners? No Did the patient have close or sexual contact with a person  diagnosed with MPX? No Has the patient traveled outside the [place] where MPX is endemic? No Is there a high clinical suspicion for MPX-- evidenced by one of the following No  -Unlikely to be chickenpox  -Lymphadenopathy  -Rash that present in same phase of evolution on any given body part See flowsheet for further details and programmatic requirements.    The following portions of the patient's history were reviewed and updated as appropriate: allergies, current medications, past medical history, past social history, past surgical history and problem list.  Objective:  There were no vitals filed for this visit.  Physical Exam Constitutional:      Appearance: Normal appearance.  HENT:     Head: Normocephalic and atraumatic.     Mouth/Throat:     Comments: No visible signs of dental caries.  Pulmonary:     Effort: Pulmonary effort is normal.  Abdominal:     General: Abdomen is flat.     Palpations: Abdomen is soft.  Genitourinary:    General: Normal vulva.     Rectum: Normal.     Comments: External genitalia/pubic area without nits, lice, edema, lesions and inguinal adenopathy. Redness noted to external genital and pubic area.   Vagina with normal mucosa and discharge. Cervix without visible lesions. Uterus firm, mobile, nt, no masses, no CMT, no adnexal tenderness or fullness. pH 4.5. Musculoskeletal:     Cervical back: Normal range of motion and neck supple.  Lymphadenopathy:     Cervical: Cervical adenopathy present.     Right  cervical: Superficial cervical adenopathy present.     Left cervical: Superficial cervical adenopathy present.  Skin:    General: Skin is warm and dry.  Neurological:     Mental Status: She is alert and oriented to person, place, and time.  Psychiatric:        Mood and Affect: Mood normal.        Behavior: Behavior normal. Behavior is cooperative.     Assessment and Plan:  [name] is a 40 y.o. female presenting to the [facility] Department for STI screening  1. Screening examination for venereal disease -40 year old female seen in clinic today for STD screening.  -No visible signs of Shaylynne Lunt spots noted on tonsils.  No inflammation noted.   -Patient accepted all screenings including oral, vaginal CT/GC and bloodwork for HIV/RPR.  Patient meets criteria for HepB screening? No. Ordered? No - low risk  Patient meets criteria for HepC screening? No. Ordered? No, low risk   Discussed time line for [facility] results and that patient will be called with positive results and encouraged patient to call if she had not heard in 2 weeks.  Counseled to return or seek care for continued or worsening symptoms Recommended condom use with all sex  Patient is currently using Sterilization by Laparoscopy to prevent pregnancy.    - HIV [place] [facility] - Syphilis Serology, [place] [facility] - Chlamydia/Gonorrhea [place] [facility] - WET PREP FOR [place], YEAST, CLUE - Gonococcus culture  2. Yeast infection -Wet mount reviewed.  Redness noted to external pubic and genital area.  May treat patient with clotrimazole for vaginal irritation.   - clotrimazole (GYNE-LOTRIMIN) 1 % vaginal cream; Place 1 Applicatorful vaginally at bedtime for 7 days.  Dispense: 45 g; Refill: 0      Return if symptoms worsen or fail to improve.    Gregary Cromer, FNP

## 2021-07-25 NOTE — Progress Notes (Signed)
Patient seen today for STD testing. Condoms given. Wet mount reviewed by provider. Clotrimazole vaginal cream provided to patient. Providers orders completed.

## 2021-07-29 LAB — GONOCOCCUS CULTURE

## 2021-09-13 ENCOUNTER — Ambulatory Visit
Admission: RE | Admit: 2021-09-13 | Discharge: 2021-09-13 | Disposition: A | Discharge status: Home / Self Care | Payer: Self-pay | Source: Ambulatory Visit | Attending: Family | Admitting: Family

## 2021-09-13 ENCOUNTER — Other Ambulatory Visit: Payer: Self-pay

## 2021-09-13 DIAGNOSIS — Z1231 Encounter for screening mammogram for malignant neoplasm of breast: Secondary | ICD-10-CM | POA: Insufficient documentation

## 2021-10-16 ENCOUNTER — Other Ambulatory Visit: Payer: Self-pay

## 2021-10-16 ENCOUNTER — Encounter: Payer: Self-pay | Admitting: Neurology

## 2021-10-16 ENCOUNTER — Encounter: Payer: Self-pay | Admitting: Family

## 2021-10-16 ENCOUNTER — Ambulatory Visit (INDEPENDENT_AMBULATORY_CARE_PROVIDER_SITE_OTHER): Payer: Self-pay | Admitting: Family

## 2021-10-16 VITALS — BP 98/60 | HR 128 | Ht 67.0 in | Wt 111.0 lb

## 2021-10-16 DIAGNOSIS — R9431 Abnormal electrocardiogram [ECG] [EKG]: Secondary | ICD-10-CM | POA: Insufficient documentation

## 2021-10-16 DIAGNOSIS — R197 Diarrhea, unspecified: Secondary | ICD-10-CM | POA: Insufficient documentation

## 2021-10-16 DIAGNOSIS — R300 Dysuria: Secondary | ICD-10-CM

## 2021-10-16 DIAGNOSIS — R55 Syncope and collapse: Secondary | ICD-10-CM | POA: Insufficient documentation

## 2021-10-16 DIAGNOSIS — R000 Tachycardia, unspecified: Secondary | ICD-10-CM | POA: Insufficient documentation

## 2021-10-16 DIAGNOSIS — R634 Abnormal weight loss: Secondary | ICD-10-CM | POA: Insufficient documentation

## 2021-10-16 DIAGNOSIS — N3001 Acute cystitis with hematuria: Secondary | ICD-10-CM | POA: Insufficient documentation

## 2021-10-16 DIAGNOSIS — R1013 Epigastric pain: Secondary | ICD-10-CM | POA: Insufficient documentation

## 2021-10-16 LAB — CBC WITH DIFFERENTIAL/PLATELET
Basophils Absolute: 0 10*3/uL (ref 0.0–0.1)
Basophils Relative: 0.7 % (ref 0.0–3.0)
Eosinophils Absolute: 0.1 10*3/uL (ref 0.0–0.7)
Eosinophils Relative: 1.1 % (ref 0.0–5.0)
HCT: 48.8 % — ABNORMAL HIGH (ref 36.0–46.0)
Hemoglobin: 16.2 g/dL — ABNORMAL HIGH (ref 12.0–15.0)
Lymphocytes Relative: 26.6 % (ref 12.0–46.0)
Lymphs Abs: 1.9 10*3/uL (ref 0.7–4.0)
MCHC: 33.1 g/dL (ref 30.0–36.0)
MCV: 96.9 fl (ref 78.0–100.0)
Monocytes Absolute: 0.5 10*3/uL (ref 0.1–1.0)
Monocytes Relative: 6.6 % (ref 3.0–12.0)
Neutro Abs: 4.7 10*3/uL (ref 1.4–7.7)
Neutrophils Relative %: 65 % (ref 43.0–77.0)
Platelets: 240 10*3/uL (ref 150.0–400.0)
RBC: 5.04 Mil/uL (ref 3.87–5.11)
RDW: 12.2 % (ref 11.5–15.5)
WBC: 7.2 10*3/uL (ref 4.0–10.5)

## 2021-10-16 LAB — COMPREHENSIVE METABOLIC PANEL
ALT: 10 U/L (ref 0–35)
AST: 11 U/L (ref 0–37)
Albumin: 4.9 g/dL (ref 3.5–5.2)
Alkaline Phosphatase: 44 U/L (ref 39–117)
BUN: 8 mg/dL (ref 6–23)
CO2: 31 mEq/L (ref 19–32)
Calcium: 10.4 mg/dL (ref 8.4–10.5)
Chloride: 102 mEq/L (ref 96–112)
Creatinine, Ser: 0.89 mg/dL (ref 0.40–1.20)
GFR: 80.96 mL/min (ref >60.00)
Glucose, Bld: 104 mg/dL — ABNORMAL HIGH (ref 70–99)
Potassium: 4.4 mEq/L (ref 3.5–5.1)
Sodium: 140 mEq/L (ref 135–145)
Total Bilirubin: 0.5 mg/dL (ref 0.2–1.2)
Total Protein: 7 g/dL (ref 6.0–8.3)

## 2021-10-16 LAB — POC URINALSYSI DIPSTICK (AUTOMATED)
Bilirubin, UA: NEGATIVE
Glucose, UA: NEGATIVE
Leukocytes, UA: NEGATIVE
Nitrite, UA: NEGATIVE
Protein, UA: POSITIVE — AB
Spec Grav, UA: >=1.03 — AB (ref 1.010–1.025)
Urobilinogen, UA: 0.2 E.U./dL
pH, UA: 5 (ref 5.0–8.0)

## 2021-10-16 LAB — B12 AND FOLATE PANEL
Folate: 3.3 ng/mL — ABNORMAL LOW (ref >5.9)
Vitamin B-12: 205 pg/mL — ABNORMAL LOW (ref 211–911)

## 2021-10-16 LAB — T3, FREE: T3, Free: 3.3 pg/mL (ref 2.3–4.2)

## 2021-10-16 LAB — TSH: TSH: 1.8 u[IU]/mL (ref 0.35–5.50)

## 2021-10-16 LAB — T4, FREE: Free T4: 0.93 ng/dL (ref 0.60–1.60)

## 2021-10-16 MED ORDER — PANTOPRAZOLE SODIUM 40 MG PO TBEC: 40.0000 mg | DELAYED_RELEASE_TABLET | Freq: Every day | ORAL | 3 refills | Status: DC

## 2021-10-16 MED ORDER — CIPROFLOXACIN HCL 500 MG PO TABS: 500.0000 mg | ORAL_TABLET | Freq: Two times a day (BID) | ORAL | 0 refills | Status: AC

## 2021-10-16 NOTE — Assessment & Plan Note (Signed)
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

## 2021-10-16 NOTE — Assessment & Plan Note (Signed)
Ongoing, becoming more frequent, never with full workup per pt.  ?Seizures age 41, not since, and febrile.  ?Ref neurologist as well as cardiologist.  ?R/o seizure possibility, sick sinus syndrome, cardiac etiology vs neuro.  ?Labs to r/o other causes. Increase oral intake of water as well to avoid dehydration. ?

## 2021-10-16 NOTE — Assessment & Plan Note (Addendum)
poct urine ?And urine culture, pending ? ?Time in office reviewing notes from neurology in past as well as reviewed present and current ekg, old labwork from notes, as well as discussing acute and chronic concerns with patent totaled 46 minutes. ?

## 2021-10-16 NOTE — Addendum Note (Signed)
Addended by: Carter Kitten on: 10/16/2021 01:13 PM ? ? Modules accepted: Orders ? ?

## 2021-10-16 NOTE — Patient Instructions (Addendum)
It was a pleasure seeing you today.  ? ?Stop by the lab prior to leaving today. I will notify you of your results once received.  ? ?A referral was placed today for neurology, cardiologist, as well as gastroenterology. ?Please let us know if you have not heard back within 1 week about your referral. ? ?I have sent an rx for 30 days of pantoprazole .  ?Take this medication for two weeks, administer 30 minutes prior to breakfast each am. Try to decrease and or avoid spicy foods, fried fatty foods, and also caffeine and chocolate as these can increase heartburn symptoms.  ? ? ?You were found to have a urinary tract infection, you have been prescribed an antibiotic to your preferred pharmacy. Please start antibiotic today as directed.  ? ?We are sending your urine for a culture to make sure you do not have a resistant bacteria. We will call you if we need to change your medications.  ? ?Please make sure you are drinking plenty of fluids over the next few days. ? ?If your symptoms do not improve over the next 5-7 days, or if they worsen, please let us know. Please also let us know if you have worsening back pain, fevers, chills, or body aches.  ? ?Please also reach out to your urologist as well as ongoing with interstitial cystitis.  ? ?Regards,  ? ?Shalia Bartko ? ?

## 2021-10-16 NOTE — Assessment & Plan Note (Signed)
ekg today.  ?Suggestive RBB / fascicular block. Pt 'thinks' may be past dx with this but can't remember. ?Urgent referral to cardiologist in lieu of syncopal episodes as well.  ? ?

## 2021-10-16 NOTE — Assessment & Plan Note (Signed)
Trial pantoprazole ?Ordering h pylori and alpha gal to r/o ?Try to decrease and or avoid spicy foods, fried fatty foods, and also caffeine and chocolate as these can increase heartburn symptoms.  ? ?

## 2021-10-16 NOTE — Assessment & Plan Note (Signed)
Ref to GI ?Workup for this with lab work pending results. ?Thyroid panel, cbc, cmp, hiv ?

## 2021-10-16 NOTE — Assessment & Plan Note (Signed)
Stool cards today  ?immodium prn ?Ref GI as chronic ?

## 2021-10-16 NOTE — Assessment & Plan Note (Signed)
Choosing to treat with hematuria/protein and symptomatic ?rx for cipro 500 mg bid x 3 days #6 ?F/u with urology for IC ?
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
## 2021-10-16 NOTE — Progress Notes (Signed)
Reviewed in office with pt  ?Tx for uti as symptomatic ?Urgent referral to cardiologist

## 2021-10-17 LAB — H. PYLORI ANTIBODY, IGG: H Pylori IgG: NEGATIVE

## 2021-10-18 ENCOUNTER — Other Ambulatory Visit: Payer: Medicaid Other

## 2021-10-18 ENCOUNTER — Other Ambulatory Visit: Payer: Self-pay | Admitting: Family

## 2021-10-18 DIAGNOSIS — R197 Diarrhea, unspecified: Secondary | ICD-10-CM

## 2021-10-18 DIAGNOSIS — D528 Other folate deficiency anemias: Secondary | ICD-10-CM | POA: Insufficient documentation

## 2021-10-18 DIAGNOSIS — D519 Vitamin B12 deficiency anemia, unspecified: Secondary | ICD-10-CM | POA: Insufficient documentation

## 2021-10-18 LAB — THYROID PEROXIDASE ANTIBODIES (TPO) (REFL): Thyroperoxidase Ab SerPl-aCnc: 1 IU/mL (ref <9)

## 2021-10-18 MED ORDER — FOLIC ACID 1 MG PO TABS
1.0000 mg | ORAL_TABLET | Freq: Every day | ORAL | 1 refills | Status: AC
Start: 2021-10-18 — End: 2022-04-16

## 2021-10-18 NOTE — Progress Notes (Signed)
B12 is extremely low as well as folate.  I have sent in prescription for folic acid that I want patient to take daily as well as set up patient for B12 injections that we will administer once per month for the next 3 months.  Also recommend over-the-counter vitamin B12 1000 mcg once daily.  Have patient follow-up in 3 months and we will repeat B12. ? ?Hemoglobin and hematocrit are slightly elevated this can be with the B12 deficiency so what we will do when we repeat the B12 is repeat this to see if the numbers have declined. ? ?HIV was negative ?Urine culture was negative ?Thyroid was okay ?H. pylori negative ?Still pending stool cultures

## 2021-10-22 NOTE — Progress Notes (Signed)
Can we schedule pt for office or video visit to discuss her alpha gal results?

## 2021-10-23 ENCOUNTER — Encounter: Payer: Self-pay | Admitting: Neurology

## 2021-10-23 ENCOUNTER — Other Ambulatory Visit: Payer: Self-pay

## 2021-10-23 ENCOUNTER — Ambulatory Visit (INDEPENDENT_AMBULATORY_CARE_PROVIDER_SITE_OTHER): Payer: Self-pay | Admitting: Neurology

## 2021-10-23 VITALS — BP 111/82 | HR 99 | Ht 67.0 in | Wt 112.6 lb

## 2021-10-23 DIAGNOSIS — R55 Syncope and collapse: Secondary | ICD-10-CM

## 2021-10-23 NOTE — Patient Instructions (Signed)
Good to meet you. Proceed with GI evaluation. Increase water intake and liberalize salt intake. Try counterpressure exercises. Follow-up as needed, call for any changes ?
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
## 2021-10-24 ENCOUNTER — Encounter: Payer: Self-pay | Admitting: Family

## 2021-10-24 ENCOUNTER — Telehealth (INDEPENDENT_AMBULATORY_CARE_PROVIDER_SITE_OTHER): Payer: Self-pay | Admitting: Family

## 2021-10-24 VITALS — Ht 67.0 in | Wt 112.0 lb

## 2021-10-24 DIAGNOSIS — D528 Other folate deficiency anemias: Secondary | ICD-10-CM

## 2021-10-24 DIAGNOSIS — T781XXA Other adverse food reactions, not elsewhere classified, initial encounter: Secondary | ICD-10-CM

## 2021-10-24 DIAGNOSIS — R1013 Epigastric pain: Secondary | ICD-10-CM

## 2021-10-24 DIAGNOSIS — D519 Vitamin B12 deficiency anemia, unspecified: Secondary | ICD-10-CM

## 2021-10-24 LAB — GIARDIA ANTIGEN
MICRO NUMBER:: 13145843
RESULT:: NOT DETECTED
SPECIMEN QUALITY:: ADEQUATE

## 2021-10-24 LAB — C. DIFFICILE GDH AND TOXIN A/B
GDH ANTIGEN: NOT DETECTED
MICRO NUMBER:: 13145915
SPECIMEN QUALITY:: ADEQUATE
TOXIN A AND B: NOT DETECTED

## 2021-10-24 LAB — GASTROINTESTINAL PATHOGEN PNL

## 2021-10-24 MED ORDER — EPINEPHRINE 0.3 MG/0.3ML IJ SOAJ: 0.3000 mg | INTRAMUSCULAR | 1 refills | Status: AC | PRN
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
## 2021-10-25 LAB — GIARDIA ANTIGEN

## 2021-10-25 LAB — ALPHA-GAL PANEL
Allergen, Mutton, f88: 0.37 kU/L — ABNORMAL HIGH
Allergen, Pork, f26: 0.26 kU/L — ABNORMAL HIGH
Beef: 0.48 kU/L — ABNORMAL HIGH
CLASS: 1
Class: 1
GALACTOSE-ALPHA-1,3-GALACTOSE IGE*: 1.02 kU/L — ABNORMAL HIGH (ref <0.10)

## 2021-10-25 LAB — GASTROINTESTINAL PATHOGEN PNL

## 2021-10-25 LAB — HIV ANTIBODY (ROUTINE TESTING W REFLEX): HIV 1&2 Ab, 4th Generation: NONREACTIVE

## 2021-10-25 LAB — URINE CULTURE
MICRO NUMBER:: 13134434
SPECIMEN QUALITY:: ADEQUATE

## 2021-10-25 LAB — INTERPRETATION:

## 2021-10-27 DIAGNOSIS — T781XXA Other adverse food reactions, not elsewhere classified, initial encounter: Secondary | ICD-10-CM | POA: Insufficient documentation

## 2021-10-27 NOTE — Assessment & Plan Note (Signed)
Improving with ppi  ?

## 2021-10-27 NOTE — Assessment & Plan Note (Signed)
Pt set up for b12 injections ?Recommended otc b12 1000 mcg once daily as well ?

## 2021-10-27 NOTE — Assessment & Plan Note (Signed)
New diagnosis of alpha gal ?D/w pt in detail ?rx sent for epi pen 0.3 ml in case of anaphylaxis ?Advised pt allergic to mutton, pork, and beef and to avoid  ?Continue with pantoprazole as having improvement in epigastric pain ?Referral to allergist made ?

## 2021-10-27 NOTE — Assessment & Plan Note (Signed)
Continue Rx sent in for folic acid 1 mg once daily ?

## 2021-10-29 ENCOUNTER — Ambulatory Visit (INDEPENDENT_AMBULATORY_CARE_PROVIDER_SITE_OTHER): Payer: Self-pay

## 2021-10-29 ENCOUNTER — Other Ambulatory Visit: Payer: Self-pay

## 2021-10-29 DIAGNOSIS — D519 Vitamin B12 deficiency anemia, unspecified: Secondary | ICD-10-CM

## 2021-10-29 MED ORDER — CYANOCOBALAMIN 1000 MCG/ML IJ SOLN
1000.0000 ug | Freq: Once | INTRAMUSCULAR | Status: AC
  Administered 2021-10-29: 1000 ug via INTRAMUSCULAR

## 2021-10-29 NOTE — Progress Notes (Signed)
Pt came in today to receive 1 st vit b12 injection. Pt was given IM Vit B12 1,000 mcg/ml injection in right Deltoid successfully w/o any concerns. Pt has been scheduled for follow up injections.  ?

## 2021-11-06 ENCOUNTER — Ambulatory Visit (INDEPENDENT_AMBULATORY_CARE_PROVIDER_SITE_OTHER): Payer: Self-pay | Admitting: Cardiology

## 2021-11-06 ENCOUNTER — Encounter: Payer: Self-pay | Admitting: Cardiology

## 2021-11-06 VITALS — BP 97/74 | HR 87 | Ht 67.0 in | Wt 112.0 lb

## 2021-11-06 DIAGNOSIS — R55 Syncope and collapse: Secondary | ICD-10-CM
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
## 2021-11-06 NOTE — Patient Instructions (Addendum)
Medications: ?Your physician recommends that you continue on your current medications as directed. Please refer to the Current Medication list given to you today. ?*If you need a refill on your cardiac medications before your next appointment, please call your pharmacy* ? ?Lab Work: ?None. ?If you have labs (blood work) drawn today and your tests are completely normal, you will receive your results only by: ?MyChart Message (if you have MyChart) OR ?A paper copy in the mail ?If you have any lab test that is abnormal or we need to change your treatment, we will call you to review the results. ? ?Testing/Procedures: ?None. ? ?Follow-Up: ?At [facility], you and your health needs are our priority.  As part of our continuing mission to provide you with exceptional heart care, we have created designated Provider Care Teams.  These Care Teams include your primary Cardiologist (physician) and Advanced Practice Providers (APPs -  Physician Assistants and Nurse Practitioners) who all work together to provide you with the care you need, when you need it. ? ?Your physician wants you to follow-up in: As needed with [facility]  ? ? ?We recommend signing up for the patient portal called "MyChart".  Sign up information is provided on this After Visit Summary.  MyChart is used to connect with patients for Virtual Visits (Telemedicine).  Patients are able to view lab/test results, encounter notes, upcoming appointments, etc.  Non-urgent messages can be sent to your provider as well.   ?To learn more about what you can do with MyChart, go to [email or web address].   ? ?Any Other Special Instructions Will Be Listed Below (If Applicable). ?

## 2021-11-20 ENCOUNTER — Ambulatory Visit: Payer: Medicaid Other

## 2021-11-27 ENCOUNTER — Encounter: Payer: Self-pay | Admitting: Family Medicine

## 2021-11-27 ENCOUNTER — Ambulatory Visit: Payer: Self-pay | Admitting: Family Medicine

## 2021-11-27 DIAGNOSIS — Z113 Encounter for screening for infections with a predominantly sexual mode of transmission: Secondary | ICD-10-CM

## 2021-11-27 LAB — HM HIV SCREENING LAB: HM HIV Screening: NEGATIVE

## 2021-11-27 LAB — WET PREP FOR TRICH, YEAST, CLUE: Trichomonas Exam: NEGATIVE

## 2021-11-27 NOTE — Progress Notes (Signed)
Pt here for STD screening.  Wet mount results reviewed, no treatment required per SO.  Condoms given.  Chesnie Capell M Trevaughn Schear, RN  

## 2021-11-27 NOTE — Progress Notes (Signed)
[facility] Department ? ?STI clinic/screening visit ?[place] [address] ?[phone] ? ?Subjective:  ?Sonia Smith is a 41 y.o. female being seen today for an STI screening visit. The patient reports they do have symptoms.  Patient reports that they do not desire a pregnancy in the next year.   They reported they are not interested in discussing contraception today.   ? ?No LMP recorded. Patient has had a hysterectomy. ? ? ?Patient has the following medical conditions:   ?Patient Active Problem List  ? Diagnosis Date Noted  ? Allergic reaction to alpha-gal 10/27/2021  ? Other folate deficiency anemias 10/18/2021  ? Anemia due to vitamin B12 deficiency 10/18/2021  ? Tachycardia 10/16/2021  ? Abnormal weight loss 10/16/2021  ? Abnormal electrocardiogram (ECG) (EKG) 10/16/2021  ? Epigastric pain 10/16/2021  ? Diarrhea 10/16/2021  ? Syncope and collapse 10/16/2021  ? 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 Vitamin D deficiency 07/12/2021  ? 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 Hemorrhoids 06/11/2016  ? Chronic constipation 06/11/2016  ? Migraine with aura and without status migrainosus, not intractable 09/04/2014  ? ? ?Chief Complaint  ?Patient presents with  ? SEXUALLY TRANSMITTED DISEASE  ?  Screening  ? ? ?HPI ? ?Patient reports that she had "a little" external genital itching and labial irritation x 1-2 weeks.  States that last week it looked like the inner labia minor was peeling.  States that this occurred about I week after sexual activity with new partner.  States that her partner was tested prior to becoming sexually active with him.  She denies discharge, genital lesion,  abdominal pain ,  etc.  She has had a hysterectomy.  She is allergic to latex condoms.  Client states that her new partner has a short beard. States that when she was with her ex-husband that she developed an allergy to her husband's sperm  and is concerned this is the problem with new partner.   ? ?Last HIV  test per patient/review of record was 09/04/2020 ?Patient reports last pap was  2008 ? ?Screening for MPX risk: ?Does the patient have an unexplained rash? No ?Is the patient MSM? No ?Does the patient endorse multiple sex partners or anonymous sex partners? No ?Did the patient have close or sexual contact with a person diagnosed with MPX? No ?Has the patient traveled outside the [place] where MPX is endemic? No ?Is there a high clinical suspicion for MPX-- evidenced by one of the following No ? -Unlikely to be chickenpox ? -Lymphadenopathy ? -Rash that present in same phase of evolution on any given body part ?See flowsheet for further details and programmatic requirements.  ? ? ?The following portions of the patient's history were reviewed and updated as appropriate: allergies, current medications, past medical history, past social history, past surgical history and problem list. ? ?Objective:  ?There were no vitals filed for this visit. ? ?Physical Exam ?Vitals and nursing note reviewed.  ?Constitutional:   ?   Appearance: Normal appearance.  ?HENT:  ?   Head: Normocephalic and atraumatic.  ?   Mouth/Throat:  ?   Mouth: Mucous membranes are moist.  ?   Pharynx: Oropharynx is clear. No oropharyngeal exudate or posterior oropharyngeal erythema.  ?Pulmonary:  ?   Effort: Pulmonary effort is normal.  ?Abdominal:  ?   General: Abdomen is flat.  ?   Palpations: There is no mass.  ?   Tenderness: There is no abdominal tenderness. There is no rebound.  ?  Genitourinary: ?   General: Normal vulva.  ?   Exam position: Lithotomy position.  ?   Pubic Area: No rash or pubic lice.   ?   Labia:     ?   Right: No rash, tenderness or lesion.     ?   Left: No rash, tenderness or lesion.   ?   Vagina: Normal. No vaginal discharge, erythema, bleeding or lesions.  ?   Cervix: No cervical motion tenderness, discharge, friability, lesion or erythema.  ?   Uterus: Normal.   ?   Adnexa: Right adnexa normal and left adnexa normal.  ?   Rectum:  Normal.  ?   Comments: R/L inner labia minor noted to have irritation- no lesions, discharge ?Lymphadenopathy:  ?   Head:  ?   Right side of head: No preauricular or posterior auricular adenopathy.  ?   Left side of head: No preauricular or posterior auricular adenopathy.  ?   Cervical: No cervical adenopathy.  ?   Upper Body:  ?   Right upper body: No supraclavicular or axillary adenopathy.  ?   Left upper body: No supraclavicular or axillary adenopathy.  ?   Lower Body: No right inguinal adenopathy. No left inguinal adenopathy.  ?Skin: ?   General: Skin is warm and dry.  ?   Findings: No rash.  ?Neurological:  ?   Mental Status: She is alert and oriented to person, place, and time.  ? ? ? ?Assessment and Plan:  ?Sonia Smith is a 41 y.o. female presenting to the [facility] Department for STI screening ? ?1. Screening examination for venereal disease ? ?- WET PREP FOR TRICH, YEAST, CLUE ?- Gonococcus culture ?- Chlamydia/Gonorrhea [place] [facility] ?- HIV [place] [facility] ?- Syphilis Serology, [place] ? ?Co. To apply OTC diaper cream to inner labia to heal protect and heal area.  Discussed that possibly partner's beard is causing irritation with oral sex.  Discuss with him how to lessen the irritation. ? ? ?Return if symptoms worsen or fail to improve. ? ?Future Appointments  ?Date Time Provider [place]  ?12/03/2021  2:30 PM [facility] NURSE [facility]  ?01/07/2022  3:00 PM [facility] NURSE [facility]  ? ? ?Hassell Done, FNP ?

## 2021-12-01 LAB — GONOCOCCUS CULTURE

## 2021-12-02 ENCOUNTER — Encounter: Payer: Self-pay | Admitting: Family

## 2021-12-02 ENCOUNTER — Ambulatory Visit (INDEPENDENT_AMBULATORY_CARE_PROVIDER_SITE_OTHER): Payer: Medicaid Other | Admitting: Family

## 2021-12-02 VITALS — BP 99/58 | HR 79 | Temp 97.8°F | Resp 16 | Ht 67.0 in | Wt 113.5 lb

## 2021-12-02 DIAGNOSIS — E538 Deficiency of other specified B group vitamins: Secondary | ICD-10-CM

## 2021-12-02 DIAGNOSIS — R55 Syncope and collapse: Secondary | ICD-10-CM

## 2021-12-02 DIAGNOSIS — R350 Frequency of micturition: Secondary | ICD-10-CM | POA: Insufficient documentation

## 2021-12-02 DIAGNOSIS — N898 Other specified noninflammatory disorders of vagina: Secondary | ICD-10-CM | POA: Insufficient documentation

## 2021-12-02 LAB — POC URINALSYSI DIPSTICK (AUTOMATED)
Bilirubin, UA: NEGATIVE
Glucose, UA: NEGATIVE
Ketones, UA: NEGATIVE
Leukocytes, UA: NEGATIVE
Nitrite, UA: NEGATIVE
Protein, UA: POSITIVE — AB
Spec Grav, UA: >=1.03 — AB (ref 1.010–1.025)
Urobilinogen, UA: 0.2 E.U./dL
pH, UA: 5 (ref 5.0–8.0)

## 2021-12-02 MED ORDER — CYANOCOBALAMIN 1000 MCG/ML IJ SOLN
1000.0000 ug | Freq: Once | INTRAMUSCULAR | Status: AC
  Administered 2021-12-02: 1000 ug via INTRAMUSCULAR
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
## 2021-12-02 NOTE — Assessment & Plan Note (Signed)
pending results of recent std test ?Wet prep today, pending results for BV yeast ?

## 2021-12-02 NOTE — Patient Instructions (Signed)
Due to recent changes in healthcare laws, you may see results of your imaging and/or laboratory studies on MyChart before I have had a chance to review them.  I understand that in some cases there may be results that are confusing or concerning to you. Please understand that not all results are received at the same time and often I may need to interpret multiple results in order to provide you with the best plan of care or course of treatment. Therefore, I ask that you please give me 2 business days to thoroughly review all your results before contacting my office for clarification. Should we see a critical lab result, you will be contacted sooner.   It was a pleasure seeing you today! Please do not hesitate to reach out with any questions and or concerns.  Regards,   Shawnae Leiva FNP-C  

## 2021-12-02 NOTE — Assessment & Plan Note (Signed)
b12 administered in office ?Pt tolerated procedure well  ?Verbal consent obtained prior to administration  ? ?

## 2021-12-02 NOTE — Assessment & Plan Note (Signed)
Urine culture as well as poct urinalysis ordered pending results ?

## 2021-12-02 NOTE — Assessment & Plan Note (Addendum)
Reviewed notes from neurology and cardiology.  ?Stay hydrated, eat as able ?F/u with neuro and cardiology prn ?

## 2021-12-03 ENCOUNTER — Ambulatory Visit: Payer: Medicaid Other

## 2021-12-03 LAB — WET PREP BY MOLECULAR PROBE
Candida species: NOT DETECTED
Gardnerella vaginalis: NOT DETECTED
MICRO NUMBER:: 13333525
SPECIMEN QUALITY:: ADEQUATE
Trichomonas vaginosis: NOT DETECTED

## 2021-12-03 LAB — URINE CULTURE
MICRO NUMBER:: 13333526
SPECIMEN QUALITY:: ADEQUATE
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
## 2021-12-09 ENCOUNTER — Encounter: Payer: Self-pay | Admitting: Allergy

## 2021-12-09 ENCOUNTER — Ambulatory Visit: Payer: Self-pay | Admitting: Allergy

## 2021-12-09 VITALS — BP 100/60 | HR 75 | Temp 97.8°F | Resp 16 | Ht 67.0 in | Wt 115.2 lb

## 2021-12-09 DIAGNOSIS — Z91018 Allergy to other foods: Secondary | ICD-10-CM

## 2021-12-09 DIAGNOSIS — R198 Other specified symptoms and signs involving the digestive system and abdomen: Secondary | ICD-10-CM

## 2021-12-09 DIAGNOSIS — L299 Pruritus, unspecified: Secondary | ICD-10-CM

## 2021-12-09 DIAGNOSIS — Z8709 Personal history of other diseases of the respiratory system: Secondary | ICD-10-CM

## 2021-12-09 DIAGNOSIS — J3089 Other allergic rhinitis: Secondary | ICD-10-CM

## 2021-12-09 NOTE — Assessment & Plan Note (Signed)
Mild symptoms in the fall and spring.  Uses antihistamines as needed only with good benefit.   ?? Monitor symptoms. ?? Use over the counter antihistamines such as Zyrtec (cetirizine), Claritin (loratadine), Allegra (fexofenadine), or Xyzal (levocetirizine) daily as needed. May switch antihistamines every few months. ?

## 2021-12-09 NOTE — Assessment & Plan Note (Signed)
Itching with no triggers. H&H was elevated. Sometimes that can cause itching.  ?? See below for proper skin care.  ?? Recommend to recheck hemoglobin and hematocrit - patient is due for repeat bloodwork at PCP's office.  ?

## 2021-12-09 NOTE — Patient Instructions (Addendum)
Alpha gal allergy ?Start strict avoidance of all mammalian meat and its byproducts - see handout.  ?Epinephrine injectable device - demonstrated proper use. For mild symptoms you can take over the counter antihistamines such as Benadryl and monitor symptoms closely. If symptoms worsen or if you have severe symptoms including breathing issues, throat closure, significant swelling, whole body hives, severe diarrhea and vomiting, lightheadedness then inject epinephrine and seek immediate medical care afterwards. ?Emergency action plan given. ?Usually recheck every 6-12 months. ?Avoid future tick bites.  ? ?Skin ?See below for proper skin care.  ?Recommend to recheck hemoglobin and hematocrit as it was elevated the last time - sometimes that can cause itching.  ? ?GI symptoms: ?Skin prick testing and bloodwork (food IgE levels) check for IgE mediated reactions which some of your clinical presentation does not support.  ?Keep a food journal with symptoms and foods eaten. ?If there's a specific food that seems to trigger this, then we can skin test or get bloodwork to those specific foods in the future.  ?Follow up with GI.  ? ?Environmental allergies ?Monitor symptoms. ?Use over the counter antihistamines such as Zyrtec (cetirizine), Claritin (loratadine), Allegra (fexofenadine), or Xyzal (levocetirizine) daily as needed. May switch antihistamines every few months. ? ?History of asthma ?Monitor symptoms. ? ?Follow up in 4 months or sooner if needed.   ? ?Skin care recommendations ? ?Bath time: ?Always use lukewarm water. AVOID very hot or cold water. ?Keep bathing time to 5-10 minutes. ?Do NOT use bubble bath. ?Use a mild soap and use just enough to wash the dirty areas. ?Do NOT scrub skin vigorously.  ?After bathing, pat dry your skin with a towel. Do NOT rub or scrub the skin. ? ?Moisturizers and prescriptions:  ?ALWAYS apply moisturizers immediately after bathing (within 3 minutes). This helps to lock-in  moisture. ?Use the moisturizer several times a day over the whole body. ?Good summer moisturizers include: Aveeno, CeraVe, Cetaphil. ?Good winter moisturizers include: Aquaphor, Vaseline, Cerave, Cetaphil, Eucerin, Vanicream. ?When using moisturizers along with medications, the moisturizer should be applied about one hour after applying the medication to prevent diluting effect of the medication or moisturize around where you applied the medications. When not using medications, the moisturizer can be continued twice daily as maintenance. ? ?Laundry and clothing: ?Avoid laundry products with added color or perfumes. ?Use unscented hypo-allergenic laundry products such as Tide free, Cheer free & gentle, and All free and clear.  ?If the skin still seems dry or sensitive, you can try double-rinsing the clothes. ?Avoid tight or scratchy clothing such as wool. ?Do not use fabric softeners or dyer sheets.  ? ?

## 2021-12-09 NOTE — Assessment & Plan Note (Signed)
As a younger teen.  ?? Monitor symptoms. ?? Normal spirometry today. ?

## 2021-12-09 NOTE — Assessment & Plan Note (Addendum)
Patient had weight loss, nausea, vomiting, diarrhea. Treated with PPI for possible ulcer. No prior GI evaluation. ?? Recommend GI evaluation next given prolonged symptoms.  ?

## 2021-12-09 NOTE — Assessment & Plan Note (Signed)
Diagnosed with alpha gal in March 2023 via blood work.  For the past 1 year she noted weight loss, tachycardia with minimal exertion, rash, diarrhea and vomiting.  These symptoms have improved since eliminating red meat and dairy from her diet.  However still has some episodes of headaches, itching at times. Concerned about other allergic triggers. ?? Start strict avoidance of all mammalian meat and its byproducts - see handout.  ?? Epinephrine injectable device - demonstrated proper use. For mild symptoms you can take over the counter antihistamines such as Benadryl and monitor symptoms closely. If symptoms worsen or if you have severe symptoms including breathing issues, throat closure, significant swelling, whole body hives, severe diarrhea and vomiting, lightheadedness then inject epinephrine and seek immediate medical care afterwards. ?? Emergency action plan given. ?? Usually recheck every 6-12 months. ?? Avoid future tick bites.  ?  ?Discussed with patient at length that skin prick testing and bloodwork (food IgE levels) check for IgE mediated reactions which her clinical presentation does not support for the headaches and itching. With no food trigger noted.  ?Keep a food journal with symptoms and foods eaten. ?If there's a specific food that seems to trigger this, then we can skin test or get bloodwork to those specific foods. ?

## 2022-01-07 ENCOUNTER — Ambulatory Visit (INDEPENDENT_AMBULATORY_CARE_PROVIDER_SITE_OTHER): Payer: Self-pay

## 2022-01-07 DIAGNOSIS — E538 Deficiency of other specified B group vitamins: Secondary | ICD-10-CM

## 2022-01-07 MED ORDER — CYANOCOBALAMIN 1000 MCG/ML IJ SOLN
1000.0000 ug | Freq: Once | INTRAMUSCULAR | Status: AC
  Administered 2022-01-07: 1000 ug via INTRAMUSCULAR

## 2022-01-07 NOTE — Progress Notes (Signed)
Per orders of Eugenia Pancoast, FNP injection of B12 given in the left delotid by Kris Mouton. Patient tolerated injection well.

## 2022-01-23 ENCOUNTER — Ambulatory Visit: Payer: Medicaid Other | Admitting: Family

## 2022-02-13 ENCOUNTER — Emergency Department
Admission: EM | Admit: 2022-02-13 | Discharge: 2022-02-13 | Disposition: A | Discharge status: Home / Self Care | Payer: Medicaid Other | Attending: Emergency Medicine | Admitting: Emergency Medicine

## 2022-02-13 ENCOUNTER — Telehealth: Payer: Self-pay | Admitting: Family

## 2022-02-13 ENCOUNTER — Other Ambulatory Visit: Payer: Self-pay

## 2022-02-13 ENCOUNTER — Encounter: Payer: Self-pay | Admitting: Intensive Care

## 2022-02-13 DIAGNOSIS — S0501XA Injury of conjunctiva and corneal abrasion without foreign body, right eye, initial encounter: Secondary | ICD-10-CM

## 2022-02-13 DIAGNOSIS — X58XXXA Exposure to other specified factors, initial encounter: Secondary | ICD-10-CM | POA: Insufficient documentation

## 2022-02-13 MED ORDER — TETRACAINE HCL 0.5 % OP SOLN
1.0000 [drp] | Freq: Once | OPHTHALMIC | Status: AC
  Administered 2022-02-13: 1 [drp] via OPHTHALMIC
  Filled 2022-02-13: qty 4

## 2022-02-13 MED ORDER — FLUORESCEIN SODIUM 1 MG OP STRP
1.0000 | ORAL_STRIP | Freq: Once | OPHTHALMIC | Status: AC
  Administered 2022-02-13: 1 via OPHTHALMIC
  Filled 2022-02-13: qty 1

## 2022-02-13 MED ORDER — EYE WASH OP SOLN
1.0000 [drp] | OPHTHALMIC | Status: DC | PRN
  Filled 2022-02-13: qty 118

## 2022-02-13 MED ORDER — TOBRAMYCIN 0.3 % OP SOLN: 2.0000 [drp] | OPHTHALMIC | 0 refills | Status: DC

## 2022-02-13 NOTE — Telephone Encounter (Signed)
Patient called in saying that she got something in her eye last night,she flushed it out,but when she woke up her eye was red,pupils dilated, and blurry vision.

## 2022-02-13 NOTE — Discharge Instructions (Addendum)
Follow-up with [facility] if not improving in 24 to 36 hours.  Eyedrops were sent to your pharmacy to begin using every 4 hours while you are awake.  You do not have to get up and use them during the night.  Use sunglasses to protect your eyes from bright light.

## 2022-02-13 NOTE — ED Triage Notes (Signed)
Patient used cliradex towelettes on her right eye yesterday and caused burning. She then used two different eye drops to wash out her eye and woke up this morning with blurry right eye vision, drainage, and pain

## 2022-02-13 NOTE — Telephone Encounter (Signed)
Please see note under access nurse note; Claiborne Billings has spoken with pt and pt is on her way to ED. Sending note to Red Christians FNP.

## 2022-02-13 NOTE — Telephone Encounter (Signed)
Spoke to pt and she advised me that she is on the way to the ER now to get her eye checked out.

## 2022-02-13 NOTE — Telephone Encounter (Signed)
[facility] - Client TELEPHONE ADVICE RECORD [facility] Patient Name: Sonia Smith Gender: Female DOB: [birth year] Age: 41 Y 11 M 29 D Return Phone Number: [phone] (Primary) Address: City/ State/ Zip: [place] [address] Client [facility] - Client Client Site [facility] - Day Contact Type Call Who Is Calling Patient / Member / Family / Caregiver Call Type Triage / Clinical Relationship To Patient Self Return Phone Number [phone] (Primary) Chief Complaint EYE - something stuck (except contacts) or splashed in eye Reason for Call Symptomatic / Request for [place] states she got something in her eye and she flushed it out. Even after flushing it out her eyes are dilated, blurry vision and redness. Dr. Debarah Crape is the doctor, not listed, address confirmed. Translation No Nurse Assessment Nurse: Rolin Barry, RN, Levada Dy Date/Time Eilene Ghazi Time): 02/13/2022 11:22:37 AM Confirm and document reason for call. If symptomatic, describe symptoms. ---Caller states she got something in her eye and she flushed it out. Even after flushing it out her eyes are dilated, blurry vision and redness. Dr. Debarah Crape is the doctor, not listed, address confirmed. Caller stated that her eye lashes were falling out, followed the directions. Eye is burning. Does the patient have any new or worsening symptoms? ---Yes Will a triage be completed? ---Yes Related visit to physician within the last 2 weeks? ---No Does the PT have any chronic conditions? (i.e. diabetes, asthma, this includes High risk factors for pregnancy, etc.) ---Yes List chronic conditions. ---neuralgia alpha syndrome Is the patient pregnant or possibly pregnant? (Ask all females between the ages of 29-55) ---No Is this a behavioral health or substance abuse call? ---No Guidelines Guideline Title Affirmed Question  Affirmed Notes Nurse Date/Time (Eastern Time) Eye - Chemical In [1] Blurred vision AND [2] persists > 1 hour since flushing (regardless of [place], RN, Levada Dy 02/13/2022 11:25:03 AM PLEASE NOTE: All timestamps contained within this report are represented as [place] Standard Time. CONFIDENTIALTY NOTICE: This fax transmission is intended only for the addressee. It contains information that is legally privileged, confidential or otherwise protected from use or disclosure. If you are not the intended recipient, you are strictly prohibited from reviewing, disclosing, copying using or disseminating any of this information or taking any action in reliance on or regarding this information. If you have received this fax in error, please notify us immediately by telephone so that we can arrange for its return to [place]. Phone: [phone], Toll-Free: [phone], Fax: [phone] Page: 2 of 2 Call Id: [id number] Guidelines Guideline Title Affirmed Question Affirmed Notes Nurse Date/Time Eilene Ghazi Time) duration of flushing [irrigation]) Disp. Time Eilene Ghazi Time) Disposition Final User 02/13/2022 11:21:26 AM Send to Urgent Queue Benetta Spar 02/13/2022 11:27:42 AM Go to ED Now Yes Rolin Barry, RN, Levada Dy Final Disposition 02/13/2022 11:27:42 AM Go to ED Now Yes Deaton, RN, Levada Dy Disposition Overriden: Go to ED Now (or PCP triage) Override Reason: Patient's symptoms need a higher level of care Caller Disagree/Comply Comply Caller Understands Yes PreDisposition Did not know what to do Care Advice Given Per Guideline GO TO ED NOW: * You need to be seen in the Emergency Department. * Go to the ED at ___________ [place] now. Drive carefully. EYE IRRIGATION METHOD #1 - FLUSHING: REMOVE CONTACTS: CARE ADVICE given per Eye - Chemical in (Adult) guideline Referrals [place]

## 2022-02-13 NOTE — ED Provider Notes (Signed)
[facility] Provider Note    Event Date/Time   First MD Initiated Contact with Patient 02/13/22 1336     (approximate)   History   Eye Pain   HPI  Sonia Smith is a 41 y.o. female   presents to the ED with complaint of right arm discomfort that started after using a cliradex towelette to wipe her upper eyelid.  She states that soon after she used the towelette she began having some stinging and burning to her eye.  She irrigated her eye with some saline and over-the-counter eyedrops without any improvement.  She states that during the night her eye drained clear fluid but was not matted shut and no discoloration to the drainage.      Physical Exam   Triage Vital Signs: ED Triage Vitals  Enc Vitals Group     BP 02/13/22 1217 108/86     Pulse Rate 02/13/22 1217 93     Resp 02/13/22 1217 16     Temp 02/13/22 1217 98.4 F (36.9 C)     Temp Source 02/13/22 1217 Oral     SpO2 02/13/22 1217 99 %     Weight 02/13/22 1224 115 lb 4.8 oz (52.3 kg)     Height 02/13/22 1224 '5\' 7"'$  (1.702 m)     Head Circumference --      Peak Flow --      Pain Score 02/13/22 1224 5     Pain Loc --      Pain Edu? --      Excl. in [place]? --     Most recent vital signs: Vitals:   02/13/22 1217  BP: 108/86  Pulse: 93  Resp: 16  Temp: 98.4 F (36.9 C)  SpO2: 99%     General: Awake, no distress.  CV:  Good peripheral perfusion.  Resp:  Normal effort.  Abd:  No distention.  Other:  Minimal erythema/injection is noted to the right conjunctive.  No foreign body present.  PERRLA, EOMI's.  Tetracaine applied for examination and fluorescein stain showed a very faint corneal abrasion at approximately 3 to 7 o'clock position. Visual acuity left 20/13                     Right 20/50                     Both 20/25  ED Results / Procedures / Treatments   Labs (all labs ordered are listed, but only abnormal results are displayed) Labs Reviewed - No data to  display    PROCEDURES:  Critical Care performed:   Procedures   MEDICATIONS ORDERED IN ED: Medications  eye wash ((SODIUM/POTASSIUM/SOD CHLORIDE)) ophthalmic solution 1 drop (has no administration in time range)  tetracaine (PONTOCAINE) 0.5 % ophthalmic solution 1 drop (1 drop Right Eye Given by Other 02/13/22 1352)  fluorescein ophthalmic strip 1 strip (1 strip Left Eye Given 02/13/22 1352)     IMPRESSION / MDM / ASSESSMENT AND PLAN / ED COURSE  I reviewed the triage vital signs and the nursing notes.   Differential diagnosis includes, but is not limited to, corneal abrasion, allergic conjunctivitis, bacterial conjunctivitis.  41 year old female presents to the ED with complaint of right eye discomfort after using a towelette to her upper eyelid last evening.  On exam there is a faint corneal abrasion noted and patient was made aware.  Visual acuity was reviewed.  I discussed antibiotic eyedrops and using  sunglasses to protect her eye from bright light.  She is aware that if is not improving in the next 24 hours she is to follow-up with [facility].  Dr. Wallace Going listed on her discharge papers who is on-call for  eye.      Patient's presentation is most consistent with acute complicated illness / injury requiring diagnostic workup.  FINAL CLINICAL IMPRESSION(S) / ED DIAGNOSES   Final diagnoses:  Abrasion of right cornea, initial encounter     Rx / DC Orders   ED Discharge Orders          Ordered    tobramycin (TOBREX) 0.3 % ophthalmic solution  Every 4 hours        02/13/22 1416             Note:  This document was prepared using Dragon voice recognition software and may include unintentional dictation errors.   Johnn Hai, PA-C 02/13/22 1450    Delman Kitten, MD 02/13/22 1510

## 2022-02-14 NOTE — Telephone Encounter (Signed)
Yes thank you  Agree with recommendations for more urgent care.

## 2022-03-06 IMAGING — MG MM DIGITAL SCREENING BILAT W/ TOMO AND CAD
6 of 12 series · 6 of 36 positions shown · non-contrast
Comparison: Previous exam(s).

CLINICAL DATA: Screening.

EXAM:
DIGITAL SCREENING BILATERAL MAMMOGRAM WITH TOMOSYNTHESIS AND CAD
TECHNIQUE: Bilateral screening digital craniocaudal and mediolateral oblique
mammograms were obtained. Bilateral screening digital breast
tomosynthesis was performed. The images were evaluated with
computer-aided detection.

[R CC synth-2D (1 of 2)]
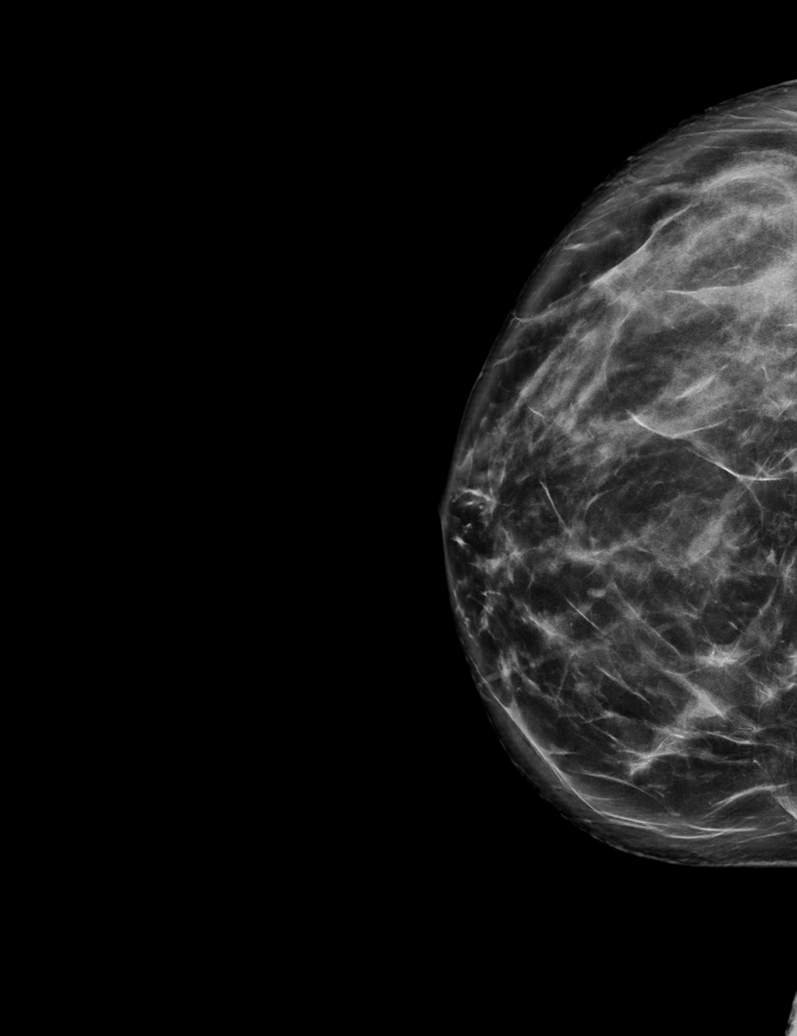

[L CC synth-2D (1 of 2)]
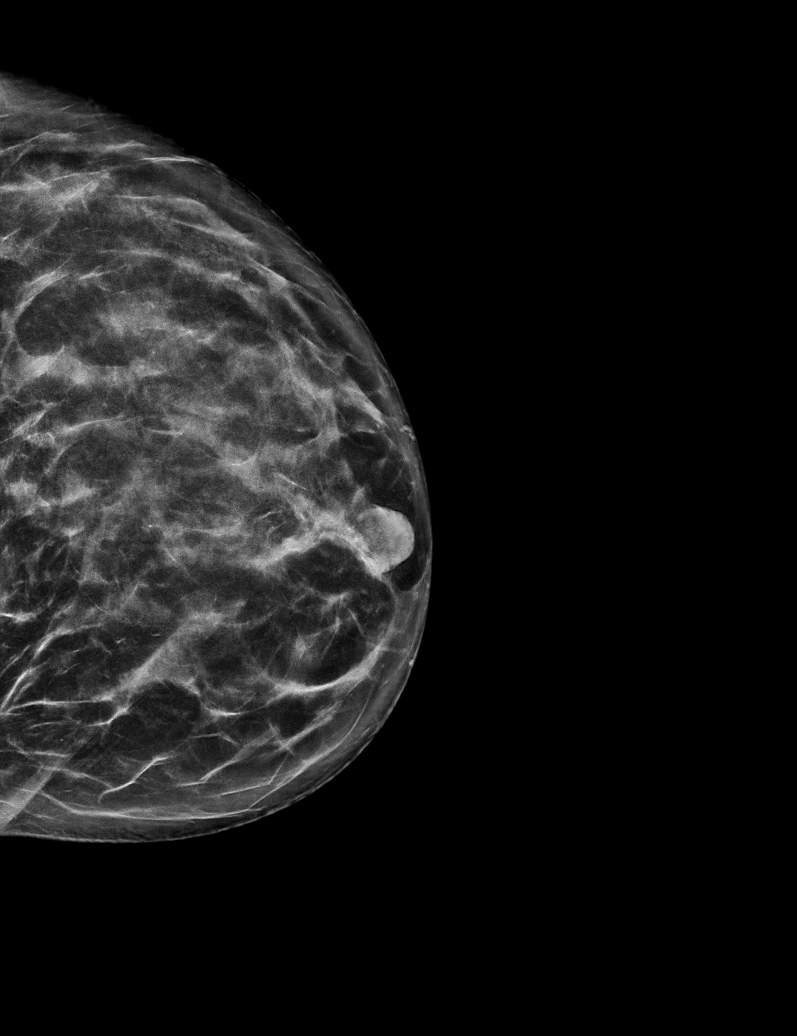

[R CC synth-2D (2 of 2)]
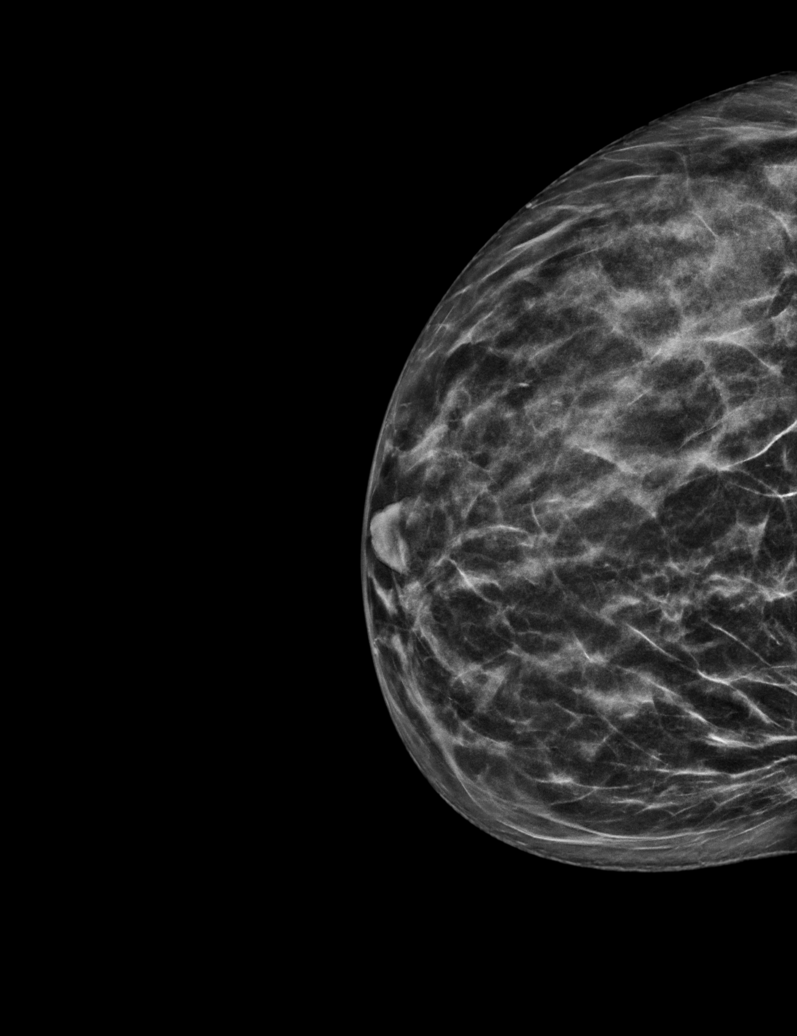

[L MLO synth-2D]
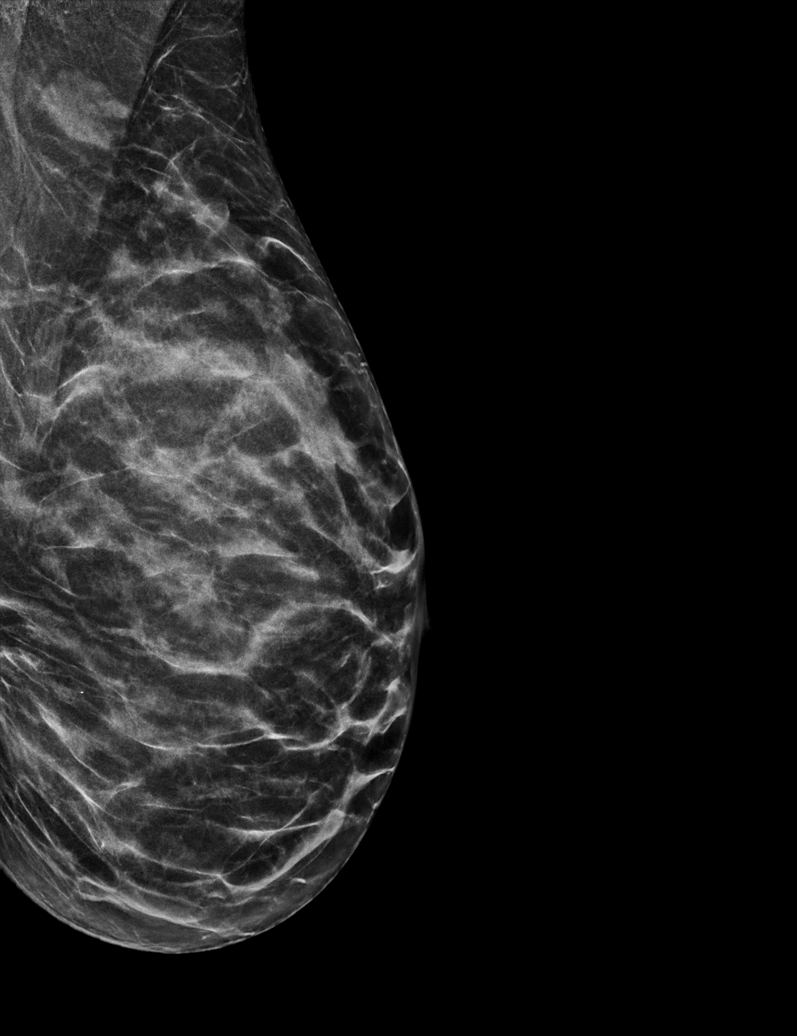

[L CC synth-2D (2 of 2)]
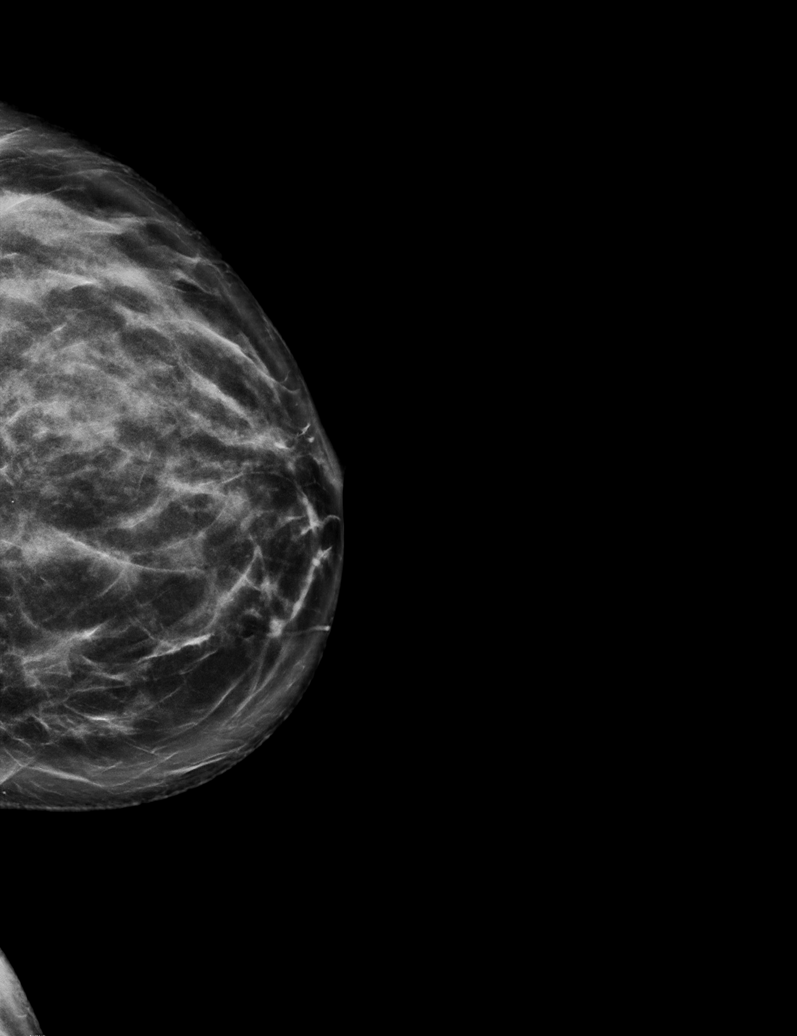

[R MLO synth-2D]
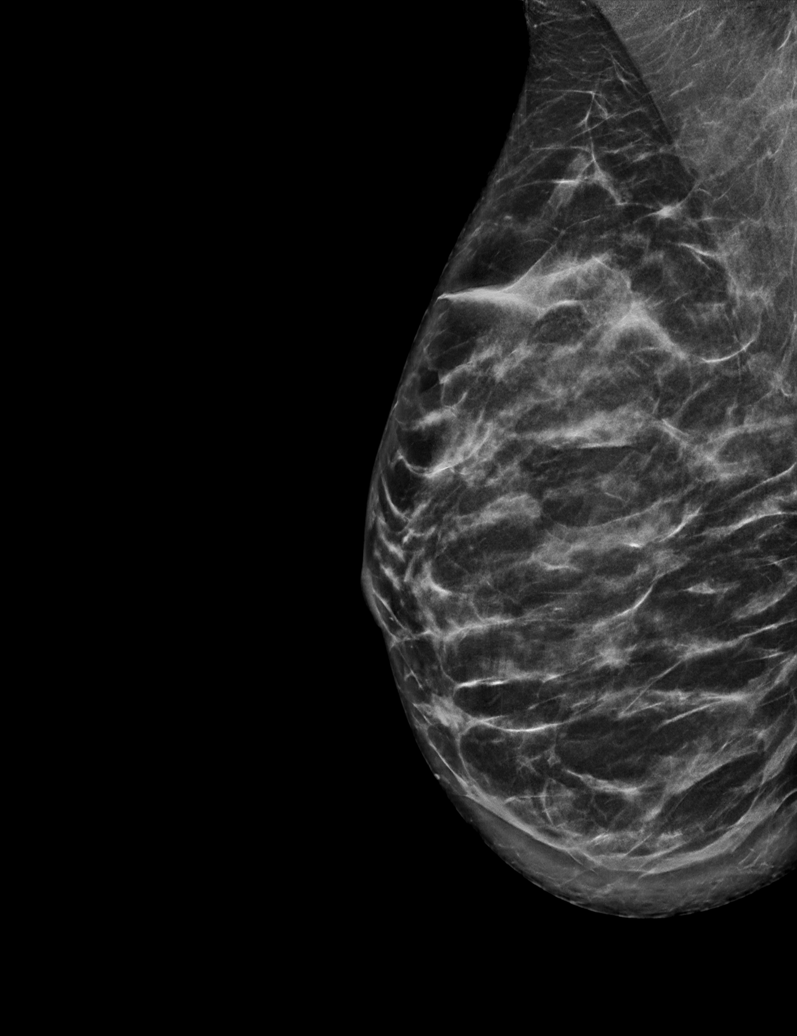

[6 of 36 positions shown; findings below may reference images not displayed]

ACR Breast Density Category c: The breast tissue is heterogeneously
dense, which may obscure small masses.
FINDINGS: There are no findings suspicious for malignancy.
IMPRESSION: No mammographic evidence of malignancy. A result letter of this
screening mammogram will be mailed directly to the patient.

RECOMMENDATION:
Screening mammogram in one year. (Code:Q3-W-BC3)

BI-RADS CATEGORY  1: Negative.

## 2022-04-14 ENCOUNTER — Ambulatory Visit: Payer: Medicaid Other | Admitting: Allergy

## 2022-04-17 ENCOUNTER — Ambulatory Visit (INDEPENDENT_AMBULATORY_CARE_PROVIDER_SITE_OTHER): Payer: Self-pay | Admitting: Family

## 2022-04-17 VITALS — BP 100/60 | HR 88 | Temp 98.6°F | Ht 67.0 in | Wt 114.0 lb

## 2022-04-17 DIAGNOSIS — R300 Dysuria: Secondary | ICD-10-CM

## 2022-04-17 DIAGNOSIS — N3001 Acute cystitis with hematuria: Secondary | ICD-10-CM | POA: Insufficient documentation

## 2022-04-17 LAB — URINALYSIS, ROUTINE W REFLEX MICROSCOPIC
Bilirubin Urine: NEGATIVE
Ketones, ur: NEGATIVE
Leukocytes,Ua: NEGATIVE
Nitrite: NEGATIVE
RBC / HPF: NONE SEEN (ref 0–?)
Specific Gravity, Urine: 1.025 (ref 1.000–1.030)
Total Protein, Urine: NEGATIVE
Urine Glucose: NEGATIVE
Urobilinogen, UA: 0.2 (ref 0.0–1.0)
WBC, UA: NONE SEEN (ref 0–?)
pH: 5.5 (ref 5.0–8.0)

## 2022-04-17 LAB — POC URINALSYSI DIPSTICK (AUTOMATED)
Bilirubin, UA: NEGATIVE
Glucose, UA: NEGATIVE
Ketones, UA: NEGATIVE
Leukocytes, UA: NEGATIVE
Nitrite, UA: NEGATIVE
Protein, UA: NEGATIVE
Spec Grav, UA: 1.02 (ref 1.010–1.025)
Urobilinogen, UA: 0.2 E.U./dL
pH, UA: 5 (ref 5.0–8.0)

## 2022-04-17 MED ORDER — CIPROFLOXACIN HCL 500 MG PO TABS: 500.0000 mg | ORAL_TABLET | Freq: Two times a day (BID) | ORAL | 0 refills | Status: AC
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
## 2022-04-17 NOTE — Assessment & Plan Note (Signed)
Ordered poct dipstick in office, suspected UTI UA and urine culture ordered pending results.  Choosing to treat due to symptoms.  Rx ciprofloxacin 500 mg po bid x 7 days Encouraged increased water intake throughout the day. Did advise pt if fever/chills/worsening fatigue pt to go to er for possible pyelonephritis.

## 2022-04-17 NOTE — Patient Instructions (Signed)
Due to recent changes in healthcare laws, you may see results of your imaging and/or laboratory studies on MyChart before I have had a chance to review them.  I understand that in some cases there may be results that are confusing or concerning to you. Please understand that not all results are received at the same time and often I may need to interpret multiple results in order to provide you with the best plan of care or course of treatment. Therefore, I ask that you please give me 2 business days to thoroughly review all your results before contacting my office for clarification. Should we see a critical lab result, you will be contacted sooner.   It was a pleasure seeing you today! Please do not hesitate to reach out with any questions and or concerns.  Regards,   Ayona Yniguez FNP-C  

## 2022-04-18 LAB — URINE CULTURE
MICRO NUMBER:: 13917966
Result:: NO GROWTH
SPECIMEN QUALITY:: ADEQUATE

## 2022-06-02 ENCOUNTER — Ambulatory Visit: Payer: Self-pay | Admitting: Nurse Practitioner

## 2022-06-02 DIAGNOSIS — Z113 Encounter for screening for infections with a predominantly sexual mode of transmission: Secondary | ICD-10-CM

## 2022-06-02 LAB — HM HEPATITIS C SCREENING LAB: HM Hepatitis Screen: NEGATIVE

## 2022-06-02 LAB — WET PREP FOR TRICH, YEAST, CLUE
Trichomonas Exam: NEGATIVE
Yeast Exam: NEGATIVE

## 2022-06-02 LAB — HM HIV SCREENING LAB: HM HIV Screening: NEGATIVE

## 2022-06-02 NOTE — Progress Notes (Unsigned)
Pt appointment for STI screening. Seen by FNP White. Initial lab results reviewed with pt.
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
## 2022-07-11 ENCOUNTER — Telehealth: Payer: Self-pay | Admitting: Family

## 2022-07-11 NOTE — Telephone Encounter (Signed)
Agree with precautions given to pt  Agree with nurse assessment in plan.  Thank you for speaking with them. 

## 2022-07-11 NOTE — Telephone Encounter (Signed)
Per access note pt was advised to be seen within 24 hrs. Per access note pt was advised to go to [facility] and pt agreed to comply in access note. Pt last saw OB GYN 06/18/22 per chart review.no available appts at [facility] or [facility]. Sending note to Red Christians FNP and [facility].

## 2022-07-11 NOTE — Telephone Encounter (Signed)
[facility] - Client TELEPHONE ADVICE RECORD AccessNurse Patient Name: Sonia Smith Gender: Female DOB: [birth year] Age: 41 Y 35 M 24 D Return Phone Number: [phone] (Primary) Address: City/ State/ Zip: [place] [address] Client [facility] - Client Client Site [place] - Day Provider AA - PHYSICIAN, NOT LISTED- MD Contact Type Call Who Is Calling Patient / Member / Family / Caregiver Call Type Triage / Clinical Relationship To Patient Self Return Phone Number [phone] (Primary) Chief Complaint Vaginal Pain Reason for Call Symptomatic / Request for [place] states she is having vaginal burning, swelling and redness states it happened after intercourse.States it has happen before but unsure why. States she is unsure if she is allergic to sperm. Translation No Nurse Assessment Nurse: Radford Pax, RN, Eugene Garnet Date/Time Eilene Ghazi Time): 07/11/2022 11:28:28 AM Confirm and document reason for call. If symptomatic, describe symptoms. ---Vaginal pain, swelling, itching. Does the patient have any new or worsening symptoms? ---Yes Will a triage be completed? ---Yes Related visit to physician within the last 2 weeks? ---No Does the PT have any chronic conditions? (i.e. diabetes, asthma, this includes High risk factors for pregnancy, etc.) ---No Is the patient pregnant or possibly pregnant? (Ask all females between the ages of 46-55) ---No Is this a behavioral health or substance abuse call? ---No Guidelines Guideline Title Affirmed Question Affirmed Notes Nurse Date/Time (Eastern Time) Vaginal Symptoms MODERATESEVERE itching (i.e., interferes with school, work, or sleep) Radford Pax, RN, Eugene Garnet 07/11/2022 11:31:21 AM Disp. Time Eilene Ghazi Time) Disposition Final User 07/11/2022 11:38:18 AM See PCP within 24 Hours Yes Turner, RN, Eugene Garnet PLEASE NOTE: All timestamps  contained within this report are represented as [place] Standard Time. CONFIDENTIALTY NOTICE: This fax transmission is intended only for the addressee. It contains information that is legally privileged, confidential or otherwise protected from use or disclosure. If you are not the intended recipient, you are strictly prohibited from reviewing, disclosing, copying using or disseminating any of this information or taking any action in reliance on or regarding this information. If you have received this fax in error, please notify us immediately by telephone so that we can arrange for its return to [place]. Phone: [phone], Toll-Free: [phone], Fax: [phone] Page: 2 of 2 Call Id: [id number] Final Disposition 07/11/2022 11:38:18 AM See PCP within 24 Hours Yes Radford Pax, RN, Sharion Settler Disagree/Comply Comply Caller Understands Yes PreDisposition Call Doctor Care Advice Given Per Guideline SEE PCP WITHIN 24 HOURS: * IF OFFICE WILL BE OPEN: You need to be examined within the next 24 hours. Call your doctor (or NP/PA) when the office opens and make an appointment. ANTIHISTAMINE MEDICINES FOR SEVERE ITCHING: * For severe itching, you can take either cetirizine, fexofenadine, or loratadine. CALL BACK IF: * Fever or severe pain * You become worse CARE ADVICE given per Vaginal Symptoms (Adult) guideline. Referrals [facility]
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
## 2022-07-11 NOTE — Telephone Encounter (Signed)
Noted  

## 2022-09-30 DIAGNOSIS — Z86718 Personal history of other venous thrombosis and embolism: Secondary | ICD-10-CM | POA: Insufficient documentation

## 2022-10-07 ENCOUNTER — Ambulatory Visit: Payer: Medicaid Other | Admitting: Family

## 2022-11-04 ENCOUNTER — Ambulatory Visit: Payer: Medicaid Other | Admitting: Dermatology

## 2022-11-04 VITALS — BP 100/69 | HR 71

## 2022-11-04 DIAGNOSIS — L578 Other skin changes due to chronic exposure to nonionizing radiation: Secondary | ICD-10-CM

## 2022-11-04 DIAGNOSIS — D492 Neoplasm of unspecified behavior of bone, soft tissue, and skin: Secondary | ICD-10-CM | POA: Diagnosis not present

## 2022-11-04 NOTE — Progress Notes (Signed)
Follow-Up Visit   Subjective  Sonia Smith is a 42 y.o. female who presents for the following: Spot. Left eyebrow. Dur: ~4 months. Growing. Hurts secondary to trauma. Also concerned about spot on right cheek. Dur: 1 month.  The patient has spots, moles and lesions to be evaluated, some may be new or changing and the patient has concerns that these could be cancer.  The following portions of the chart were reviewed this encounter and updated as appropriate: medications, allergies, medical history  Review of Systems:  No other skin or systemic complaints except as noted in HPI or Assessment and Plan.  Objective  Well appearing patient in no apparent distress; mood and affect are within normal limits.  A focused examination was performed of the following areas: Face, left eyebrow.  Relevant physical exam findings are noted in the Assessment and Plan.  Left lateral eyebrow 0.6 cm firm mobile subq nodule    Assessment & Plan   Neoplasm of skin Left lateral eyebrow  Favor cyst, bothersome. Plan excision with pathology.   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 Call for changes.  - Watch for changes.  MELASMA Exam: reticulated hyperpigmented patches at face  Chronic and persistent condition with duration or expected duration over one year. Condition is bothersome/symptomatic for patient. Currently flared.  Resolved in the past with Tri-[facility]uma.   Melasma is a chronic; persistent condition of hyperpigmented patches generally on the face, worse in summer due to higher UV exposure.    Heredity; thyroid disease; sun exposure; pregnancy; birth control pills; epilepsy medication and darker skin may predispose to Melasma.   Recommendations include: - Sun avoidance and daily broad spectrum (UVA/UVB) Tinted Mineral (Zinc and/or  titanium dioxide) sunscreen SPF 30+ - Rx topical bleaching creams (i.e. hydroquinone) is a common treatment but should not be used long term.  Hydroquinones may be mixed with retinoids; steroids; Kojic Acid. - Rx Azelaic Acid is also a treatment option that is safe for pregnancy (Category B). - OTC Heliocare can be helpful in control and prevention. 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 - Chemical peels (would need multiple for best result).  - Lasers and  Microdermabrasion may also be helpful adjunct treatments.  Treatment Plan: Will prescribe [facility] Hydroquinone 12%/kojic acid/vitamin C cream pea sized amount twice daily to the entire face for up to 3 months. This cannot be used more than 3 months due to risk of exogenous ochronosis (permanent dark spots). The patient was advised this is not covered by insurance since it is made by a compounding pharmacy. They will receive an email to check out and the medication will be mailed to their home.     Instructions for [facility] Medications  One or more of your medications was sent to the [facility] mail order compounding pharmacy. You will receive an email from them and can purchase the medicine through that link. It will then be mailed to your home at the address you confirmed. If for any reason you do not receive an email from them, please check your spam folder. If you still do not find the email, please let us know. [facility] phone number is [phone].         ACTINIC DAMAGE - chronic, secondary to cumulative UV radiation exposure/sun exposure over time - diffuse scaly erythematous macules with underlying  dyspigmentation - Recommend daily broad spectrum sunscreen SPF 30+ to sun-exposed areas, reapply every 2 hours as needed.  - Recommend staying in the shade or wearing long sleeves, sun glasses (UVA+UVB protection) and wide brim hats (4-inch brim around the entire circumference of the  hat). - Call for new or changing lesions.   Return for Cyst Excision, Next Available, Melasma Follow Up in 4- 6 weeks.  I, Sonia Smith, CMA, am acting as scribe for Sonia DatesVIRGINA Manasseh Pittsley, MD.   Documentation: I have reviewed the above documentation for accuracy and completeness, and I agree with the above.  Sonia DatesVIRGINA Demia Viera, MD

## 2022-11-04 NOTE — Patient Instructions (Addendum)
Pre-Operative Instructions  You are scheduled for a surgical procedure at [facility]. We recommend you read the following instructions. If you have any questions or concerns, please call the office at [phone].  Shower and wash the entire body with soap and water the day of your surgery paying special attention to cleansing at and around the planned surgery site.  Avoid aspirin or aspirin containing products at least fourteen (14) days prior to your surgical procedure and for at least one week (7 Days) after your surgical procedure. If you take aspirin on a regular basis for heart disease or history of stroke or for any other reason, we may recommend you continue taking aspirin but please notify us if you take this on a regular basis. Aspirin can cause more bleeding to occur during surgery as well as prolonged bleeding and bruising after surgery.   Avoid other nonsteroidal pain medications at least one week prior to surgery and at least one week prior to your surgery. These include medications such as Ibuprofen (Motrin, Advil and Nuprin), Naprosyn, Voltaren, Relafen, etc. If medications are used for therapeutic reasons, please inform us as they can cause increased bleeding or prolonged bleeding during and bruising after surgical procedures.   Please advise [place] if you are taking any "blood thinner" medications such as Coumadin or Dipyridamole or Plavix or similar medications. These cause increased bleeding and prolonged bleeding during procedures and bruising after surgical procedures. We may have to consider discontinuing these medications briefly prior to and shortly after your surgery if safe to do so.   Please inform us of all medications you are currently taking. All medications that are taken regularly should be taken the day of surgery as you always do. Nevertheless, we need to be informed of what medications you are taking prior to surgery to know whether they will affect the  procedure or cause any complications.   Please inform us of any medication allergies. Also inform us of whether you have allergies to Latex or rubber products or whether you have had any adverse reaction to Lidocaine or Epinephrine.  Please inform us of any prosthetic or artificial body parts such as artificial heart valve, joint replacements, etc., or similar condition that might require preoperative antibiotics.   We recommend avoidance of alcohol at least two weeks prior to surgery and continued avoidance for at least two weeks after surgery.   We recommend discontinuation of tobacco smoking at least two weeks prior to surgery and continued abstinence for at least two weeks after surgery.  Do not plan strenuous exercise, strenuous work or strenuous lifting for approximately four weeks after your surgery.   We request if you are unable to make your scheduled surgical appointment, please call us at least a week in advance or as soon as you are aware of a problem so that we can cancel or reschedule the appointment.   You MAY TAKE TYLENOL (acetaminophen) for pain as it is not a blood thinner.   PLEASE PLAN TO BE IN TOWN FOR TWO WEEKS FOLLOWING SURGERY, THIS IS IMPORTANT SO YOU CAN BE CHECKED FOR DRESSING CHANGES, SUTURE REMOVAL AND TO MONITOR FOR POSSIBLE COMPLICATIONS.    Will prescribe [facility] Hydroquinone 12%/kojic acid/vitamin C cream pea sized amount twice daily to the entire face for up to 3 months. This cannot be used more than 3 months due to risk of exogenous ochronosis (permanent dark spots). The patient was advised this is not covered by insurance since it is  made by a compounding pharmacy. They will receive an email to check out and the medication will be mailed to their home.     Instructions for [facility] Medications  One or more of your medications was sent to the [facility] mail order compounding pharmacy. You will receive an email from them and can purchase  the medicine through that link. It will then be mailed to your home at the address you confirmed. If for any reason you do not receive an email from them, please check your spam folder. If you still do not find the email, please let us know. [facility] phone number is [phone].     Recommend taking Heliocare Ultra sun protection supplement daily in sunny weather for additional sun protection. For maximum protection on the sunniest days, you can take up to 2 capsules of regular Heliocare OR take 1 capsule of Heliocare Ultra. For prolonged exposure (such as a full day in the sun), you can repeat your dose of the supplement 4 hours after your first dose. Heliocare can be purchased at [employer], at some [facility] or at [email or web address].    Recommend tinted mineral sunscreen daily.      Due to recent changes in healthcare laws, you may see results of your pathology and/or laboratory studies on MyChart before the doctors have had a chance to review them. We understand that in some cases there may be results that are confusing or concerning to you. Please understand that not all results are received at the same time and often the doctors may need to interpret multiple results in order to provide you with the best plan of care or course of treatment. Therefore, we ask that you please give [place] 2 business days to thoroughly review all your results before contacting the office for clarification. Should we see a critical lab result, you will be contacted sooner.   If You Need Anything After Your Visit  If you have any questions or concerns for your doctor, please call our main line at [phone] and press option 4 to reach your doctor's medical assistant. If no one answers, please leave a voicemail as directed and we will return your call as soon as possible. Messages left after 4 pm will be answered the following business day.   You may also send [place] a message via [place]. We typically  respond to MyChart messages within 1-2 business days.  For prescription refills, please ask your pharmacy to contact our office. Our fax number is [phone].  If you have an urgent issue when the clinic is closed that cannot wait until the next business day, you can page your doctor at the number below.    Please note that while we do our best to be available for urgent issues outside of office hours, we are not available 24/7.   If you have an urgent issue and are unable to reach [place], you may choose to seek medical care at your doctor's office, retail clinic, urgent care center, or emergency room.  If you have a medical emergency, please immediately call 911 or go to the emergency department.  Pager Numbers  - Dr. Nehemiah Massed: [phone]  - Dr. Laurence Ferrari: [phone]  - Dr. Nicole Kindred: [phone]  In the event of inclement weather, please call our main line at [phone] for an update on the status of any delays or closures.  Dermatology Medication Tips: Please keep the boxes that topical medications come in in order to help keep track  of the instructions about where and how to use these. Pharmacies typically print the medication instructions only on the boxes and not directly on the medication tubes.   If your medication is too expensive, please contact our office at [phone] option 4 or send [place] a message through [place].   We are unable to tell what your co-pay for medications will be in advance as this is different depending on your insurance coverage. However, we may be able to find a substitute medication at lower cost or fill out paperwork to get insurance to cover a needed medication.   If a prior authorization is required to get your medication covered by your insurance company, please allow [place] 1-2 business days to complete this process.  Drug prices often vary depending on where the prescription is filled and some pharmacies may offer cheaper prices.  The website  www.goodrx.com contains coupons for medications through different pharmacies. The prices here do not account for what the cost may be with help from insurance (it may be cheaper with your insurance), but the website can give you the price if you did not use any insurance.  - You can print the associated coupon and take it with your prescription to the pharmacy.  - You may also stop by our office during regular business hours and pick up a GoodRx coupon card.  - If you need your prescription sent electronically to a different pharmacy, notify our office through [facility] or by phone at [phone] option 4.     Si Usted Necesita Algo Despus de Su Visita  Tambin puede enviarnos un mensaje a travs de Pharmacist, community. Por lo general respondemos a los mensajes de MyChart en el transcurso de 1 a 2 das hbiles.  Para renovar recetas, por favor pida a su farmacia que se ponga en contacto con nuestra oficina. Harland Dingwall de fax es [place] [phone].  Si tiene un asunto urgente cuando la clnica est cerrada y que no puede esperar hasta el siguiente da hbil, puede llamar/localizar a su doctor(a) al nmero que aparece a continuacin.   Por favor, tenga en cuenta que aunque hacemos todo lo posible para estar disponibles para asuntos urgentes fuera del horario de [place], no estamos disponibles las 24 horas del da, los 7 das de la [place].   Si tiene un problema urgente y no puede comunicarse con nosotros, puede optar por buscar atencin mdica  en el consultorio de su doctor(a), en una clnica privada, en un centro de atencin urgente o en una sala de emergencias.  Si tiene Engineering geologist, por favor llame inmediatamente al 911 o vaya a la sala de emergencias.  Nmeros de bper  - Dr. Nehemiah Massed: [phone]  - Dra. Moye: [phone]  - Dra. Nicole Kindred: [phone]  En caso de inclemencias del [place], por favor llame a Johnsie Kindred principal al [phone] para una actualizacin  sobre el [place] de cualquier retraso o cierre.  Consejos para la medicacin en dermatologa: Por favor, guarde las cajas en las que vienen los medicamentos de uso tpico para ayudarle a seguir las instrucciones sobre dnde y cmo usarlos. Las farmacias generalmente imprimen las instrucciones del medicamento slo en las cajas y no directamente en los tubos del [place].   Si su medicamento es muy caro, por favor, pngase en contacto con Zigmund Daniel llamando al [phone] y presione la opcin 4 o envenos un mensaje a travs de Pharmacist, community.   No podemos decirle cul ser su copago por los medicamentos por  adelantado ya que esto es diferente dependiendo de la cobertura de su seguro. Sin embargo, es posible que podamos encontrar un medicamento sustituto a Electrical engineer un formulario para que el seguro cubra el medicamento que se considera necesario.   Si se requiere una autorizacin previa para que su compaa de seguros [place] su medicamento, por favor permtanos de 1 a 2 das hbiles para completar este proceso.  Los precios de los medicamentos varan con frecuencia dependiendo del Environmental consultant de dnde se surte la receta y alguna farmacias pueden ofrecer precios ms baratos.  El sitio web www.goodrx.com tiene cupones para medicamentos de Airline pilot. Los precios aqu no tienen en cuenta lo que podra costar con la ayuda del seguro (puede ser ms barato con su seguro), pero el sitio web puede darle el precio si no utiliz Research scientist (physical sciences).  - Puede imprimir el cupn correspondiente y llevarlo con su receta a la farmacia.  - Tambin puede pasar por nuestra oficina durante el horario de atencin regular y Charity fundraiser una tarjeta de cupones de GoodRx.  - Si necesita que su receta se enve electrnicamente a una farmacia diferente, informe a nuestra oficina a travs de MyChart de  o por telfono llamando al [phone] y presione la opcin 4.

## 2022-11-11 ENCOUNTER — Ambulatory Visit (INDEPENDENT_AMBULATORY_CARE_PROVIDER_SITE_OTHER): Payer: Medicaid Other | Admitting: Dermatology

## 2022-11-11 VITALS — BP 105/70 | HR 76

## 2022-11-11 DIAGNOSIS — L720 Epidermal cyst: Secondary | ICD-10-CM | POA: Diagnosis not present

## 2022-11-11 DIAGNOSIS — D492 Neoplasm of unspecified behavior of bone, soft tissue, and skin: Secondary | ICD-10-CM

## 2022-11-11 MED ORDER — MUPIROCIN 2 % EX OINT: 1.0000 | TOPICAL_OINTMENT | Freq: Every day | CUTANEOUS | 0 refills | Status: DC

## 2022-11-11 NOTE — Progress Notes (Signed)
   Follow-Up Visit   Subjective  Sonia Smith is a 42 y.o. female who presents for the following: Procedure (Excision ).   The following portions of the chart were reviewed this encounter and updated as appropriate:   Tobacco  Allergies  Meds  Problems  Med Hx  Surg Hx  Fam Hx      Review of Systems:  No other skin or systemic complaints except as noted in HPI or Assessment and Plan.  Objective  Well appearing patient in no apparent distress; mood and affect are within normal limits.  A focused examination was performed including face. Relevant physical exam findings are noted in the Assessment and Plan.  left lateral brow 0.6 cm firm mobile subq nodule     Assessment & Plan  Neoplasm of skin left lateral brow  Skin excision  Lesion length (cm):  0.6 Lesion width (cm):  0.6 Total excision diameter (cm):  0.6 Informed consent: discussed and consent obtained   Timeout: patient name, date of birth, surgical site, and procedure verified   Procedure prep:  Patient was prepped and draped in usual sterile fashion Prep type:  Chlorhexidine Anesthesia: the lesion was anesthetized in a standard fashion   Anesthetic:  1% lidocaine w/ epinephrine 1-100,000 buffered w/ 8.4% NaHCO3 (1.3 cc lido w/epi) Instrument used comment:  15c Hemostasis achieved with: pressure and electrodesiccation    Skin repair Complexity:  Intermediate Final length (cm):  1 Informed consent: discussed and consent obtained   Timeout: patient name, date of birth, surgical site, and procedure verified   Procedure prep:  Patient was prepped and draped in usual sterile fashion Prep type:  Chlorhexidine Anesthesia: the lesion was anesthetized in a standard fashion   Anesthetic:  1% lidocaine w/ epinephrine 1-100,000 local infiltration Reason for type of repair: reduce tension to allow closure, reduce the risk of dehiscence, infection, and necrosis, preserve normal anatomy and preserve normal  anatomical and functional relationships   Undermining: edges undermined   Subcutaneous layers (deep stitches):  Suture size:  5-0 Suture type: Vicryl (polyglactin 910)   Fine/surface layer approximation (top stitches):  Suture size:  5-0 Suture type: Prolene (polypropylene)   Suture removal (days):  7 Hemostasis achieved with: suture, pressure and electrodesiccation Outcome: patient tolerated procedure well with no complications   Post-procedure details: wound care instructions given   Additional details:  Mupirocin and a pressure dressing applied  Specimen 1 - Surgical pathology Differential Diagnosis: r/o Cyst vs Other  Check Margins: No 0.6 cm firm mobile subq nodule   Return in about 1 week (around 11/18/2022) for suture removal.  Anise Salvo, RMA, am acting as scribe for Darden Dates, MD .  Documentation: I have reviewed the above documentation for accuracy and completeness, and I agree with the above.  Darden Dates, MD

## 2022-11-11 NOTE — Patient Instructions (Addendum)
Wound Care Instructions for After Surgery  On the day following your surgery, you should begin doing daily dressing changes until your sutures are removed: Remove the bandage. Cleanse the wound gently with soap and water.  Make sure you then dry the skin surrounding the wound completely or the tape will not stick to the skin. Do not use cotton balls on the wound. After the wound is clean and dry, apply the ointment (either prescription antibiotic prescribed by your doctor or plain Vaseline if nothing was prescribed) gently with a Q-tip. If you are using a bandaid to cover: Apply a bandaid large enough to cover the entire wound. If you do not have a bandaid large enough to cover the wound OR if you are sensitive to bandaid adhesive: Cut a non-stick pad (such as Telfa) to fit the size of the wound.  Cover the wound with the non-stick pad. If the wound is draining, you may want to add a small amount of gauze on top of the non-stick pad for a little added compression to the area. Use tape to seal the area completely.  For the next 1-2 weeks: Be sure to keep the wound moist with ointment 24/7 to ensure best healing. If you are unable to cover the wound with a bandage to hold the ointment in place, you may need to reapply the ointment several times a day. Do not bend over or lift heavy items to reduce the chance of elevated blood pressure to the wound. Do not participate in particularly strenuous activities.  Below is a list of dressing supplies you might need.  Cotton-tipped applicators - Q-tips Gauze pads (2x2 and/or 4x4) - All-Purpose Sponges New and clean tube of petroleum jelly (Vaseline) OR prescription antibiotic ointment if prescribed Either a bandaid large enough to cover the entire wound OR non-stick dressing material (Telfa) and Tape (Paper or Hypafix)  FOR ADULT SURGERY PATIENTS: If you need something for pain relief, you may take 1 extra strength Tylenol (acetaminophen) and 2  ibuprofen (200 mg) together every 4 hours as needed. (Do not take these medications if you are allergic to them or if you know you cannot take them for any other reason). Typically you may only need pain medication for 1-3 days.   Comments on the Post-Operative Period Slight swelling and redness often appear around the wound. This is normal and will disappear within several days following the surgery. The healing wound will drain a brownish-red-yellow discharge during healing. This is a normal phase of wound healing. As the wound begins to heal, the drainage may increase in amount. Again, this drainage is normal. Notify us if the drainage becomes persistently bloody, excessively swollen, or intensely painful or develops a foul odor or red streaks.  The healing wound will also typically be itchy. This is normal. If you have severe or persistent pain, Notify us if the discomfort is severe or persistent. Avoid alcoholic beverages when taking pain medicine.  In Case of Wound Hemorrhage A wound hemorrhage is when the bandage suddenly becomes soaked with bright red blood and flows profusely. If this happens, sit down or lie down with your head elevated. If the wound has a dressing on it, do not remove the dressing. Apply pressure to the existing gauze. If the wound is not covered, use a gauze pad to apply pressure and continue applying the pressure for 20 minutes without peeking. DO NOT COVER THE WOUND WITH A LARGE TOWEL OR WASH CLOTH. Release your hand from the   wound site but do not remove the dressing. If the bleeding has stopped, gently clean around the wound. Leave the dressing in place for 24 hours if possible. This wait time allows the blood vessels to close off so that you do not spark a new round of bleeding by disrupting the newly clotted blood vessels with an immediate dressing change. If the bleeding does not subside, continue to hold pressure for 40 minutes. If bleeding continues, page your  physician, contact an After Hours clinic or go to the Emergency Room.   Due to recent changes in healthcare laws, you may see results of your pathology and/or laboratory studies on MyChart before the doctors have had a chance to review them. We understand that in some cases there may be results that are confusing or concerning to you. Please understand that not all results are received at the same time and often the doctors may need to interpret multiple results in order to provide you with the best plan of care or course of treatment. Therefore, we ask that you please give us 2 business days to thoroughly review all your results before contacting the office for clarification. Should we see a critical lab result, you will be contacted sooner.   If You Need Anything After Your Visit  If you have any questions or concerns for your doctor, please call our main line at [phone] and press option 4 to reach your doctor's medical assistant. If no one answers, please leave a voicemail as directed and we will return your call as soon as possible. Messages left after 4 pm will be answered the following business day.   You may also send us a message via MyChart. We typically respond to MyChart messages within 1-2 business days.  For prescription refills, please ask your pharmacy to contact our office. Our fax number is [phone].  If you have an urgent issue when the clinic is closed that cannot wait until the next business day, you can page your doctor at the number below.    Please note that while we do our best to be available for urgent issues outside of office hours, we are not available 24/7.   If you have an urgent issue and are unable to reach us, you may choose to seek medical care at your doctor's office, retail clinic, urgent care center, or emergency room.  If you have a medical emergency, please immediately call 911 or go to the emergency department.  Pager Numbers  - Dr. Kowalski:  [phone]  - Dr. Moye: [phone]  - Dr. Stewart: [phone]  In the event of inclement weather, please call our main line at [phone] for an update on the status of any delays or closures.  Dermatology Medication Tips: Please keep the boxes that topical medications come in in order to help keep track of the instructions about where and how to use these. Pharmacies typically print the medication instructions only on the boxes and not directly on the medication tubes.   If your medication is too expensive, please contact our office at [phone] option 4 or send us a message through MyChart.   We are unable to tell what your co-pay for medications will be in advance as this is different depending on your insurance coverage. However, we may be able to find a substitute medication at lower cost or fill out paperwork to get insurance to cover a needed medication.   If a prior authorization is required to get your medication covered   by your insurance company, please allow us 1-2 business days to complete this process.  Drug prices often vary depending on where the prescription is filled and some pharmacies may offer cheaper prices.  The website www.goodrx.com contains coupons for medications through different pharmacies. The prices here do not account for what the cost may be with help from insurance (it may be cheaper with your insurance), but the website can give you the price if you did not use any insurance.  - You can print the associated coupon and take it with your prescription to the pharmacy.  - You may also stop by our office during regular business hours and pick up a GoodRx coupon card.  - If you need your prescription sent electronically to a different pharmacy, notify our office through [facility] MyChart or by phone at [phone] option 4.     Si Usted Necesita Algo Despus de Su Visita  Tambin puede enviarnos un mensaje a travs de MyChart. Por lo general  respondemos a los mensajes de MyChart en el transcurso de 1 a 2 das hbiles.  Para renovar recetas, por favor pida a su farmacia que se ponga en contacto con nuestra oficina. Nuestro nmero de fax es el [phone].  Si tiene un asunto urgente cuando la clnica est cerrada y que no puede esperar hasta el siguiente da hbil, puede llamar/localizar a su doctor(a) al nmero que aparece a continuacin.   Por favor, tenga en cuenta que aunque hacemos todo lo posible para estar disponibles para asuntos urgentes fuera del horario de oficina, no estamos disponibles las 24 horas del da, los 7 das de la semana.   Si tiene un problema urgente y no puede comunicarse con nosotros, puede optar por buscar atencin mdica  en el consultorio de su doctor(a), en una clnica privada, en un centro de atencin urgente o en una sala de emergencias.  Si tiene una emergencia mdica, por favor llame inmediatamente al 911 o vaya a la sala de emergencias.  Nmeros de bper  - Dr. Kowalski: [phone]  - Dra. Moye: [phone]  - Dra. Stewart: [phone]  En caso de inclemencias del tiempo, por favor llame a nuestra lnea principal al [phone] para una actualizacin sobre el estado de cualquier retraso o cierre.  Consejos para la medicacin en dermatologa: Por favor, guarde las cajas en las que vienen los medicamentos de uso tpico para ayudarle a seguir las instrucciones sobre dnde y cmo usarlos. Las farmacias generalmente imprimen las instrucciones del medicamento slo en las cajas y no directamente en los tubos del medicamento.   Si su medicamento es muy caro, por favor, pngase en contacto con nuestra oficina llamando al [phone] y presione la opcin 4 o envenos un mensaje a travs de MyChart.   No podemos decirle cul ser su copago por los medicamentos por adelantado ya que esto es diferente dependiendo de la cobertura de su seguro. Sin embargo, es posible que podamos encontrar un  medicamento sustituto a menor costo o llenar un formulario para que el seguro cubra el medicamento que se considera necesario.   Si se requiere una autorizacin previa para que su compaa de seguros cubra su medicamento, por favor permtanos de 1 a 2 das hbiles para completar este proceso.  Los precios de los medicamentos varan con frecuencia dependiendo del lugar de dnde se surte la receta y alguna farmacias pueden ofrecer precios ms baratos.  El sitio web www.goodrx.com tiene cupones para medicamentos de diferentes farmacias. Los precios aqu no   tienen en cuenta lo que podra costar con la ayuda del seguro (puede ser ms barato con su seguro), pero el sitio web puede darle el precio si no utiliz ningn seguro.  - Puede imprimir el cupn correspondiente y llevarlo con su receta a la farmacia.  - Tambin puede pasar por nuestra oficina durante el horario de atencin regular y recoger una tarjeta de cupones de GoodRx.  - Si necesita que su receta se enve electrnicamente a una farmacia diferente, informe a nuestra oficina a travs de MyChart de [place] o por telfono llamando al [phone] y presione la opcin 4.   

## 2022-11-18 ENCOUNTER — Ambulatory Visit (INDEPENDENT_AMBULATORY_CARE_PROVIDER_SITE_OTHER): Payer: Medicaid Other | Admitting: Dermatology

## 2022-11-18 ENCOUNTER — Encounter: Payer: Self-pay | Admitting: Dermatology

## 2022-11-18 VITALS — BP 97/70 | HR 73

## 2022-11-18 DIAGNOSIS — Z4802 Encounter for removal of sutures: Secondary | ICD-10-CM

## 2022-11-18 NOTE — Patient Instructions (Addendum)
Apply diclofenac (voltaren) gel twice a day to spots.    Due to recent changes in healthcare laws, you may see results of your pathology and/or laboratory studies on MyChart before the doctors have had a chance to review them. We understand that in some cases there may be results that are confusing or concerning to you. Please understand that not all results are received at the same time and often the doctors may need to interpret multiple results in order to provide you with the best plan of care or course of treatment. Therefore, we ask that you please give [place] 2 business days to thoroughly review all your results before contacting the office for clarification. Should we see a critical lab result, you will be contacted sooner.   If You Need Anything After Your Visit  If you have any questions or concerns for your doctor, please call our main line at [phone] and press option 4 to reach your doctor's medical assistant. If no one answers, please leave a voicemail as directed and we will return your call as soon as possible. Messages left after 4 pm will be answered the following business day.   You may also send [place] a message via MyChart. We typically respond to MyChart messages within 1-2 business days.  For prescription refills, please ask your pharmacy to contact our office. Our fax number is [phone].  If you have an urgent issue when the clinic is closed that cannot wait until the next business day, you can page your doctor at the number below.    Please note that while we do our best to be available for urgent issues outside of office hours, we are not available 24/7.   If you have an urgent issue and are unable to reach [place], you may choose to seek medical care at your doctor's office, retail clinic, urgent care center, or emergency room.  If you have a medical emergency, please immediately call 911 or go to the emergency department.  Pager Numbers  - Dr. Gwen Pounds:  [phone]  - Dr. Neale Burly: [phone]  - Dr. Roseanne Reno: [phone]  In the event of inclement weather, please call our main line at [phone] for an update on the status of any delays or closures.  Dermatology Medication Tips: Please keep the boxes that topical medications come in in order to help keep track of the instructions about where and how to use these. Pharmacies typically print the medication instructions only on the boxes and not directly on the medication tubes.   If your medication is too expensive, please contact our office at [phone] option 4 or send [place] a message through MyChart.   We are unable to tell what your co-pay for medications will be in advance as this is different depending on your insurance coverage. However, we may be able to find a substitute medication at lower cost or fill out paperwork to get insurance to cover a needed medication.   If a prior authorization is required to get your medication covered by your insurance company, please allow [place] 1-2 business days to complete this process.  Drug prices often vary depending on where the prescription is filled and some pharmacies may offer cheaper prices.  The website www.goodrx.com contains coupons for medications through different pharmacies. The prices here do not account for what the cost may be with help from insurance (it may be cheaper with your insurance), but the website can give you the price if you did not use any insurance.  -  You can print the associated coupon and take it with your prescription to the pharmacy.  - You may also stop by our office during regular business hours and pick up a GoodRx coupon card.  - If you need your prescription sent electronically to a different pharmacy, notify our office through [facility] or by phone at [phone] option 4.     Si Usted Necesita Algo Despus de Su Visita  Tambin puede enviarnos un mensaje a travs de Pharmacist, community. Por lo general  respondemos a los mensajes de MyChart en el transcurso de 1 a 2 das hbiles.  Para renovar recetas, por favor pida a su farmacia que se ponga en contacto con nuestra oficina. Harland Dingwall de fax es [place] [phone].  Si tiene un asunto urgente cuando la clnica est cerrada y que no puede esperar hasta el siguiente da hbil, puede llamar/localizar a su doctor(a) al nmero que aparece a continuacin.   Por favor, tenga en cuenta que aunque hacemos todo lo posible para estar disponibles para asuntos urgentes fuera del horario de [place], no estamos disponibles las 24 horas del da, los 7 das de la [place].   Si tiene un problema urgente y no puede comunicarse con nosotros, puede optar por buscar atencin mdica  en el consultorio de su doctor(a), en una clnica privada, en un centro de atencin urgente o en una sala de emergencias.  Si tiene Engineering geologist, por favor llame inmediatamente al 911 o vaya a la sala de emergencias.  Nmeros de bper  - Dr. Nehemiah Massed: [phone]  - Dra. Moye: [phone]  - Dra. Nicole Kindred: [phone]  En caso de inclemencias del [place], por favor llame a Johnsie Kindred principal al [phone] para una actualizacin sobre el [place] de cualquier retraso o cierre.  Consejos para la medicacin en dermatologa: Por favor, guarde las cajas en las que vienen los medicamentos de uso tpico para ayudarle a seguir las instrucciones sobre dnde y cmo usarlos. Las farmacias generalmente imprimen las instrucciones del medicamento slo en las cajas y no directamente en los tubos del [place].   Si su medicamento es muy caro, por favor, pngase en contacto con Zigmund Daniel llamando al [phone] y presione la opcin 4 o envenos un mensaje a travs de Pharmacist, community.   No podemos decirle cul ser su copago por los medicamentos por adelantado ya que esto es diferente dependiendo de la cobertura de su seguro. Sin embargo, es posible que podamos encontrar un  medicamento sustituto a Electrical engineer un formulario para que el seguro cubra el medicamento que se considera necesario.   Si se requiere una autorizacin previa para que su compaa de seguros [place] su medicamento, por favor permtanos de 1 a 2 das hbiles para completar este proceso.  Los precios de los medicamentos varan con frecuencia dependiendo del Environmental consultant de dnde se surte la receta y alguna farmacias pueden ofrecer precios ms baratos.  El sitio web www.goodrx.com tiene cupones para medicamentos de Airline pilot. Los precios aqu no tienen en cuenta lo que podra costar con la ayuda del seguro (puede ser ms barato con su seguro), pero el sitio web puede darle el precio si no utiliz Research scientist (physical sciences).  - Puede imprimir el cupn correspondiente y llevarlo con su receta a la farmacia.  - Tambin puede pasar por nuestra oficina durante el horario de atencin regular y Charity fundraiser una tarjeta de cupones de GoodRx.  - Si necesita que su receta se enve electrnicamente a una farmacia diferente, informe  a nuestra oficina a travs de MyChart de [place] o por telfono llamando al [phone] y presione la opcin 4.

## 2022-11-18 NOTE — Progress Notes (Signed)
   Follow-Up Visit   Subjective  Sonia Smith is a 42 y.o. female who presents for the following: Suture removal. Left lateral eyebrow  Pathology showed Epidermoid cyst  The following portions of the chart were reviewed this encounter and updated as appropriate: medications, allergies, medical history  Review of Systems:  No other skin or systemic complaints except as noted in HPI or Assessment and Plan.  Objective  Well appearing patient in no apparent distress; mood and affect are within normal limits.  Areas Examined: face Relevant physical exam findings are noted in the Assessment and Plan.   Assessment & Plan    Encounter for Removal of Sutures - Incision site is clean, dry and intact. - Wound cleansed, sutures removed, wound cleansed and steri strips applied.  - Discussed pathology results showing Epidermoid cyst - Patient advised to keep steri-strips dry until they fall off. - Scars remodel for a full year. - Once steri-strips fall off, patient can apply over-the-counter silicone scar cream once to twice a day to help with scar remodeling if desired. - Patient advised to call with any concerns or if they notice any new or changing lesions.  Return for Melasma Follow Up as scheduled.  I, Lawson Radar, CMA, am acting as scribe for Darden Dates, MD.  Darden Dates

## 2022-11-20 ENCOUNTER — Encounter: Payer: Self-pay | Admitting: Dermatology

## 2022-11-25 ENCOUNTER — Other Ambulatory Visit: Payer: Self-pay | Admitting: Family

## 2022-11-25 DIAGNOSIS — Z1231 Encounter for screening mammogram for malignant neoplasm of breast: Secondary | ICD-10-CM

## 2022-11-26 ENCOUNTER — Ambulatory Visit
Admission: RE | Admit: 2022-11-26 | Discharge: 2022-11-26 | Disposition: A | Discharge status: Home / Self Care | Payer: Medicaid Other | Source: Ambulatory Visit | Attending: Family | Admitting: Family

## 2022-11-26 ENCOUNTER — Other Ambulatory Visit: Payer: Self-pay | Admitting: Family

## 2022-11-26 ENCOUNTER — Encounter: Payer: Self-pay | Admitting: Family

## 2022-11-26 DIAGNOSIS — Z1231 Encounter for screening mammogram for malignant neoplasm of breast: Secondary | ICD-10-CM | POA: Insufficient documentation

## 2022-11-26 DIAGNOSIS — N6321 Unspecified lump in the left breast, upper outer quadrant: Secondary | ICD-10-CM

## 2022-12-04 ENCOUNTER — Ambulatory Visit
Admission: RE | Admit: 2022-12-04 | Discharge: 2022-12-04 | Disposition: A | Discharge status: Home / Self Care | Payer: Medicaid Other | Source: Ambulatory Visit | Attending: Family | Admitting: Family

## 2022-12-04 ENCOUNTER — Other Ambulatory Visit: Payer: Self-pay | Admitting: Family

## 2022-12-04 DIAGNOSIS — R92333 Mammographic heterogeneous density, bilateral breasts: Secondary | ICD-10-CM | POA: Diagnosis not present

## 2022-12-04 DIAGNOSIS — Z1231 Encounter for screening mammogram for malignant neoplasm of breast: Secondary | ICD-10-CM

## 2022-12-04 DIAGNOSIS — N6321 Unspecified lump in the left breast, upper outer quadrant: Secondary | ICD-10-CM | POA: Insufficient documentation

## 2022-12-05 ENCOUNTER — Other Ambulatory Visit: Payer: Self-pay | Admitting: Family

## 2022-12-05 DIAGNOSIS — N63 Unspecified lump in unspecified breast: Secondary | ICD-10-CM

## 2022-12-05 DIAGNOSIS — R928 Other abnormal and inconclusive findings on diagnostic imaging of breast: Secondary | ICD-10-CM

## 2022-12-09 ENCOUNTER — Ambulatory Visit
Admission: RE | Admit: 2022-12-09 | Discharge: 2022-12-09 | Disposition: A | Discharge status: Home / Self Care | Payer: Medicaid Other | Source: Ambulatory Visit | Attending: Family | Admitting: Family

## 2022-12-09 DIAGNOSIS — R928 Other abnormal and inconclusive findings on diagnostic imaging of breast: Secondary | ICD-10-CM

## 2022-12-09 DIAGNOSIS — N63 Unspecified lump in unspecified breast: Secondary | ICD-10-CM

## 2022-12-09 DIAGNOSIS — N6321 Unspecified lump in the left breast, upper outer quadrant: Secondary | ICD-10-CM | POA: Diagnosis not present

## 2022-12-09 HISTORY — PX: BREAST BIOPSY: SHX20

## 2022-12-09 MED ORDER — LIDOCAINE-EPINEPHRINE (PF) 1 %-1:200000 IJ SOLN
10.0000 mL | Freq: Once | INTRAMUSCULAR | Status: AC
  Administered 2022-12-09: 10 mL via INTRADERMAL
  Filled 2022-12-09: qty 10

## 2022-12-09 NOTE — Progress Notes (Signed)
noted 

## 2022-12-10 LAB — SURGICAL PATHOLOGY

## 2022-12-11 NOTE — Progress Notes (Signed)
noted 

## 2022-12-17 ENCOUNTER — Ambulatory Visit: Payer: Medicaid Other | Admitting: Dermatology

## 2022-12-17 VITALS — BP 107/72 | HR 69

## 2022-12-17 DIAGNOSIS — L821 Other seborrheic keratosis: Secondary | ICD-10-CM | POA: Diagnosis not present

## 2022-12-17 DIAGNOSIS — L811 Chloasma: Secondary | ICD-10-CM

## 2022-12-17 DIAGNOSIS — L700 Acne vulgaris: Secondary | ICD-10-CM | POA: Diagnosis not present

## 2022-12-17 DIAGNOSIS — I781 Nevus, non-neoplastic: Secondary | ICD-10-CM | POA: Diagnosis not present

## 2022-12-17 MED ORDER — CLINDAMYCIN PHOSPHATE 1 % EX SOLN: Freq: Every day | CUTANEOUS | 3 refills | Status: DC

## 2022-12-17 NOTE — Patient Instructions (Addendum)
Recommend taking Heliocare sun protection supplement daily in sunny weather for additional sun protection. For maximum protection on the sunniest days, you can take up to 2 capsules of regular Heliocare OR take 1 capsule of Heliocare Ultra. For prolonged exposure (such as a full day in the sun), you can repeat your dose of the supplement 4 hours after your first dose. Heliocare can be purchased at [employer], at some [facility] or at [email or web address].   Some Recommended Sunscreens Include:  Good for Daily Wear (feels like lotion but NOT sweat resistant) Cerave AM Moisturizer with SPF EltaMD UV Lotion  Body or All Over Sunscreen EltaMD UV active for body and face Blue lizard sensitive Sun bum mineral (avoid if sensitive to scent) Aveeno Positively Mineral Neutrogena sheer zinc (Slightly harder to rub in) [facility] clear zinc (Slightly harder to rub in)  Clear Face Sunscreen EltaMD UV Elements CeraVe hydrating sunscreen 50 face  Tinted Face Sunscreen Alastin Hydratint (good for most skin tones, may be slightly dark if you are very fair) Col[employer]cience Sunforgettable Total Protection Face Shield (good for most skin tones) EltaMD UV Physical La Roche Posay Mineral Tinted Cotz Flawless Complexion   Powder Sunscreen (Nice for reapplying or applying on the go) Colorescience Sunforgettable Total Protection Brush on Shield (available in different tints)  Face Sunscreen Available in Different Tints Colorescience Sunforgettable Total Protection Brush on Shield  bareMinerals Complexion Rescue Tinted Hydrating Gel Cream Broad Spectrum SPF 30 UnSun mineral tinted (comes in medium/dark and light/medium)  Kids (over 6 months) - Mineral Sunscreens Recommended eltaMD UV Pure MDsolarSciences KidStick 40 SPF Aveeno Baby Continuous Protection Sensitive Zinc Oxide Blue Lizard Kids mineral based sunscreen lotion Mustela Mineral Sunscreen for face and body Neutrogena Sheer Zinc Kids Sunscreen  Stick  Tinted to look like a tan PCAskin sheer tint body spray    Recommend [facility] Hypochlorous Spray (found in the wound care section) OR Cln brand Acne or Sports wash. The [facility] Hypochlorous Spray can be sprayed on daily and left on. The Cln wash should be applied to the affected area daily for at least 30 seconds and then rinsed off. If you are using clindamycin solution or lotion or another topical antibiotic to treat acne, using a hypochlorous product may help lower the risk of antibiotic resistant bacteria.   Due to recent changes in healthcare laws, you may see results of your pathology and/or laboratory studies on MyChart before the doctors have had a chance to review them. We understand that in some cases there may be results that are confusing or concerning to you. Please understand that not all results are received at the same time and often the doctors may need to interpret multiple results in order to provide you with the best plan of care or course of treatment. Therefore, we ask that you please give [place] 2 business days to thoroughly review all your results before contacting the office for clarification. Should we see a critical lab result, you will be contacted sooner.   If You Need Anything After Your Visit  If you have any questions or concerns for your doctor, please call our main line at [phone] and press option 4 to reach your doctor's medical assistant. If no one answers, please leave a voicemail as directed and we will return your call as soon as possible. Messages left after 4 pm will be answered the following business day.   You may also send [place] a message via MyChart. We typically respond to MyChart messages within  1-2 business days.  For prescription refills, please ask your pharmacy to contact our office. Our fax number is [phone].  If you have an urgent issue when the clinic is closed that cannot wait until the next business day, you can page your doctor  at the number below.    Please note that while we do our best to be available for urgent issues outside of office hours, we are not available 24/7.   If you have an urgent issue and are unable to reach [place], you may choose to seek medical care at your doctor's office, retail clinic, urgent care center, or emergency room.  If you have a medical emergency, please immediately call 911 or go to the emergency department.  Pager Numbers  - Dr. Gwen Pounds: [phone]  - Dr. Neale Burly: [phone]  - Dr. Roseanne Reno: [phone]  In the event of inclement weather, please call our main line at [phone] for an update on the status of any delays or closures.  Dermatology Medication Tips: Please keep the boxes that topical medications come in in order to help keep track of the instructions about where and how to use these. Pharmacies typically print the medication instructions only on the boxes and not directly on the medication tubes.   If your medication is too expensive, please contact our office at [phone] option 4 or send [place] a message through MyChart.   We are unable to tell what your co-pay for medications will be in advance as this is different depending on your insurance coverage. However, we may be able to find a substitute medication at lower cost or fill out paperwork to get insurance to cover a needed medication.   If a prior authorization is required to get your medication covered by your insurance company, please allow [place] 1-2 business days to complete this process.  Drug prices often vary depending on where the prescription is filled and some pharmacies may offer cheaper prices.  The website www.goodrx.com contains coupons for medications through different pharmacies. The prices here do not account for what the cost may be with help from insurance (it may be cheaper with your insurance), but the website can give you the price if you did not use any insurance.  - You can print the  associated coupon and take it with your prescription to the pharmacy.  - You may also stop by our office during regular business hours and pick up a GoodRx coupon card.  - If you need your prescription sent electronically to a different pharmacy, notify our office through [facility] or by phone at [phone] option 4.     Si Usted Necesita Algo Despus de Su Visita  Tambin puede enviarnos un mensaje a travs de Clinical cytogeneticist. Por lo general respondemos a los mensajes de MyChart en el transcurso de 1 a 2 das hbiles.  Para renovar recetas, por favor pida a su farmacia que se ponga en contacto con nuestra oficina. Annie Sable de fax es [place] [phone].  Si tiene un asunto urgente cuando la clnica est cerrada y que no puede esperar hasta el siguiente da hbil, puede llamar/localizar a su doctor(a) al nmero que aparece a continuacin.   Por favor, tenga en cuenta que aunque hacemos todo lo posible para estar disponibles para asuntos urgentes fuera del horario de [place], no estamos disponibles las 24 horas del da, los 7 [address]  [address] de la [place].   Si tiene un problema urgente y no puede comunicarse con nosotros, puede optar por buscar  atencin mdica  en el consultorio de su doctor(a), en una clnica privada, en un centro de atencin urgente o en una sala de emergencias.  Si tiene Engineer, drilling, por favor llame inmediatamente al 911 o vaya a la sala de emergencias.  Nmeros de bper  - Dr. Gwen Pounds: [phone]  - Dra. Moye: [phone]  - Dra. Roseanne Reno: [phone]  En caso de inclemencias del [employer], por favor llame a Lacy Duverney principal al [phone] para una actualizacin sobre el [place] de cualquier retraso o cierre.  Consejos para la medicacin en dermatologa: Por favor, guarde las cajas en las que vienen los medicamentos de uso tpico para ayudarle a seguir las instrucciones sobre dnde y cmo usarlos. Las farmacias generalmente imprimen las instrucciones  del medicamento slo en las cajas y no directamente en los tubos del [place].   Si su medicamento es muy caro, por favor, pngase en contacto con Rolm Gala llamando al [phone] y presione la opcin 4 o envenos un mensaje a travs de Clinical cytogeneticist.   No podemos decirle cul ser su copago por los medicamentos por adelantado ya que esto es diferente dependiendo de la cobertura de su seguro. Sin embargo, es posible que podamos encontrar un medicamento sustituto a Audiological scientist un formulario para que el seguro cubra el medicamento que se considera necesario.   Si se requiere una autorizacin previa para que su compaa de seguros [place] su medicamento, por favor permtanos de 1 a 2 das hbiles para completar [address]  Los precios de los medicamentos varan con frecuencia dependiendo del Environmental consultant de dnde se surte la receta y alguna farmacias pueden ofrecer precios ms baratos.  El sitio web www.goodrx.com tiene cupones para medicamentos de Health and safety inspector. Los precios aqu no tienen en cuenta lo que podra costar con la ayuda del seguro (puede ser ms barato con su seguro), pero el sitio web puede darle el precio si no utiliz Tourist information centre manager.  - Puede imprimir el cupn correspondiente y llevarlo con su receta a la farmacia.  - Tambin puede pasar por nuestra oficina durante el horario de atencin regular y Education officer, museum una tarjeta de cupones de GoodRx.  - Si necesita que su receta se enve electrnicamente a una farmacia diferente, informe a nuestra oficina a travs de MyChart de [place] o por telfono llamando al [phone] y presione la opcin 4.

## 2022-12-17 NOTE — Progress Notes (Signed)
Follow-Up Visit   Subjective  Sonia Smith is a 42 y.o. female who presents for the following: Melasma - patient has noticed an improvement since starting Skin Medicinals hydroquinone mix. She has been using it for about 3-4 weeks and has noticed that the L side of her face is breaking out in acne papules.  The following portions of the chart were reviewed this encounter and updated as appropriate: medications, allergies, medical history  Review of Systems:  No other skin or systemic complaints except as noted in HPI or Assessment and Plan.  Objective  Well appearing patient in no apparent distress; mood and affect are within normal limits.  Areas Examined: face  Relevant physical exam findings are noted in the Assessment and Plan.   Assessment & Plan    MELASMA Exam: reticulated hyperpigmented patches at face  Chronic and persistent condition with duration or expected duration over one year. Condition is symptomatic/ bothersome to patient. Improving but not currently at goal.  Melasma is a chronic; persistent condition of hyperpigmented patches generally on the face, worse in summer due to higher UV exposure.    Heredity; thyroid disease; sun exposure; pregnancy; birth control pills; epilepsy medication and darker skin may predispose to Melasma.   Recommendations include: - Sun avoidance and daily broad spectrum (UVA/UVB) tinted mineral sunscreen with zinc SPF 30+, with Zinc or Titanium Dioxide. - Rx topical bleaching creams (i.e. hydroquinone) is a common treatment but should not be used long term.  Hydroquinones may be mixed with retinoids; vitamin C; steroids; Kojic Acid. - Alastin A-luminate, retinoids, vitamin C, topical tranexamic acid, glycolic acid and kojic acid can be used for brightening while on break from hydroquinone - Rx Azelaic Acid is also a treatment option that is safe for pregnancy (Category B). - OTC Heliocare can be helpful in control and  prevention. - Oral Rx with Tranexamic Acid 250 mg - 650 mg po daily can be used for moderate to severe cases especially during summer (contraindications include pregnancy; lactation; hx of PE; hx of DVT; clotting disorder; heart disease; anticoagulant use and upcoming long trips)   - Chemical peels (would need multiple for best result).  - Lasers and  Microdermabrasion may also be helpful adjunct treatments.  Treatment Plan: Continue Skin Medicinals Hydroquinone 12%/kojic acid/vitamin C cream pea sized amount increasing to twice daily to the entire face for up to 3 months. This cannot be used more than 3 months due to risk of exogenous ochronosis (permanent dark spots).   Continue tinted mineral sunscreen with zinc  Recommend adding heliocare sun protection supplement.  ACNE VULGARIS Exam: Open comedones and inflammatory papules  Treatment Plan: Start Clindamycin solution (gel not covered) QD pairing with hypochlorous acid spray QD.   Recommend [facility] Hypochlorous Spray (found in the wound care section) OR Cln brand Acne or Sports wash. The [facility] Hypochlorous Spray can be sprayed on daily and left on. The Cln wash should be applied to the affected area daily for at least 30 seconds and then rinsed off. If you are using clindamycin solution or lotion or another topical antibiotic to treat acne, using a hypochlorous product may help lower the risk of antibiotic resistant bacteria.   Telangiectasia - R cheek  - Dilated blood vessel - Benign appearing on exam - Call for changes - Counseling for BBL / IPL / Laser and Coordination of Care Discussed the treatment option of Broad Band Light (BBL) /Intense Pulsed Light (IPL)/ Laser for skin discoloration, including brown spots  and redness.  Typically we recommend at least 1-3 treatment sessions about 5-8 weeks apart for best results.  Cannot have tanned skin when BBL performed, and regular use of sunscreen is advised after the procedure to  help maintain results. The patient's condition may also require "maintenance treatments" in the future.  The fee for BBL / laser treatments is $350 per treatment session for the whole face.  A fee can be quoted for other parts of the body.  Insurance typically does not pay for BBL/laser treatments and therefore the fee is an out-of-pocket cost.  SEBORRHEIC KERATOSIS - L cheek - Stuck-on, waxy, tan-brown papules and/or plaques  - Benign-appearing - Discussed benign etiology and prognosis. - Observe - Call for any changes - Continue diclofenac gel BID and Skin Medicinals hydroquinone mix BID x 2 more months. Can consider LN2 at f/u if it does not clear. Discussed this would be cosmetic.   Return in about 1 year (around 12/17/2023) for melasma follow up .  Maylene Roes, CMA, am acting as scribe for Darden Dates, MD .  Documentation: I have reviewed the above documentation for accuracy and completeness, and I agree with the above.  Darden Dates, MD

## 2022-12-18 ENCOUNTER — Encounter: Payer: Self-pay | Admitting: Dermatology

## 2022-12-18 NOTE — Telephone Encounter (Signed)
Fine to send tretinoin. Please check which version she wants. Please also send an additional 3 refills of the hydroquinone. Thank you!  There are a few different versions of tretinoin. The least expensive is generic tretinoin 0.025% cream from your regular pharmacy. The strongest is the Perfect A which we carry at the front desk and which includes vitamin C to help fade dark spots.  The other version I like is made by a compounding pharmacy and includes vitamin C, hyaluronic acid, resveratrol and an antiinflammatory vitamin to work as an "all in one" anti-aging cream. That is around $60 per bottle which lasts usually a couple of months.  Topical retinoid medications like tretinoin/Retin-A, adapalene/Differin, tazarotene/Fabior, and Epiduo/Epiduo Forte can cause dryness and irritation when first started. Only apply a pea-sized amount to the entire affected area. Avoid applying it around the eyes, edges of mouth and creases at the nose. If you experience irritation, use a good moisturizer first and/or apply the medicine less often. If you are doing well with the medicine, you can increase how often you use it until you are applying every night. Be careful with sun protection while using this medication as it can make you sensitive to the sun. This medicine should not be used by pregnant women.

## 2022-12-23 ENCOUNTER — Other Ambulatory Visit: Payer: Self-pay

## 2022-12-23 MED ORDER — TRETINOIN 0.025 % EX CREA: TOPICAL_CREAM | Freq: Every day | CUTANEOUS | 1 refills | Status: DC

## 2022-12-25 ENCOUNTER — Encounter: Payer: Self-pay | Admitting: Family

## 2022-12-25 ENCOUNTER — Ambulatory Visit (INDEPENDENT_AMBULATORY_CARE_PROVIDER_SITE_OTHER): Payer: Medicaid Other | Admitting: Family

## 2022-12-25 VITALS — BP 114/66 | HR 74 | Temp 97.8°F | Ht 67.0 in | Wt 119.4 lb

## 2022-12-25 DIAGNOSIS — Z0001 Encounter for general adult medical examination with abnormal findings: Secondary | ICD-10-CM

## 2022-12-25 DIAGNOSIS — R41840 Attention and concentration deficit: Secondary | ICD-10-CM

## 2022-12-25 DIAGNOSIS — E559 Vitamin D deficiency, unspecified: Secondary | ICD-10-CM | POA: Diagnosis not present

## 2022-12-25 DIAGNOSIS — E538 Deficiency of other specified B group vitamins: Secondary | ICD-10-CM

## 2022-12-25 DIAGNOSIS — W57XXXA Bitten or stung by nonvenomous insect and other nonvenomous arthropods, initial encounter: Secondary | ICD-10-CM

## 2022-12-25 DIAGNOSIS — E782 Mixed hyperlipidemia: Secondary | ICD-10-CM

## 2022-12-25 DIAGNOSIS — R5383 Other fatigue: Secondary | ICD-10-CM | POA: Diagnosis not present

## 2022-12-25 LAB — BASIC METABOLIC PANEL
BUN: 6 mg/dL (ref 6–23)
CO2: 30 mEq/L (ref 19–32)
Calcium: 9.8 mg/dL (ref 8.4–10.5)
Chloride: 102 mEq/L (ref 96–112)
Creatinine, Ser: 0.88 mg/dL (ref 0.40–1.20)
GFR: 81.38 mL/min (ref >60.00)
Glucose, Bld: 91 mg/dL (ref 70–99)
Potassium: 4.9 mEq/L (ref 3.5–5.1)
Sodium: 140 mEq/L (ref 135–145)

## 2022-12-25 LAB — CBC
HCT: 46.9 % — ABNORMAL HIGH (ref 36.0–46.0)
Hemoglobin: 15.7 g/dL — ABNORMAL HIGH (ref 12.0–15.0)
MCHC: 33.5 g/dL (ref 30.0–36.0)
MCV: 97.9 fl (ref 78.0–100.0)
Platelets: 243 10*3/uL (ref 150.0–400.0)
RBC: 4.79 Mil/uL (ref 3.87–5.11)
RDW: 12.5 % (ref 11.5–15.5)
WBC: 6.9 10*3/uL (ref 4.0–10.5)

## 2022-12-25 LAB — LIPID PANEL
Cholesterol: 171 mg/dL (ref 0–200)
HDL: 37.9 mg/dL — ABNORMAL LOW (ref >39.00)
LDL Cholesterol: 115 mg/dL — ABNORMAL HIGH (ref 0–99)
NonHDL: 132.65
Total CHOL/HDL Ratio: 5
Triglycerides: 86 mg/dL (ref 0.0–149.0)
VLDL: 17.2 mg/dL (ref 0.0–40.0)

## 2022-12-25 LAB — B12 AND FOLATE PANEL
Folate: 6.6 ng/mL (ref >5.9)
Vitamin B-12: 357 pg/mL (ref 211–911)

## 2022-12-25 LAB — VITAMIN D 25 HYDROXY (VIT D DEFICIENCY, FRACTURES): VITD: 88.97 ng/mL (ref 30.00–100.00)

## 2022-12-25 LAB — TSH: TSH: 1.05 u[IU]/mL (ref 0.35–5.50)

## 2022-12-25 MED ORDER — DOXYCYCLINE HYCLATE 100 MG PO TABS
ORAL_TABLET | ORAL | 0 refills | Status: DC
Start: 2022-12-25 — End: 2023-01-28

## 2022-12-25 NOTE — Progress Notes (Signed)
Subjective:  Patient ID: Sonia Smith, female    DOB: 1981/01/20  Age: 42 y.o. MRN: [id number]  Patient Care Team: Mort Sawyers, FNP as PCP - General (Family Medicine) Van Clines, MD as Consulting Physician (Neurology)   CC:  Chief Complaint  Patient presents with   Annual Exam    HPI Sonia Smith is a 42 y.o. female who presents today for an annual physical exam. She reports consuming a general diet.  Not much  She generally feels well. She reports sleeping well. She does have additional problems to discuss today.   Vision:Within last year Dental:Receives regular dental care STD:The patient denies history of sexually transmitted disease.   Mammogram: 12/07/22 biopsy negative  Last pap: 05/16/2016, complete hysterectomy without cervix. Negative paps 2015, 2016 and 2017. Does have family history   Pt is with acute concerns.   Bit by a tick on left upper arm that she feels is causing her to have hot flashes and giving her chills. She found it on her small, and pretty sure she removed within 72 hours.   Recent breast biopsy benign findings.   She has an upcoming cystoscopy for ongoing IC and urethral /bladder spasms.  She has bene referred to urogyn but appt not until August 2024. She was seen by gyn 11/24, evaluated for vaginal dryness. They did recommend vaginal estrogen which it did help with vaginal dryness. She has noticed less bladder type spasms.   Lack of focus and concentration, in school again and finds herself drifting off. Was on medication when she was really young however not since.   Advanced Directives Patient does not have advanced directives.   DEPRESSION SCREENING    12/25/2022   11:56 AM 06/10/2021   10:41 AM 05/21/2020    3:31 PM 05/11/2018    9:39 AM 02/22/2018    4:06 PM 08/28/2017    1:51 PM 01/03/2016    2:51 PM  PHQ 2/9 Scores  PHQ - 2 Score 3 0 0 0 0 0 0  PHQ- 9 Score 12 6 0         ROS: Negative unless specifically  indicated above in HPI.    Current Outpatient Medications:    acetaminophen-codeine (TYLENOL #4) 300-60 MG tablet, Take 1 tablet by mouth every 4 (four) hours as needed., Disp: , Rfl:    acyclovir (ZOVIRAX) 800 MG tablet, TAKE 1 TABLET BY MOUTH EVERY MORNING, Disp: 90 tablet, Rfl: 2   cholecalciferol (VITAMIN D3) 25 MCG (1000 UNIT) tablet, Take 1,000 Units by mouth once a week., Disp: , Rfl:    clindamycin (CLEOCIN T) 1 % external solution, Apply topically daily., Disp: 30 mL, Rfl: 3   docusate sodium (COLACE) 100 MG capsule, Take 100 mg by mouth 2 (two) times daily., Disp: , Rfl:    doxycycline (VIBRA-TABS) 100 MG tablet, Take two tablets po qd for one dose, Disp: 2 tablet, Rfl: 0   gabapentin (NEURONTIN) 800 MG tablet, Take 1,200 mg by mouth 2 (two) times daily., Disp: , Rfl:    tretinoin (RETIN-A) 0.025 % cream, Apply topically at bedtime., Disp: 45 g, Rfl: 1    Objective:    BP 114/66   Pulse 74   Temp 97.8 F (36.6 C) (Temporal)   Ht 5\' 7"  (1.702 m)   Wt 119 lb 6.4 oz (54.2 kg)   SpO2 98%   BMI 18.70 kg/m   BP Readings from Last 3 Encounters:  12/25/22 114/66  12/17/22 107/72  11/18/22  97/70      Physical Exam Vitals reviewed.  Constitutional:      General: She is not in acute distress.    Appearance: Normal appearance. She is normal weight. She is not ill-appearing.  HENT:     Head: Normocephalic.     Right Ear: Tympanic membrane normal.     Left Ear: Tympanic membrane normal.     Nose: Nose normal.     Mouth/Throat:     Mouth: Mucous membranes are moist.  Eyes:     Extraocular Movements: Extraocular movements intact.     Pupils: Pupils are equal, round, and reactive to light.  Cardiovascular:     Rate and Rhythm: Normal rate and regular rhythm.  Pulmonary:     Effort: Pulmonary effort is normal.     Breath sounds: Normal breath sounds.  Abdominal:     General: Abdomen is flat. Bowel sounds are normal.     Palpations: Abdomen is soft.     Tenderness:  There is no guarding or rebound.  Musculoskeletal:        General: Normal range of motion.     Cervical back: Normal range of motion.  Skin:    General: Skin is warm.     Capillary Refill: Capillary refill takes less than 2 seconds.  Neurological:     General: No focal deficit present.     Mental Status: She is alert.  Psychiatric:        Mood and Affect: Mood normal.        Behavior: Behavior normal.        Thought Content: Thought content normal.        Judgment: Judgment normal.          Assessment & Plan:  Tick bite, unspecified site, initial encounter Assessment & Plan: Doxycycline 200 mg x one dose   Orders: -     Doxycycline Hyclate; Take two tablets po qd for one dose  Dispense: 2 tablet; Refill: 0  Low serum vitamin B12 Assessment & Plan: Rechecking b12 pending results  Orders: -     B12 and Folate Panel  Vitamin D deficiency Assessment & Plan: Ordered vitamin d pending results.    Orders: -     VITAMIN D 25 Hydroxy (Vit-D Deficiency, Fractures)  Encounter for general adult medical examination with abnormal findings Assessment & Plan: Patient Counseling(The following topics were reviewed):  Preventative care handout given to pt  Health maintenance and immunizations reviewed. Please refer to Health maintenance section. Pt advised on safe sex, wearing seatbelts in car, and proper nutrition labwork ordered today for annual Dental health: Discussed importance of regular tooth brushing, flossing, and dental visits.'  Orders: -     VITAMIN D 25 Hydroxy (Vit-D Deficiency, Fractures) -     B12 and Folate Panel -     CBC -     Basic metabolic panel -     Lipid panel  Mixed hyperlipidemia Assessment & Plan: Ordered lipid panel, pending results. Work on low cholesterol diet and exercise as tolerated   Orders: -     Lipid panel  Other fatigue -     TSH  Concentration deficit -     Ambulatory referral to Psychiatry      Follow-up: Return in  about 1 year (around 12/25/2023) for f/u CPE.   Mort Sawyers, FNP

## 2022-12-30 DIAGNOSIS — Z0001 Encounter for general adult medical examination with abnormal findings: Secondary | ICD-10-CM | POA: Insufficient documentation

## 2022-12-30 DIAGNOSIS — E538 Deficiency of other specified B group vitamins: Secondary | ICD-10-CM | POA: Insufficient documentation

## 2022-12-30 DIAGNOSIS — E782 Mixed hyperlipidemia: Secondary | ICD-10-CM | POA: Insufficient documentation

## 2022-12-30 DIAGNOSIS — W57XXXA Bitten or stung by nonvenomous insect and other nonvenomous arthropods, initial encounter: Secondary | ICD-10-CM | POA: Insufficient documentation

## 2022-12-30 NOTE — Assessment & Plan Note (Signed)
Patient Counseling(The following topics were reviewed): ? Preventative care handout given to pt  ?Health maintenance and immunizations reviewed. Please refer to Health maintenance section. ?Pt advised on safe sex, wearing seatbelts in car, and proper nutrition ?labwork ordered today for annual ?Dental health: Discussed importance of regular tooth brushing, flossing, and dental visits. ? ? ?

## 2022-12-30 NOTE — Assessment & Plan Note (Signed)
Ordered lipid panel, pending results. Work on low cholesterol diet and exercise as tolerated  

## 2022-12-30 NOTE — Assessment & Plan Note (Signed)
Ordered vitamin d pending results.   

## 2022-12-30 NOTE — Assessment & Plan Note (Signed)
Doxycycline 200 mg x one dose

## 2022-12-30 NOTE — Assessment & Plan Note (Signed)
Rechecking b12 pending results

## 2022-12-31 ENCOUNTER — Encounter: Payer: Self-pay | Admitting: *Deleted

## 2023-01-09 DIAGNOSIS — M7918 Myalgia, other site: Secondary | ICD-10-CM | POA: Diagnosis not present

## 2023-01-09 DIAGNOSIS — N3010 Interstitial cystitis (chronic) without hematuria: Secondary | ICD-10-CM | POA: Diagnosis not present

## 2023-01-09 DIAGNOSIS — R3915 Urgency of urination: Secondary | ICD-10-CM | POA: Diagnosis not present

## 2023-01-09 DIAGNOSIS — N3020 Other chronic cystitis without hematuria: Secondary | ICD-10-CM | POA: Diagnosis not present

## 2023-01-09 DIAGNOSIS — R350 Frequency of micturition: Secondary | ICD-10-CM | POA: Diagnosis not present

## 2023-01-19 ENCOUNTER — Ambulatory Visit: Payer: Medicaid Other | Admitting: Family

## 2023-01-20 ENCOUNTER — Ambulatory Visit: Payer: Medicaid Other | Admitting: Family

## 2023-01-25 ENCOUNTER — Emergency Department
Admission: EM | Admit: 2023-01-25 | Discharge: 2023-01-25 | Disposition: A | Discharge status: Home / Self Care | Payer: Medicaid Other | Attending: Emergency Medicine | Admitting: Emergency Medicine

## 2023-01-25 ENCOUNTER — Other Ambulatory Visit: Payer: Self-pay

## 2023-01-25 DIAGNOSIS — N3090 Cystitis, unspecified without hematuria: Secondary | ICD-10-CM | POA: Insufficient documentation

## 2023-01-25 DIAGNOSIS — R300 Dysuria: Secondary | ICD-10-CM | POA: Diagnosis present

## 2023-01-25 LAB — URINALYSIS, ROUTINE W REFLEX MICROSCOPIC
RBC / HPF: >50 RBC/hpf (ref 0–5)
Specific Gravity, Urine: 1.015 (ref 1.005–1.030)
Squamous Epithelial / HPF: NONE SEEN /HPF (ref 0–5)
WBC, UA: >50 WBC/hpf (ref 0–5)

## 2023-01-25 MED ORDER — KETOROLAC TROMETHAMINE 15 MG/ML IJ SOLN
15.0000 mg | Freq: Once | INTRAMUSCULAR | Status: AC
  Administered 2023-01-25: 15 mg via INTRAMUSCULAR
  Filled 2023-01-25: qty 1

## 2023-01-25 MED ORDER — SULFAMETHOXAZOLE-TRIMETHOPRIM 800-160 MG PO TABS: 1.0000 | ORAL_TABLET | Freq: Two times a day (BID) | ORAL | 0 refills | Status: DC

## 2023-01-25 MED ORDER — SULFAMETHOXAZOLE-TRIMETHOPRIM 800-160 MG PO TABS
1.0000 | ORAL_TABLET | Freq: Once | ORAL | Status: AC
  Administered 2023-01-25: 1 via ORAL
  Filled 2023-01-25: qty 1
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
## 2023-01-25 NOTE — ED Triage Notes (Signed)
First Nurse Note:  Pt via POV from [facility]. Pt had bladder surgery 2 weeks ago. Pt c/o dysuria with possible UTI. Pt was sent over here because pt is on a medication that turns her urine blue, so the doctor said that their system would not be able to read it.

## 2023-01-25 NOTE — Discharge Instructions (Addendum)
Please take the antibiotic twice a day for the next 3 days.  He can take the Vicodin you are prescribed for pain.  If you have fevers or develop flank pain and please return to the emergency department.  Follow-up with your urologist.

## 2023-01-25 NOTE — ED Triage Notes (Signed)
Pt to ed from [facility] via POV for urine issues. Pt had bladder surgery 2 weeks ago and is now having UTI symptoms. Pt is CAOx4, in no acute distress and ambulatory in triage.

## 2023-01-25 NOTE — ED Notes (Signed)
Pt advised she just urinated at [facility] and is unable to void at this time. Given a cup for when she felt the need to go and was instructed to bring cup to front desk when done.

## 2023-01-26 LAB — URINE CULTURE: Culture: 20000 — AB

## 2023-01-27 ENCOUNTER — Encounter: Payer: Self-pay | Admitting: Family

## 2023-01-27 DIAGNOSIS — N3001 Acute cystitis with hematuria: Secondary | ICD-10-CM

## 2023-01-27 LAB — URINE CULTURE

## 2023-01-28 MED ORDER — SULFAMETHOXAZOLE-TRIMETHOPRIM 800-160 MG PO TABS
1.0000 | ORAL_TABLET | Freq: Two times a day (BID) | ORAL | 0 refills | Status: AC
Start: 2023-01-28 — End: 2023-02-01

## 2023-01-28 NOTE — Addendum Note (Signed)
Addended by: Mort Sawyers on: 01/28/2023 09:09 AM   Modules accepted: Orders

## 2023-02-16 DIAGNOSIS — F411 Generalized anxiety disorder: Secondary | ICD-10-CM | POA: Diagnosis not present

## 2023-02-16 DIAGNOSIS — F909 Attention-deficit hyperactivity disorder, unspecified type: Secondary | ICD-10-CM | POA: Diagnosis not present

## 2023-03-02 DIAGNOSIS — F900 Attention-deficit hyperactivity disorder, predominantly inattentive type: Secondary | ICD-10-CM | POA: Diagnosis not present

## 2023-03-03 DIAGNOSIS — N3010 Interstitial cystitis (chronic) without hematuria: Secondary | ICD-10-CM | POA: Diagnosis not present

## 2023-03-04 DIAGNOSIS — F900 Attention-deficit hyperactivity disorder, predominantly inattentive type: Secondary | ICD-10-CM | POA: Diagnosis not present

## 2023-03-04 DIAGNOSIS — F411 Generalized anxiety disorder: Secondary | ICD-10-CM | POA: Diagnosis not present

## 2023-03-10 ENCOUNTER — Other Ambulatory Visit (HOSPITAL_COMMUNITY)
Admission: RE | Admit: 2023-03-10 | Discharge: 2023-03-10 | Disposition: A | Discharge status: Home / Self Care | Payer: Medicaid Other | Source: Other Acute Inpatient Hospital | Attending: Obstetrics and Gynecology | Admitting: Obstetrics and Gynecology

## 2023-03-10 ENCOUNTER — Ambulatory Visit: Payer: Medicaid Other | Admitting: Obstetrics and Gynecology

## 2023-03-10 ENCOUNTER — Other Ambulatory Visit: Payer: Self-pay | Admitting: Obstetrics and Gynecology

## 2023-03-10 ENCOUNTER — Encounter: Payer: Self-pay | Admitting: Obstetrics and Gynecology

## 2023-03-10 ENCOUNTER — Other Ambulatory Visit (HOSPITAL_COMMUNITY)
Admission: RE | Admit: 2023-03-10 | Discharge: 2023-03-10 | Disposition: A | Discharge status: Home / Self Care | Payer: Medicaid Other | Source: Ambulatory Visit | Attending: Obstetrics and Gynecology | Admitting: Obstetrics and Gynecology

## 2023-03-10 VITALS — BP 111/77 | HR 101 | Ht 66.5 in | Wt 121.0 lb

## 2023-03-10 DIAGNOSIS — R350 Frequency of micturition: Secondary | ICD-10-CM

## 2023-03-10 DIAGNOSIS — M62838 Other muscle spasm: Secondary | ICD-10-CM | POA: Diagnosis not present

## 2023-03-10 DIAGNOSIS — B379 Candidiasis, unspecified: Secondary | ICD-10-CM | POA: Diagnosis not present

## 2023-03-10 DIAGNOSIS — N368 Other specified disorders of urethra: Secondary | ICD-10-CM

## 2023-03-10 DIAGNOSIS — R3989 Other symptoms and signs involving the genitourinary system: Secondary | ICD-10-CM

## 2023-03-10 DIAGNOSIS — N323 Diverticulum of bladder: Secondary | ICD-10-CM
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
## 2023-03-11 ENCOUNTER — Encounter: Payer: Self-pay | Admitting: Obstetrics and Gynecology

## 2023-03-11 DIAGNOSIS — F411 Generalized anxiety disorder: Secondary | ICD-10-CM | POA: Diagnosis not present

## 2023-03-11 DIAGNOSIS — F900 Attention-deficit hyperactivity disorder, predominantly inattentive type: Secondary | ICD-10-CM | POA: Diagnosis not present

## 2023-03-11 LAB — POCT URINALYSIS DIPSTICK
Bilirubin, UA: NEGATIVE
Glucose, UA: NEGATIVE
Ketones, UA: NEGATIVE
Leukocytes, UA: NEGATIVE
Nitrite, UA: NEGATIVE
Protein, UA: NEGATIVE
Spec Grav, UA: 1.025 (ref 1.010–1.025)
Urobilinogen, UA: 0.2 E.U./dL
pH, UA: 5.5 (ref 5.0–8.0)

## 2023-03-13 ENCOUNTER — Encounter: Payer: Self-pay | Admitting: Obstetrics and Gynecology

## 2023-03-13 MED ORDER — TERCONAZOLE 0.4 % VA CREA
1.0000 | TOPICAL_CREAM | Freq: Every day | VAGINAL | 0 refills | Status: AC
Start: 2023-03-13 — End: ?

## 2023-03-13 NOTE — Addendum Note (Signed)
Addended by: Marguerita Beards on: 03/13/2023 05:05 PM   Modules accepted: Orders

## 2023-03-17 ENCOUNTER — Encounter: Payer: Medicaid Other | Attending: Obstetrics and Gynecology | Admitting: Physical Therapy

## 2023-03-17 ENCOUNTER — Encounter: Payer: Self-pay | Admitting: Physical Therapy

## 2023-03-17 ENCOUNTER — Other Ambulatory Visit: Payer: Self-pay

## 2023-03-17 ENCOUNTER — Ambulatory Visit
Admission: RE | Admit: 2023-03-17 | Discharge: 2023-03-17 | Disposition: A | Discharge status: Home / Self Care | Payer: Medicaid Other | Source: Ambulatory Visit | Attending: Obstetrics and Gynecology | Admitting: Obstetrics and Gynecology

## 2023-03-17 DIAGNOSIS — R102 Pelvic and perineal pain: Secondary | ICD-10-CM | POA: Diagnosis not present

## 2023-03-17 DIAGNOSIS — R252 Cramp and spasm: Secondary | ICD-10-CM | POA: Diagnosis not present

## 2023-03-17 DIAGNOSIS — R109 Unspecified abdominal pain: Secondary | ICD-10-CM | POA: Diagnosis not present

## 2023-03-17 DIAGNOSIS — N323 Diverticulum of bladder: Secondary | ICD-10-CM | POA: Diagnosis not present

## 2023-03-17 DIAGNOSIS — N368 Other specified disorders of urethra: Secondary | ICD-10-CM | POA: Diagnosis not present

## 2023-03-17 DIAGNOSIS — N3289 Other specified disorders of bladder: Secondary | ICD-10-CM | POA: Diagnosis not present

## 2023-03-17 MED ORDER — GADOBUTROL 1 MMOL/ML IV SOLN
5.0000 mL | Freq: Once | INTRAVENOUS | Status: AC | PRN
  Administered 2023-03-17: 5 mL via INTRAVENOUS
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
## 2023-03-26 DIAGNOSIS — F411 Generalized anxiety disorder: Secondary | ICD-10-CM | POA: Diagnosis not present

## 2023-03-26 DIAGNOSIS — F900 Attention-deficit hyperactivity disorder, predominantly inattentive type: Secondary | ICD-10-CM | POA: Diagnosis not present

## 2023-04-02 ENCOUNTER — Encounter: Payer: Medicaid Other | Admitting: Physical Therapy

## 2023-04-02 ENCOUNTER — Encounter: Payer: Self-pay | Admitting: Physical Therapy

## 2023-04-02 DIAGNOSIS — R102 Pelvic and perineal pain: Secondary | ICD-10-CM

## 2023-04-02 DIAGNOSIS — R252 Cramp and spasm: Secondary | ICD-10-CM | POA: Diagnosis not present
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
## 2023-04-07 ENCOUNTER — Ambulatory Visit: Payer: Medicaid Other | Admitting: Obstetrics and Gynecology

## 2023-04-09 DIAGNOSIS — F411 Generalized anxiety disorder: Secondary | ICD-10-CM | POA: Diagnosis not present

## 2023-04-09 DIAGNOSIS — F900 Attention-deficit hyperactivity disorder, predominantly inattentive type: Secondary | ICD-10-CM | POA: Diagnosis not present

## 2023-05-05 DIAGNOSIS — F411 Generalized anxiety disorder: Secondary | ICD-10-CM | POA: Diagnosis not present

## 2023-05-05 DIAGNOSIS — F900 Attention-deficit hyperactivity disorder, predominantly inattentive type: Secondary | ICD-10-CM | POA: Diagnosis not present

## 2023-05-06 ENCOUNTER — Other Ambulatory Visit (HOSPITAL_COMMUNITY)
Admission: RE | Admit: 2023-05-06 | Discharge: 2023-05-06 | Disposition: A | Discharge status: Home / Self Care | Payer: Medicaid Other | Source: Ambulatory Visit | Attending: Obstetrics and Gynecology | Admitting: Obstetrics and Gynecology

## 2023-05-06 ENCOUNTER — Encounter: Payer: Self-pay | Admitting: Obstetrics and Gynecology

## 2023-05-06 ENCOUNTER — Ambulatory Visit: Payer: Medicaid Other | Admitting: Obstetrics and Gynecology

## 2023-05-06 VITALS — BP 109/73 | HR 125

## 2023-05-06 DIAGNOSIS — R102 Pelvic and perineal pain: Secondary | ICD-10-CM | POA: Insufficient documentation

## 2023-05-06 DIAGNOSIS — R3989 Other symptoms and signs involving the genitourinary system: Secondary | ICD-10-CM

## 2023-05-06 NOTE — Progress Notes (Signed)
[facility] Return Visit  SUBJECTIVE  History of Present Illness: Sonia Smith is a 42 y.o. female seen in follow-up for urethral and vaginal pain. MRI was performed to assess urethra for possible cyst.   IMPRESSION: 1. Decompressed urinary bladder with mild, diffuse urinary bladder wall thickening suggesting nonspecific infectious or inflammatory cystitis. No diverticulum or other focal abnormality. 2. Status post hysterectomy. 3. Trace free fluid in the low pelvis, likely functional in the premenopausal setting.  When she was using the cream and using the boric acid suppositories she did not have the pain at the vaginal opening. But came back after treatment. She tried boric acid suppositories again and it improved but came back when she stopped.   She went to her initial pelvic PT appt and has more next week.   Past Medical History: Patient  has a past medical history of Acute non-recurrent sinusitis (09/05/2020), Anxiety dx'd 2019 (08/27/2020), Blood in stool, Childhood asthma, Cystic acne (05/21/2020), Depression, Depression, recurrent (HCC) dx'd 2019 (08/27/2020), DVT (deep venous thrombosis) (HCC), Dysplastic nevus (04/30/2007), Dysrhythmia, Eczema, Eustachian tube dysfunction, left (06/10/2021), GERD (gastroesophageal reflux disease), H/O: hysterectomy 11/2006 (08/27/2020), History of admission to inpatient psychiatry department 08/2017 (08/27/2020), Lupus, Migraines, Neuromuscular disorder (HCC), Painful intercourse, PTSD (post-traumatic stress disorder) dx'd 2019 (08/27/2020), Pudendal neuralgia, and Vaginal Pap smear, abnormal.   Past Surgical History: She  has a past surgical history that includes Abdominal hysterectomy (08/04/2006); LEEP (08/04/1996); Cervical cerclage (2001, 2004); Bladder tumor excision (2006, 05/2014); Hemorrhoid surgery (N/A, 11/06/2015); Breast biopsy (Left, 12/09/2022); Breast biopsy (Left, 12/09/2022); and Cystoscopy with hydrodistension  and biopsy.   Medications: She has a current medication list which includes the following prescription(s): acetaminophen-codeine, acyclovir, cholecalciferol, clindamycin, clonidine, docusate sodium, gabapentin, methylphenidate, trazodone, trazodone, and tretinoin.   Allergies: Patient is allergic to beef (bovine) protein, other, pork-derived products, adhesive [tape], alpha-gal, and wound dressing adhesive.   Social History: Patient  reports that she quit smoking about 23 years ago. Her smoking use included cigarettes, e-cigarettes, and cigars. She started smoking about 29 years ago. She has a 3 pack-year smoking history. She has never been exposed to tobacco smoke. She has never used smokeless tobacco. She reports current alcohol use of about 15.0 standard drinks of alcohol per week. She reports that she does not currently use drugs after having used the following drugs: Marijuana.      OBJECTIVE     Physical Exam: Vitals:   05/06/23 1418  BP: 109/73  Pulse: (!) 125   Gen: No apparent distress, A&O x 3.  Detailed Urogynecologic Evaluation:  Deferred. Aptima swab obtained   ASSESSMENT AND PLAN    Ms. Penza is a 42 y.o. with:  1. Vaginal pain   2. Pain in urethra    - Aptima swab obtained to look for recurrence of yeast since she has symptoms again.  - We discussed that if this is negative, can try gabapentin/ baclofen/ amitriptyline compounded cream  - continue with pelvic PT  Return 4 months  Marguerita Beards, MD

## 2023-05-07 ENCOUNTER — Encounter: Payer: Self-pay | Admitting: *Deleted

## 2023-05-07 MED ORDER — NONFORMULARY OR COMPOUNDED ITEM
5 refills | Status: DC
Start: 2023-05-07 — End: 2023-06-15

## 2023-05-07 NOTE — Progress Notes (Signed)
Prescription that Dr Florian Buff sent in for compound prescription printed this morning for culture results.  Prescription for amitriptyline 2.5%/gabepentin 2.5%/baclofen 2.5% vaginal cream faxed to [facility] per Dr Jari Favre note.  Sherrilyn Rist CMA

## 2023-05-07 NOTE — Addendum Note (Signed)
Addended by: Marguerita Beards on: 05/07/2023 03:12 PM   Modules accepted: Orders

## 2023-05-11 LAB — CERVICOVAGINAL ANCILLARY ONLY
Bacterial Vaginitis (gardnerella): NEGATIVE
Candida Glabrata: NEGATIVE
Candida Vaginitis: NEGATIVE
Comment: NEGATIVE
Comment: NEGATIVE
Comment: NEGATIVE

## 2023-05-13 ENCOUNTER — Encounter: Payer: Self-pay | Admitting: Family

## 2023-05-14 ENCOUNTER — Encounter: Payer: Medicaid Other | Admitting: Physical Therapy

## 2023-05-21 ENCOUNTER — Encounter: Payer: Medicaid Other | Attending: Obstetrics and Gynecology | Admitting: Physical Therapy

## 2023-05-21 ENCOUNTER — Encounter: Payer: Self-pay | Admitting: Physical Therapy

## 2023-05-21 DIAGNOSIS — R252 Cramp and spasm: Secondary | ICD-10-CM

## 2023-05-21 DIAGNOSIS — M62838 Other muscle spasm: Secondary | ICD-10-CM | POA: Insufficient documentation

## 2023-05-21 DIAGNOSIS — R102 Pelvic and perineal pain: Secondary | ICD-10-CM | POA: Insufficient documentation
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
## 2023-05-22 DIAGNOSIS — F900 Attention-deficit hyperactivity disorder, predominantly inattentive type: Secondary | ICD-10-CM | POA: Diagnosis not present

## 2023-05-22 DIAGNOSIS — F411 Generalized anxiety disorder: Secondary | ICD-10-CM | POA: Diagnosis not present

## 2023-05-28 ENCOUNTER — Encounter: Payer: Medicaid Other | Admitting: Physical Therapy

## 2023-05-28 ENCOUNTER — Encounter: Payer: Self-pay | Admitting: Physical Therapy

## 2023-05-28 DIAGNOSIS — M62838 Other muscle spasm: Secondary | ICD-10-CM | POA: Diagnosis not present

## 2023-05-28 DIAGNOSIS — R102 Pelvic and perineal pain: Secondary | ICD-10-CM

## 2023-05-28 DIAGNOSIS — R252 Cramp and spasm: Secondary | ICD-10-CM
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
## 2023-05-28 NOTE — Patient Instructions (Signed)

## 2023-06-03 DIAGNOSIS — F411 Generalized anxiety disorder: Secondary | ICD-10-CM | POA: Diagnosis not present

## 2023-06-03 DIAGNOSIS — F900 Attention-deficit hyperactivity disorder, predominantly inattentive type: Secondary | ICD-10-CM | POA: Diagnosis not present

## 2023-06-04 ENCOUNTER — Encounter: Payer: Self-pay | Admitting: Physical Therapy

## 2023-06-04 ENCOUNTER — Encounter: Payer: Medicaid Other | Admitting: Physical Therapy

## 2023-06-04 DIAGNOSIS — R102 Pelvic and perineal pain: Secondary | ICD-10-CM

## 2023-06-04 DIAGNOSIS — R252 Cramp and spasm: Secondary | ICD-10-CM

## 2023-06-04 DIAGNOSIS — M62838 Other muscle spasm: Secondary | ICD-10-CM | POA: Diagnosis not present
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
## 2023-06-10 ENCOUNTER — Emergency Department: Payer: Medicaid Other

## 2023-06-10 ENCOUNTER — Other Ambulatory Visit: Payer: Self-pay

## 2023-06-10 ENCOUNTER — Emergency Department
Admission: EM | Admit: 2023-06-10 | Discharge: 2023-06-11 | Disposition: A | Discharge status: Home / Self Care | Payer: Medicaid Other | Attending: Emergency Medicine | Admitting: Emergency Medicine

## 2023-06-10 DIAGNOSIS — R1032 Left lower quadrant pain: Secondary | ICD-10-CM | POA: Diagnosis not present

## 2023-06-10 DIAGNOSIS — R1031 Right lower quadrant pain: Secondary | ICD-10-CM | POA: Insufficient documentation

## 2023-06-10 DIAGNOSIS — B349 Viral infection, unspecified: Secondary | ICD-10-CM | POA: Insufficient documentation

## 2023-06-10 DIAGNOSIS — R000 Tachycardia, unspecified: Secondary | ICD-10-CM | POA: Diagnosis not present

## 2023-06-10 DIAGNOSIS — R52 Pain, unspecified: Secondary | ICD-10-CM

## 2023-06-10 DIAGNOSIS — M545 Low back pain, unspecified: Secondary | ICD-10-CM | POA: Diagnosis not present

## 2023-06-10 DIAGNOSIS — M791 Myalgia, unspecified site: Secondary | ICD-10-CM | POA: Diagnosis not present

## 2023-06-10 DIAGNOSIS — R509 Fever, unspecified: Secondary | ICD-10-CM | POA: Diagnosis present

## 2023-06-10 DIAGNOSIS — Z20822 Contact with and (suspected) exposure to covid-19: Secondary | ICD-10-CM | POA: Insufficient documentation

## 2023-06-10 LAB — COMPREHENSIVE METABOLIC PANEL
ALT: 10 U/L (ref 0–44)
AST: 15 U/L (ref 15–41)
Albumin: 4.5 g/dL (ref 3.5–5.0)
Alkaline Phosphatase: 61 U/L (ref 38–126)
Anion gap: 9 (ref 5–15)
BUN: <5 mg/dL — ABNORMAL LOW (ref 6–20)
CO2: 27 mmol/L (ref 22–32)
Calcium: 8.8 mg/dL — ABNORMAL LOW (ref 8.9–10.3)
Chloride: 99 mmol/L (ref 98–111)
Creatinine, Ser: 0.84 mg/dL (ref 0.44–1.00)
GFR, Estimated: >60 mL/min (ref >60)
Glucose, Bld: 124 mg/dL — ABNORMAL HIGH (ref 70–99)
Potassium: 3.4 mmol/L — ABNORMAL LOW (ref 3.5–5.1)
Sodium: 135 mmol/L (ref 135–145)
Total Bilirubin: 0.7 mg/dL (ref <1.2)
Total Protein: 7.4 g/dL (ref 6.5–8.1)

## 2023-06-10 LAB — CBC WITH DIFFERENTIAL/PLATELET
Abs Immature Granulocytes: 0.04 10*3/uL (ref 0.00–0.07)
Basophils Absolute: 0.1 10*3/uL (ref 0.0–0.1)
Basophils Relative: 1 %
Eosinophils Absolute: 0 10*3/uL (ref 0.0–0.5)
Eosinophils Relative: 0 %
HCT: 45.2 % (ref 36.0–46.0)
Hemoglobin: 15.6 g/dL — ABNORMAL HIGH (ref 12.0–15.0)
Immature Granulocytes: 0 %
Lymphocytes Relative: 9 %
Lymphs Abs: 0.9 10*3/uL (ref 0.7–4.0)
MCH: 33.4 pg (ref 26.0–34.0)
MCHC: 34.5 g/dL (ref 30.0–36.0)
MCV: 96.8 fL (ref 80.0–100.0)
Monocytes Absolute: 0.4 10*3/uL (ref 0.1–1.0)
Monocytes Relative: 4 %
Neutro Abs: 8.7 10*3/uL — ABNORMAL HIGH (ref 1.7–7.7)
Neutrophils Relative %: 86 %
Platelets: 223 10*3/uL (ref 150–400)
RBC: 4.67 MIL/uL (ref 3.87–5.11)
RDW: 11.6 % (ref 11.5–15.5)
WBC: 10.2 10*3/uL (ref 4.0–10.5)
nRBC: 0 % (ref 0.0–0.2)

## 2023-06-10 LAB — URINALYSIS, ROUTINE W REFLEX MICROSCOPIC
Bacteria, UA: NONE SEEN
Bilirubin Urine: NEGATIVE
Glucose, UA: NEGATIVE mg/dL
Ketones, ur: NEGATIVE mg/dL
Leukocytes,Ua: NEGATIVE
Nitrite: NEGATIVE
Protein, ur: NEGATIVE mg/dL
Specific Gravity, Urine: 1.003 — ABNORMAL LOW (ref 1.005–1.030)
pH: 5 (ref 5.0–8.0)

## 2023-06-10 LAB — POC URINE PREG, ED: Preg Test, Ur: NEGATIVE

## 2023-06-10 LAB — RESP PANEL BY RT-PCR (RSV, FLU A&B, COVID)  RVPGX2
Influenza A by PCR: NEGATIVE
Influenza B by PCR: NEGATIVE
Resp Syncytial Virus by PCR: NEGATIVE
SARS Coronavirus 2 by RT PCR: NEGATIVE

## 2023-06-10 LAB — LIPASE, BLOOD: Lipase: 26 U/L (ref 11–51)

## 2023-06-10 LAB — LACTIC ACID, PLASMA
Lactic Acid, Venous: 2.1 mmol/L (ref 0.5–1.9)
Lactic Acid, Venous: 1 mmol/L (ref 0.5–1.9)

## 2023-06-10 MED ORDER — MORPHINE SULFATE (PF) 4 MG/ML IV SOLN
4.0000 mg | Freq: Once | INTRAVENOUS | Status: AC
  Administered 2023-06-10: 4 mg via INTRAVENOUS
  Filled 2023-06-10: qty 1

## 2023-06-10 MED ORDER — ONDANSETRON HCL 4 MG/2ML IJ SOLN
4.0000 mg | Freq: Once | INTRAMUSCULAR | Status: AC
  Administered 2023-06-10: 4 mg via INTRAVENOUS
  Filled 2023-06-10: qty 2

## 2023-06-10 MED ORDER — ONDANSETRON HCL 4 MG PO TABS: 4.0000 mg | ORAL_TABLET | Freq: Three times a day (TID) | ORAL | 0 refills | Status: DC | PRN

## 2023-06-10 MED ORDER — ACETAMINOPHEN 325 MG PO TABS
650.0000 mg | ORAL_TABLET | Freq: Once | ORAL | Status: AC
  Administered 2023-06-10: 650 mg via ORAL
  Filled 2023-06-10: qty 2

## 2023-06-10 MED ORDER — LACTATED RINGERS IV BOLUS
1000.0000 mL | Freq: Once | INTRAVENOUS | Status: AC
  Administered 2023-06-10: 1000 mL via INTRAVENOUS

## 2023-06-10 MED ORDER — IOHEXOL 300 MG/ML  SOLN
100.0000 mL | Freq: Once | INTRAMUSCULAR | Status: AC | PRN
  Administered 2023-06-10: 100 mL via INTRAVENOUS

## 2023-06-10 NOTE — Discharge Instructions (Signed)
You can continue taking Tylenol and ibuprofen as needed to help with pain and fever symptoms at home.  Please return for any worsening symptoms.  I have sent antinausea medication to your pharmacy for you to take as needed every 8 hours.

## 2023-06-10 NOTE — ED Triage Notes (Signed)
Pt reports lower back pain since this morning, pain has began to radiate into the front of her lower abd and radiate into her groin and down her legs. Pt reports developing "chest tingling" a few hours ago and shortness of breath. Pain is worse with inspiration. Pt denies cough or congestion. Pt mild discomfort while urinating and strong smelling urine.

## 2023-06-10 NOTE — ED Provider Notes (Signed)
[facility] Provider Note    Event Date/Time   First MD Initiated Contact with Patient 06/10/23 2050     (approximate)   History   Back Pain   HPI Sonia Smith is a 42 y.o. female presenting today for low back pain and lower abdominal pain.  Patient states symptom onset earlier today which is mostly noted in her left lower back.  She then stated pain started radiating around to her left lower abdomen as well.  She has felt feverish and bodyaches.  Has had nausea but no vomiting.  Otherwise denies chest pain, shortness of breath, diarrhea, constipation.  Has a history of neuropathic pain and urinary incontinence and therefore cannot feel if she is having dysuria.  Denies any trauma to her back.  States that she felt like this when she had COVID.  No history of IV drug use.  Reviewed most recent chart notes.     Physical Exam   Triage Vital Signs: ED Triage Vitals  Encounter Vitals Group     BP 06/10/23 1939 (!) 144/100     Systolic BP Percentile --      Diastolic BP Percentile --      Pulse Rate 06/10/23 1939 (!) 135     Resp 06/10/23 1939 (!) 21     Temp 06/10/23 1939 (!) 100.8 F (38.2 C)     Temp Source 06/10/23 1939 Oral     SpO2 06/10/23 1939 98 %     Weight 06/10/23 1949 120 lb (54.4 kg)     Height 06/10/23 1949 5\' 7"  (1.702 m)     Head Circumference --      Peak Flow --      Pain Score 06/10/23 1948 8     Pain Loc --      Pain Education --      Exclude from Growth Chart --     Most recent vital signs: Vitals:   06/10/23 2300 06/11/23 0000  BP: 115/66 116/70  Pulse: 86 74  Resp: 15 (!) 23  Temp: 99 F (37.2 C)   SpO2: 98% 97%   Physical Exam: I have reviewed the vital signs and nursing notes. General: Awake, alert, no acute distress.  Fatigued appearing. Head:  Atraumatic, normocephalic.   ENT:  EOM intact, PERRL. Oral mucosa is pink and moist with no lesions. Neck: Neck is supple with full range of motion, No meningeal  signs. Cardiovascular:  RRR, No murmurs. Peripheral pulses palpable and equal bilaterally. Respiratory:  Symmetrical chest wall expansion.  No rhonchi, rales, or wheezes.  Good air movement throughout.  No use of accessory muscles.   Musculoskeletal:  No cyanosis or edema. Moving extremities with full ROM.  No tenderness to thoracic or lumbar spine. Abdomen:  Soft, tenderness to palpation bilateral lower quadrants, positive left-sided CVA tenderness, nondistended. Neuro:  GCS 15, moving all four extremities, interacting appropriately. Speech clear. Psych:  Calm, appropriate.   Skin:  Warm, dry, no rash.    ED Results / Procedures / Treatments   Labs (all labs ordered are listed, but only abnormal results are displayed) Labs Reviewed  CBC WITH DIFFERENTIAL/PLATELET - Abnormal; Notable for the following components:      Result Value   Hemoglobin 15.6 (*)    Neutro Abs 8.7 (*)    All other components within normal limits  COMPREHENSIVE METABOLIC PANEL - Abnormal; Notable for the following components:   Potassium 3.4 (*)    Glucose, Bld 124 (*)  BUN <5 (*)    Calcium 8.8 (*)    All other components within normal limits  URINALYSIS, ROUTINE W REFLEX MICROSCOPIC - Abnormal; Notable for the following components:   Color, Urine STRAW (*)    APPearance CLEAR (*)    Specific Gravity, Urine 1.003 (*)    Hgb urine dipstick MODERATE (*)    All other components within normal limits  LACTIC ACID, PLASMA - Abnormal; Notable for the following components:   Lactic Acid, Venous 2.1 (*)    All other components within normal limits  RESP PANEL BY RT-PCR (RSV, FLU A&B, COVID)  RVPGX2  LACTIC ACID, PLASMA  LIPASE, BLOOD  POC URINE PREG, ED     EKG My EKG interpretation: Rate of 134, sinus tachycardia, left axis deviation.  No acute ST elevations or depressions   RADIOLOGY Independently interpreted CT imaging with no acute pathology   PROCEDURES:  Critical Care performed:  No  Procedures   MEDICATIONS ORDERED IN ED: Medications  acetaminophen (TYLENOL) tablet 650 mg (650 mg Oral Given 06/10/23 1959)  lactated ringers bolus 1,000 mL (0 mLs Intravenous Stopped 06/10/23 2319)  morphine (PF) 4 MG/ML injection 4 mg (4 mg Intravenous Given 06/10/23 2202)  ondansetron (ZOFRAN) injection 4 mg (4 mg Intravenous Given 06/10/23 2202)  iohexol (OMNIPAQUE) 300 MG/ML solution 100 mL (100 mLs Intravenous Contrast Given 06/10/23 2213)     IMPRESSION / MDM / ASSESSMENT AND PLAN / ED COURSE  I reviewed the triage vital signs and the nursing notes.                              Differential diagnosis includes, but is not limited to, viral URI, nephrolithiasis, appendicitis, diverticulitis, cystitis, pyelonephritis.  Patient's presentation is most consistent with acute complicated illness / injury requiring diagnostic workup.  Patient is a 42 year old female presenting today for fever, generalized bodyaches, nausea, vomiting, and abdominal pain.  Physical exam was notable for left flank pain and left lower quadrant abdominal pain.  Vital signs initially notable for fever and tachycardia which did improve with fluids, Tylenol, pain and nausea medicine.  Laboratory workup mostly reassuring.  No leukocytosis.  No acute cystitis noted.  Viral panel negative.  Given tenderness palpation on abdomen, CT abdomen/pelvis was ordered.  This shows no acute intra-abdominal pathology.  Patient was reassessed following medications and feeling significantly better.  Given reassuring physical exam on reassessment and stabilized vital signs, most likely suspecting viral illness as a source of her symptoms with nothing else acutely seen in laboratory workup or CT imaging.  Patient given antinausea medication and discussed strict return precautions.  Told to follow-up with her PCP.  The patient is on the cardiac monitor to evaluate for evidence of arrhythmia and/or significant heart rate  changes. Clinical Course as of 06/13/23 1104  Wed Jun 10, 2023  2256 Reassessed patient.  Feeling significantly better at this time.  No pain symptoms.  No nausea. [DW]    Clinical Course User Index [DW] Janith Lima, MD     FINAL CLINICAL IMPRESSION(S) / ED DIAGNOSES   Final diagnoses:  Viral illness  Body aches  Fever, unspecified fever cause     Rx / DC Orders   ED Discharge Orders          Ordered    ondansetron (ZOFRAN) 4 MG tablet  Every 8 hours PRN        06/10/23 2315  Note:  This document was prepared using Dragon voice recognition software and may include unintentional dictation errors.   Janith Lima, MD 06/13/23 1105

## 2023-06-11 ENCOUNTER — Encounter: Payer: Self-pay | Admitting: Family

## 2023-06-11 ENCOUNTER — Encounter: Payer: Medicaid Other | Attending: Obstetrics and Gynecology | Admitting: Physical Therapy

## 2023-06-11 NOTE — Telephone Encounter (Signed)
Not abn to have tachycardia especially when with a fever (elevated heart rate) she could also consider following up again with her cardiologist. However make sure she has a hospital f/u appt with me and we can discuss details further.

## 2023-06-11 NOTE — ED Notes (Signed)
 Patient discharged from ED by provider. Discharge instructions reviewed with patient and all questions answered. Patient ambulatory from ED in NAD.

## 2023-06-11 NOTE — ED Provider Notes (Signed)
Care of this patient assumed from prior physician at 2300 pending COVID swab and anticipated discharge. Please see prior physician note for further details.  Briefly this is a 42 year old female who presented with low back and abdominal pain found to be febrile and tachycardic on presentation.  Normal white blood cell count and lactate.  Vital signs significantly improved after fluids and Tylenol. CT scan without acute findings.  Patient reported similar symptoms in the setting of prior COVID infection, so viral swab was sent, pending at time of signout.  This did return negative.  Patient remained with improved vitals on reevaluation.  She is comfortable with discharge home.  Strict return precautions provided.  Antiemetics sent by prior provider.  Patient discharged in stable condition.   Trinna Post, MD 06/11/23 [phone]

## 2023-06-12 NOTE — Telephone Encounter (Signed)
Noted  

## 2023-06-15 ENCOUNTER — Ambulatory Visit: Payer: Medicaid Other | Attending: Family

## 2023-06-15 ENCOUNTER — Encounter: Payer: Self-pay | Admitting: Family

## 2023-06-15 ENCOUNTER — Ambulatory Visit: Payer: Medicaid Other | Admitting: Family

## 2023-06-15 VITALS — BP 96/62 | HR 94 | Temp 98.0°F | Ht 67.0 in | Wt 114.0 lb

## 2023-06-15 DIAGNOSIS — R311 Benign essential microscopic hematuria: Secondary | ICD-10-CM | POA: Insufficient documentation

## 2023-06-15 DIAGNOSIS — R002 Palpitations: Secondary | ICD-10-CM

## 2023-06-15 DIAGNOSIS — R0789 Other chest pain: Secondary | ICD-10-CM

## 2023-06-15 DIAGNOSIS — L2489 Irritant contact dermatitis due to other agents: Secondary | ICD-10-CM | POA: Insufficient documentation

## 2023-06-15 DIAGNOSIS — Z8249 Family history of ischemic heart disease and other diseases of the circulatory system: Secondary | ICD-10-CM | POA: Diagnosis not present

## 2023-06-15 DIAGNOSIS — E440 Moderate protein-calorie malnutrition: Secondary | ICD-10-CM | POA: Diagnosis not present

## 2023-06-15 MED ORDER — OMEPRAZOLE 20 MG PO CPDR: 20.0000 mg | DELAYED_RELEASE_CAPSULE | Freq: Every day | ORAL | 0 refills | Status: DC

## 2023-06-15 MED ORDER — TRIAMCINOLONE ACETONIDE 0.1 % EX CREA: 1.0000 | TOPICAL_CREAM | Freq: Two times a day (BID) | CUTANEOUS | 0 refills | Status: DC

## 2023-06-15 NOTE — Patient Instructions (Addendum)
  Schedule a follow up appt with cardiology in about 3-4 weeks after starting the zio monitor.   Call psychiatry to let them know concerta is affecting your heart rate    Cardiologist:  Steffanie Dunn, MD [address] [place], [place], [place] [address]  5.7 mi [phone]

## 2023-06-15 NOTE — Progress Notes (Unsigned)
Established Patient Office Visit  Subjective:      CC:  Chief Complaint  Patient presents with   Follow-up    ED follow up - [facility] ED 06/10/2023    HPI: Sonia Smith is a 42 y.o. female presenting on 06/15/2023 for Follow-up (ED follow up - [facility] ED 06/10/2023) . Pt is here for ER f/u went to [facility] 06/10/23 for c/o low back pain and lower abdominal pain, also feeling feverish and with bodyaches and nausea. EKG with tachycardia, HR 134, no acute findings, sinus. CT abd pelvis , no acute findings. CT lumbar spine 11/6 with left convex scoliosis. Discharged with rx for zofran 4 mg tid prn  Labs reassuring., negative for covid. Urine with hematuria, moderate, but negative leukocytes.   Was seen 4/5 for syncopal episode, suspected to be vasovagal per cardiology note was encouraged to stay hydrated with water intake and try to utilize counter pressure maneuvers when symptoms arise.   HR today 94 bpm. She does at times feel like her 'heart is feeling funny' which she feels in the mid chest area slightly to the right , and she feels something is stabbing her and then feels like an electrical shooting pain and then at times she will feel numbness elbow down with tingling. These do seem to correlate together, she notices these a few times a week. She also states her heart rate seems to elevate and feel like its racing when she goes to a standing position. Again, EKG was reassuring , did show right atrial enlargement and left axis deviation.  She is worried because her MGM with heart attack in her 28's , with repeat heart attack 5 years later resulting in triple bypass with stent placement. Maternal aunt had stents placed as well for possible blockage. she denies heart burn but will throw up if she eats tomato based food.   Pt does state since being discharged from the hospital she is feeling much better. No longer with fever however she does note a blistering itchy lesion left lower abdomen. Did  try some otc hydrocortisone cream without improvement. Did use some 5% lidocaine gel with some slight improvement in the itching.    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 She does see a psychiatrist. This does also seem to affect her sleep.  Decreased appetite, losing weight again.         Social history:  Relevant past medical, surgical, family and social history reviewed and updated as indicated. Interim medical history since our last visit reviewed.  Allergies and medications reviewed and updated.  DATA REVIEWED: CHART IN EPIC     ROS: Negative unless specifically indicated above in HPI.    Current Outpatient Medications:    acetaminophen-codeine (TYLENOL #4) 300-60 MG tablet, Take 1 tablet by mouth every 4 (four) hours as needed., Disp: , Rfl:    acyclovir (ZOVIRAX) 800 MG tablet, TAKE 1 TABLET BY MOUTH EVERY MORNING, Disp: 90 tablet, Rfl: 2   cholecalciferol (VITAMIN D3) 25 MCG (1000 UNIT) tablet, Take 1,000 Units by mouth once a week., Disp: , Rfl:    clindamycin (CLEOCIN T) 1 % external solution, Apply topically daily., Disp: 30 mL, Rfl: 3   docusate sodium (COLACE) 100 MG capsule, Take 100 mg by mouth 2 (two) times daily., Disp: , Rfl:    folic acid (FOLVITE) 400 MCG tablet, Take 400 mcg by mouth 2 (two) times a week., Disp: , Rfl:  gabapentin (NEURONTIN) 800 MG tablet, Take 1,200 mg by mouth 2 (two) times daily., Disp: , Rfl:    methylphenidate 27 MG PO CR tablet, Take 27 mg by mouth every morning., Disp: , Rfl:    omeprazole (PRILOSEC) 20 MG capsule, Take 1 capsule (20 mg total) by mouth daily for 14 days., Disp: 14 capsule, Rfl: 0   traZODone (DESYREL) 100 MG tablet, Take 100 mg by mouth at bedtime., Disp: , Rfl:    tretinoin (RETIN-A) 0.025 % cream, Apply topically at bedtime., Disp: 45 g, Rfl: 1   triamcinolone cream (KENALOG) 0.1 %, Apply 1 Application topically 2 (two) times daily., Disp: 30 g, Rfl: 0      Objective:    BP 96/62 (BP  Location: Left Arm, Patient Position: Sitting, Cuff Size: Normal)   Pulse 94   Temp 98 F (36.7 C) (Temporal)   Ht 5\' 7"  (1.702 m)   Wt 114 lb (51.7 kg)   SpO2 94%   BMI 17.85 kg/m   Wt Readings from Last 3 Encounters:  06/15/23 114 lb (51.7 kg)  06/10/23 120 lb (54.4 kg)  03/10/23 121 lb (54.9 kg)    Physical Exam Constitutional:      General: She is not in acute distress.    Appearance: Normal appearance. She is normal weight. She is not ill-appearing, toxic-appearing or diaphoretic.  HENT:     Head: Normocephalic.     Right Ear: Tympanic membrane normal.     Left Ear: Tympanic membrane normal.     Nose: Nose normal.     Mouth/Throat:     Mouth: Mucous membranes are dry.     Pharynx: No oropharyngeal exudate or posterior oropharyngeal erythema.  Eyes:     Extraocular Movements: Extraocular movements intact.     Pupils: Pupils are equal, round, and reactive to light.  Cardiovascular:     Rate and Rhythm: Normal rate and regular rhythm.     Pulses: Normal pulses.     Heart sounds: Normal heart sounds.  Pulmonary:     Effort: Pulmonary effort is normal.     Breath sounds: Normal breath sounds.  Musculoskeletal:     Cervical back: Normal range of motion.  Skin:    Comments: Left lower suprapubic area with raised erythematic rash with small vesicular lesions approximately 2.5 cm in diameter.   Neurological:     General: No focal deficit present.     Mental Status: She is alert and oriented to person, place, and time. Mental status is at baseline.  Psychiatric:        Mood and Affect: Mood normal.        Behavior: Behavior normal.        Thought Content: Thought content normal.        Judgment: Judgment normal.         Wt Readings from Last 3 Encounters:  06/15/23 114 lb (51.7 kg)  06/10/23 120 lb (54.4 kg)  03/10/23 121 lb (54.9 kg)   Temp Readings from Last 3 Encounters:  06/15/23 98 F (36.7 C) (Temporal)  06/10/23 99 F (37.2 C) (Oral)  01/25/23 98.4  F (36.9 C) (Oral)   BP Readings from Last 3 Encounters:  06/15/23 96/62  06/11/23 116/70  05/06/23 109/73   Pulse Readings from Last 3 Encounters:  06/15/23 94  06/11/23 74  05/06/23 (!) 125      Assessment & Plan:  Benign essential microscopic hematuria  Irritant contact dermatitis due to other agents Assessment &  Plan: Rx Triamcinolone cream 0.1% Pt advised to:  Please monitor site for worsening signs/symptoms of infection to include: increasing redness, increasing tenderness, increase in size, and or pustulant drainage from site. If this is to occur please let me know immediately.    Orders: -     Triamcinolone Acetonide; Apply 1 Application topically 2 (two) times daily.  Dispense: 30 g; Refill: 0  Palpitations Assessment & Plan: Zio monitor ordered pending results.   Orders: -     LONG TERM MONITOR (3-14 DAYS); Future  Family history of heart attack  Other chest pain Assessment & Plan: Suspect GERD, EKG reviewed from ER reassuring.  Trial omeprazole 20 mg once daily.  Advised pt to also f/u with cardiology to further evaluate as well as pt also with family history MI age 72.   Orders: -     Omeprazole; Take 1 capsule (20 mg total) by mouth daily for 14 days.  Dispense: 14 capsule; Refill: 0  Malnutrition of moderate degree (HCC) Assessment & Plan: Discussed food intake and increasing protein.  Will consider nutritionist referral for weight gain discussion.       Return in about 3 weeks (around 07/06/2023) for follow up epigastric pain .  Mort Sawyers, MSN, APRN, FNP-C  [facility]

## 2023-06-16 ENCOUNTER — Encounter: Payer: Self-pay | Admitting: Family

## 2023-06-16 DIAGNOSIS — E440 Moderate protein-calorie malnutrition: Secondary | ICD-10-CM

## 2023-06-16 DIAGNOSIS — Z681 Body mass index (BMI) 19 or less, adult: Secondary | ICD-10-CM

## 2023-06-16 NOTE — Assessment & Plan Note (Addendum)
Discussed food intake and increasing protein.  Will consider nutritionist referral for weight gain discussion.

## 2023-06-16 NOTE — Assessment & Plan Note (Signed)
Zio monitor ordered pending results.

## 2023-06-16 NOTE — Assessment & Plan Note (Signed)
Rx Triamcinolone cream 0.1% Pt advised to:  Please monitor site for worsening signs/symptoms of infection to include: increasing redness, increasing tenderness, increase in size, and or pustulant drainage from site. If this is to occur please let me know immediately.

## 2023-06-16 NOTE — Assessment & Plan Note (Addendum)
Suspect GERD, EKG reviewed from ER reassuring.  Trial omeprazole 20 mg once daily.  Advised pt to also f/u with cardiology to further evaluate as well as pt also with family history MI age 42.

## 2023-06-18 ENCOUNTER — Encounter: Payer: Self-pay | Admitting: Physical Therapy

## 2023-06-18 ENCOUNTER — Ambulatory Visit: Payer: Medicaid Other | Attending: Obstetrics and Gynecology | Admitting: Physical Therapy

## 2023-06-18 DIAGNOSIS — R002 Palpitations: Secondary | ICD-10-CM | POA: Diagnosis not present

## 2023-06-18 DIAGNOSIS — R252 Cramp and spasm: Secondary | ICD-10-CM | POA: Diagnosis present

## 2023-06-18 DIAGNOSIS — M62838 Other muscle spasm: Secondary | ICD-10-CM | POA: Diagnosis not present

## 2023-06-18 DIAGNOSIS — R102 Pelvic and perineal pain: Secondary | ICD-10-CM | POA: Insufficient documentation

## 2023-06-18 NOTE — Patient Instructions (Signed)
Moisturizers They are used in the vagina to hydrate the mucous membrane that make up the vaginal canal. Designed to keep a more normal acid balance (ph) Once placed in the vagina, it will last between two to three days.  Use 2-3 times per week at bedtime  Ingredients to avoid is glycerin and fragrance, can increase chance of infection Should not be used just before sex due to causing irritation Most are gels administered either in a tampon-shaped applicator or as a vaginal suppository. They are non-hormonal.   Types of Moisturizers(internal use)  Vitamin E vaginal suppositories- Whole foods, Amazon Moist Again Julva- (Do no use if taking  Tamoxifen) amazon Yes moisturizer- amazon NeuEve Silk , NeuEve Silver for menopausal or over 65 (if have severe vaginal atrophy or cancer treatments use NeuEve Silk for  1 month than move to [employer])- [employer], [place].com Olive and Bee intimate cream- www.oliveandbee.com.au Mae vaginal moisturizer- Amazon Aloe Good Clean Love Hyaluronic acid Hyalofemme Reveree hyaluronic acid inserts   Creams to use externally on the Vulva area daily and place on at night Vulva Balm/ V-magic cream by medicine mama- amazon Julva-amazon Vital "V Wild Yam salve ( help moisturize and help with thinning vulvar area, does have Beeswax [employer] Pro-Meno Wild Yam Cream- [employer] Desert Harvest Gele Cleo by Zane Herald labial moisturizer (Amazon),  aloe Good Clean Love Enchanted Rose by intimate rose  Things to avoid in the vaginal area Do not use things to irritate the vulvar area No lotions just specialized creams for the vulva area- Neogyn, V-magic,  No soaps; can use Aveeno or Calendula cleanser, unscented Dove if needed. Must be gentle No deodorants No douches Good to sleep without underwear to let the vaginal area to air out No scrubbing: spread the lips to let warm water rinse over labias and pat dry   Sonia Smith, Sonia Smith [facility] [address], [address] [place], [place] [address] W: [phone] [email or web address]
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
## 2023-06-25 ENCOUNTER — Encounter: Payer: Medicaid Other | Attending: Obstetrics and Gynecology | Admitting: Physical Therapy

## 2023-06-25 ENCOUNTER — Encounter: Payer: Self-pay | Admitting: Physical Therapy

## 2023-06-25 DIAGNOSIS — R252 Cramp and spasm: Secondary | ICD-10-CM | POA: Insufficient documentation

## 2023-06-25 DIAGNOSIS — R102 Pelvic and perineal pain: Secondary | ICD-10-CM | POA: Diagnosis not present
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
## 2023-06-30 ENCOUNTER — Encounter: Payer: Self-pay | Admitting: Physical Therapy

## 2023-06-30 ENCOUNTER — Encounter: Payer: Medicaid Other | Admitting: Physical Therapy

## 2023-06-30 DIAGNOSIS — R252 Cramp and spasm: Secondary | ICD-10-CM

## 2023-06-30 DIAGNOSIS — F900 Attention-deficit hyperactivity disorder, predominantly inattentive type: Secondary | ICD-10-CM | POA: Diagnosis not present

## 2023-06-30 DIAGNOSIS — F411 Generalized anxiety disorder: Secondary | ICD-10-CM | POA: Diagnosis not present

## 2023-06-30 DIAGNOSIS — R102 Pelvic and perineal pain: Secondary | ICD-10-CM
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
## 2023-07-01 ENCOUNTER — Encounter: Payer: Self-pay | Admitting: Physical Therapy

## 2023-07-22 ENCOUNTER — Encounter: Payer: Medicaid Other | Attending: Family | Admitting: Dietician

## 2023-07-22 ENCOUNTER — Encounter: Payer: Self-pay | Admitting: Dietician

## 2023-07-22 VITALS — Ht 67.0 in | Wt 115.2 lb

## 2023-07-22 DIAGNOSIS — Z713 Dietary counseling and surveillance: Secondary | ICD-10-CM | POA: Diagnosis not present

## 2023-07-22 DIAGNOSIS — E440 Moderate protein-calorie malnutrition: Secondary | ICD-10-CM | POA: Insufficient documentation

## 2023-07-22 DIAGNOSIS — Z681 Body mass index (BMI) 19 or less, adult: Secondary | ICD-10-CM | POA: Diagnosis not present

## 2023-07-22 NOTE — Progress Notes (Signed)
Medical Nutrition Therapy  Appointment Start time:  1345  Appointment End time:  1500  Primary concerns today: Gaining weight  Referral diagnosis: E44.0 - Malnutrition of moderate degree, Z68.1 - BMI>18.5 Preferred learning style: No preference indicated Learning readiness: Ready   NUTRITION ASSESSMENT   Anthropometrics: Ht: 67" Wt: 115.2 lbs BMI: 18.04 kg/m2 Wt History: Lowest of 106 lbs. UBW: 125-130 lbs  Clinical Medical Hx: Underweight, Vit D deficiency, B12 deficiency, GERD, Alpha-Gal, HLD, Chronic constipation, ED (anorexia/bulimia) Medications: Gabapentin, Docusate Sodium, Trazadone, Labs: Potassium - 3.4, BUN - <5, Glucose - 124, Calcium - 8.8, Hgb - 15.8 Notable Signs/Symptoms: low energy, N/V/D, slight muscle wasting   Lifestyle & Dietary Hx Pt reports goal of weight gain, history of weight fluctuations at low BMI r/t GI distress. Pt reports history of anorexia/bulimia as a young adult, states it was related to weight gain after first pregnancy that caused significant rift in marriage, fear of weight gain. Pt reports then getting divorced, which lead to weight gain and then subsequent weight loss. Pt reports being diagnosed with alpha-gal, states they either vomit, have diarrhea, or get extreme fatigue, reports largest Alpha-Gal reaction is to pork, states steak doesn't really bother them but ground beef will. Pt reports starting to drink Ensures now to help gain weight, is able to consume dairy. Pt reports burning urination when drinking water, hx recurrent UTIs, states they do not experience it with Dr. Reino Kent, pt reports taking gabapentin to lessen the intensity of discomfort. Pt reports usually having salad mixed with veggies and a can of tuna for dinner,    Estimated daily fluid intake: 48 oz Supplements: Vit D, Folic acid, Potassium, B12 Sleep: Sleeps poorly, trouble falling asleep, will sleep deeply for only a few hours Stress / self-care: Stress a/w GI  symptoms Current average weekly physical activity: ADLs, walks dog  24-Hr Dietary Recall First Meal: Equate ONS  Snack: none Second Meal: none Snack: none Third Meal: Chicken, shrimp carbonara (caused diarrhea) Snack: none Beverages: Water   NUTRITION DIAGNOSIS  [place]-3.1 Underweight As related to GI distress.  As evidenced by BMI of 18.04 kg/m2, persistent vomiting/diarrhea, Alpha-gal, fear of eating, hx ED.   NUTRITION INTERVENTION  Nutrition education (E-1) on the following topics:  Educated patient on the role of increasing calories and protein on building muscle mass and healthy weight gain. Worked with patient to identify sources of protein and healthy fats, as well as how to increase calories when preparing food. Educated patient to eat 5-6 times a day to maximize protein intake. Encourage patient to incorporate physical activity, especially resistance training, as much as possible. Educated pt on finding sources of protein outside of beef/pork to aid in weight gain/lean mass rebuilding.  Handouts Provided Include  High Protein High Calorie Nutrition Therapy  Learning Style & Readiness for Change Teaching method utilized: Visual & Auditory  Demonstrated degree of understanding via: Teach Back  Barriers to learning/adherence to lifestyle change: GI upset, poor appetite  Goals Established by Pt Please start taking a Prenatal daily multivitamin without iron, choose pill form for now and avoid gummies. When shopping for your supplement, choose Equate PLUS+ instead of the regular shakes, these usually have ~350 calories per bottle. Try having roasted [place] or chicken deli meat slices wrapped around a cheese stick for a high protein snack. Continue to add soft cooked chick peas or beans to your salads. Have eggs as often as you would like to!!  Choose creamy peanut butter as a snack as  often as possible and have an extra spoonful when you eat it! Pair it with banana or apples 1-2  times per day! Try to eat something with protein every 3 hours. For the time being avoid these foods and see if your GI symptoms improve: Corn, whole nuts, seeds, strawberries, blackberries, raspberries, kiwi, raw tomatoes, etc. Begin to journal your food choices every day. Write down each food item you eat and whether or not you had symptoms that day.   MONITORING & EVALUATION Dietary intake, weekly physical activity, weight, and GI symptoms in 2-3 weeks.  Next Steps  Patient is to journal foods and symptoms, follow up with RD.

## 2023-07-22 NOTE — Patient Instructions (Addendum)
Please start taking a Prenatal daily multivitamin without iron, choose pill form for now and avoid gummies.  When shopping for your supplement, choose Equate PLUS+ instead of the regular shakes, these usually have ~350 calories per bottle.  Try having roasted [place] or chicken deli meat slices wrapped around a cheese stick for a high protein snack.  Continue to add soft cooked chick peas or beans to your salads.  Have eggs as often as you would like to!!   Choose creamy peanut butter as a snack as often as possible and have an extra spoonful when you eat it! Pair it with banana or apples 1-2 times per day!  Try to eat something with protein every 3 hours.  For the time being avoid these foods and see if your GI symptoms improve: Corn, whole nuts, seeds, strawberries, blackberries, raspberries, kiwi, raw tomatoes, etc.  Begin to journal your food choices every day. Write down each food item you eat and whether or not you had symptoms that day.

## 2023-08-17 ENCOUNTER — Ambulatory Visit: Payer: Medicaid Other | Admitting: Dietician

## 2023-08-19 ENCOUNTER — Telehealth: Payer: Self-pay

## 2023-08-19 NOTE — Telephone Encounter (Signed)
 I did an outgoing call to schedule patient 25m follow appointment from the recall we had placed back in October. Per patient states she quite physical therapy and does not wish to schedule a follow up per patient " there is nothing she needs to follow up on "

## 2023-08-26 ENCOUNTER — Ambulatory Visit: Payer: Medicaid Other | Admitting: Cardiology

## 2023-09-04 ENCOUNTER — Ambulatory Visit: Payer: Medicaid Other | Attending: Cardiology | Admitting: Cardiology

## 2023-09-04 ENCOUNTER — Encounter: Payer: Self-pay | Admitting: Cardiology

## 2023-09-04 ENCOUNTER — Other Ambulatory Visit: Payer: Self-pay

## 2023-09-04 VITALS — BP 104/80 | HR 82 | Ht 67.0 in | Wt 118.4 lb

## 2023-09-04 DIAGNOSIS — R002 Palpitations: Secondary | ICD-10-CM

## 2023-09-04 DIAGNOSIS — I4819 Other persistent atrial fibrillation: Secondary | ICD-10-CM | POA: Diagnosis not present

## 2023-09-04 DIAGNOSIS — R55 Syncope and collapse: Secondary | ICD-10-CM

## 2023-09-04 NOTE — Patient Instructions (Signed)
Medication Instructions:  The current medical regimen is effective;  continue present plan and medications.  *If you need a refill on your cardiac medications before your next appointment, please call your pharmacy*  Follow-Up: At [facility], you and your health needs are our priority.  As part of our continuing mission to provide you with exceptional heart care, we have created designated Provider Care Teams.  These Care Teams include your primary Cardiologist (physician) and Advanced Practice Providers (APPs -  Physician Assistants and Nurse Practitioners) who all work together to provide you with the care you need, when you need it.  We recommend signing up for the patient portal called "MyChart".  Sign up information is provided on this After Visit Summary.  MyChart is used to connect with patients for Virtual Visits (Telemedicine).  Patients are able to view lab/test results, encounter notes, upcoming appointments, etc.  Non-urgent messages can be sent to your provider as well.   To learn more about what you can do with MyChart, go to [email or web address].    Your next appointment:   As needed  Provider:   Dr.Lambert

## 2023-09-04 NOTE — Progress Notes (Signed)
Electrophysiology Clinic Note    Date:  09/04/2023  Patient ID:  Sonia Smith, Sonia Smith 10-Nov-1980, MRN [id number] PCP:  Mort Sawyers, FNP  Cardiologist:  None Electrophysiologist: Lanier Prude, MD   Discussed the use of AI scribe software for clinical note transcription with the patient, who gave verbal consent to proceed.   Patient Profile    Chief Complaint: "shocky" sensation in chest  History of Present Illness: Sonia Smith is a 43 y.o. female with PMH notable for vasovagal syncope, anxiety, depression, alpha-gal, ADHD; seen today for Lanier Prude, MD for acute visit due to palpitations.    She last saw Dr. Lalla Smith 11/2021 for eval of her vasovagal syncope who recommended aerobic activity, compression, and hydration.   She presents for follow-up today, she is concerned about her chest sensations -  described as a sudden, brief, and typically occurs about once a week, often in the evening. The patient reports that the frequency of these episodes can increase with stress or when her weight "bottoms out" due to dietary restrictions related to alpha-gal. The patient also experiences episodes of rapid heart rate, which can reach up to 150 beats per minute. These episodes are often associated with minor physical activity like walking to the bathroom, also occur during periods of increased stress, and can be so severe that they cause the patient to feel the rapid heartbeat throughout her body and can lead to exhaustion. The patient was previously on Concerta for ADHD, but this was stopped due to a perceived worsening of the "shocky" sensation. The patient also reports significant weight loss and dietary restrictions due to alpha-gal, though has been working with a nutritionist and weight is increasing.     Device Information: None   AAD History: None     ROS:  Please see the history of present illness. All other systems are reviewed and otherwise negative.     Physical Exam    VS:  BP 104/80 (BP Location: Left Arm, Patient Position: Sitting, Cuff Size: Normal)   Pulse 82   Ht 5\' 7"  (1.702 m)   Wt 118 lb 6 oz (53.7 kg)   SpO2 96%   BMI 18.54 kg/m  BMI: Body mass index is 18.54 kg/m.  Wt Readings from Last 3 Encounters:  09/04/23 118 lb 6 oz (53.7 kg)  07/22/23 115 lb 3.2 oz (52.3 kg)  06/15/23 114 lb (51.7 kg)     GEN- The patient is well appearing, alert and oriented x 3 today.   Lungs- Clear to ausculation bilaterally, normal work of breathing.  Heart- Regular rate and rhythm, no murmurs, rubs or gallops Extremities- No peripheral edema, warm, dry    Studies Reviewed   Previous EP, cardiology notes.    EKG is ordered. Personal review of EKG from today shows:  SR at 82, sinus arrhythmia present LAD         Long term monitor, 07/16/2023 1 nonsustained SVT lasting 8 beats. Rare supraventricular and ventricular ectopy. No sustained arrhythmias. No atrial fibrillation.    Assessment and Plan    #) Palpitations Patient reports intermittent "shocky" sensations and episodes of tachycardia, particularly during periods of stress or physical activity. Previous EKG and monitor showed sinus arrhythmia and possible PACs or PVCs, but not at a frequency that warrants intervention. Patient has stopped Concerta due to exacerbation of symptoms. -Continue off Concerta. - Discussed repeat zio monitor, though patient deferred at this time since she thinks her symptoms are improving.  -  Encourage stress management techniques. -Advise patient to avoid caffeine. -Recommend slow transitions from sitting to standing. -Consider use of compression garments to improve circulation.  #) vasovagal syncope No further syncopal episodes in > 1 year      Current medicines are reviewed at length with the patient today.   The patient does not have concerns regarding her medicines.  The following changes were made today:  none  Labs/ tests  ordered today include:  Orders Placed This Encounter  Procedures   EKG 12-Lead     Disposition: Follow up with Dr. Lalla Smith or EP APP  PRN    I spent 25 minutes in the care of this patient today reviewing of her zio monitor, ER visits, lab work, and previous cardiology note, of which 15 minutes was face-to face.   Signed, Sherie Don, NP  09/04/23  4:09 PM  Electrophysiology [facility]

## 2023-09-22 DIAGNOSIS — N3010 Interstitial cystitis (chronic) without hematuria: Secondary | ICD-10-CM | POA: Diagnosis not present

## 2023-09-22 DIAGNOSIS — R102 Pelvic and perineal pain: Secondary | ICD-10-CM | POA: Diagnosis not present

## 2023-09-22 DIAGNOSIS — N94819 Vulvodynia, unspecified: Secondary | ICD-10-CM | POA: Diagnosis not present

## 2023-09-22 DIAGNOSIS — G588 Other specified mononeuropathies: Secondary | ICD-10-CM | POA: Diagnosis not present

## 2023-09-24 ENCOUNTER — Encounter: Payer: Self-pay | Admitting: Physical Therapy

## 2023-09-24 ENCOUNTER — Ambulatory Visit: Payer: Self-pay | Admitting: Family

## 2023-09-24 ENCOUNTER — Telehealth: Payer: Self-pay | Admitting: Family

## 2023-09-24 NOTE — Telephone Encounter (Signed)
 Chief Complaint: Rectal pain/bleeding Symptoms: Rectal pain/bleeding Frequency: today Pertinent Negatives: Patient denies diarrhea, constipation Disposition: [] ED /[] Urgent Care (no appt availability in office) / [x] Appointment(In office/virtual)/ []  [facility]/ [] Home Care/ [] Refused Recommended Disposition /[] [facility]/ []  Follow-up with PCP Additional Notes: Patient called in stating she is concerned due to episode this morning while she was having a bowel movement, she felt a popping sensation and there was a lot of bright red blood in the toilet. Patient states she continued to have blood for a few hours that was slowly decreasing. She is no longer having blood but wants to be evaluated in office because she has a history of hemorrhoidectomy and polyps after her son's birth, and believes she needs another colonoscopy. Advised patient to call back if symptoms worsen before her appt on Monday.    Reason for Disposition  MILD rectal bleeding (more than just a few drops or streaks)  Answer Assessment - Initial Assessment Questions 1. APPEARANCE of BLOOD: "What color is it?" "Is it passed separately, on the surface of the stool, or mixed in with the stool?"      Bright red 2. AMOUNT: "How much blood was passed?"      A lot at time of episode  3. FREQUENCY: "How many times has blood been passed with the stools?"      1 4. ONSET: "When was the blood first seen in the stools?" (Days or weeks)      This morning 5. DIARRHEA: "Is there also some diarrhea?" If Yes, ask: "How many diarrhea stools in the past 24 hours?"      No 6. CONSTIPATION: "Do you have constipation?" If Yes, ask: "How bad is it?"     No 7. RECURRENT SYMPTOMS: "Have you had blood in your stools before?" If Yes, ask: "When was the last time?" and "What happened that time?"      Yes have had hemorrhoids and polyps  8. BLOOD THINNERS: "Do you take any blood thinners?" (e.g., Coumadin/warfarin,  Pradaxa/dabigatran, aspirin)     No  9. OTHER SYMPTOMS: "Do you have any other symptoms?"  (e.g., abdomen pain, vomiting, dizziness, fever)     Anytime I feel I need to go to bathroom, on left side of stomach I have excruciating pain  Protocols used: Rectal Bleeding-A-AH

## 2023-09-24 NOTE — Telephone Encounter (Signed)
 Pt made appt on MyChart I called the patient to see if we could see her sooner and I triage pt . Pt is bleeding from anal and stated  she's in pain

## 2023-09-25 NOTE — Telephone Encounter (Signed)
 See other note from triage nurse.

## 2023-09-28 ENCOUNTER — Encounter: Payer: Self-pay | Admitting: Family

## 2023-09-28 ENCOUNTER — Ambulatory Visit: Payer: Medicaid Other | Admitting: Family

## 2023-09-28 ENCOUNTER — Ambulatory Visit
Admission: RE | Admit: 2023-09-28 | Discharge: 2023-09-28 | Disposition: A | Discharge status: Home / Self Care | Payer: Medicaid Other | Source: Ambulatory Visit | Attending: Family | Admitting: Family

## 2023-09-28 VITALS — BP 100/68 | HR 88 | Temp 98.2°F | Ht 67.0 in | Wt 122.0 lb

## 2023-09-28 DIAGNOSIS — J0110 Acute frontal sinusitis, unspecified: Secondary | ICD-10-CM | POA: Diagnosis not present

## 2023-09-28 DIAGNOSIS — Z860100 Personal history of colon polyps, unspecified: Secondary | ICD-10-CM | POA: Insufficient documentation

## 2023-09-28 DIAGNOSIS — R1032 Left lower quadrant pain: Secondary | ICD-10-CM | POA: Insufficient documentation

## 2023-09-28 DIAGNOSIS — K5904 Chronic idiopathic constipation: Secondary | ICD-10-CM | POA: Insufficient documentation

## 2023-09-28 DIAGNOSIS — Z8379 Family history of other diseases of the digestive system: Secondary | ICD-10-CM | POA: Insufficient documentation

## 2023-09-28 DIAGNOSIS — L750 Bromhidrosis: Secondary | ICD-10-CM | POA: Diagnosis not present

## 2023-09-28 DIAGNOSIS — R195 Other fecal abnormalities: Secondary | ICD-10-CM | POA: Insufficient documentation

## 2023-09-28 DIAGNOSIS — K645 Perianal venous thrombosis: Secondary | ICD-10-CM

## 2023-09-28 LAB — COMPREHENSIVE METABOLIC PANEL
ALT: 10 U/L (ref 0–35)
AST: 11 U/L (ref 0–37)
Albumin: 4.1 g/dL (ref 3.5–5.2)
Alkaline Phosphatase: 50 U/L (ref 39–117)
BUN: 5 mg/dL — ABNORMAL LOW (ref 6–23)
CO2: 32 meq/L (ref 19–32)
Calcium: 8.8 mg/dL (ref 8.4–10.5)
Chloride: 104 meq/L (ref 96–112)
Creatinine, Ser: 0.82 mg/dL (ref 0.40–1.20)
GFR: 88.1 mL/min (ref >60.00)
Glucose, Bld: 55 mg/dL — ABNORMAL LOW (ref 70–99)
Potassium: 4 meq/L (ref 3.5–5.1)
Sodium: 142 meq/L (ref 135–145)
Total Bilirubin: 0.4 mg/dL (ref 0.2–1.2)
Total Protein: 6.4 g/dL (ref 6.0–8.3)

## 2023-09-28 LAB — POCT URINE DIPSTICK
Bilirubin, UA: NEGATIVE
Glucose, UA: NEGATIVE mg/dL
Ketones, POC UA: NEGATIVE mg/dL
Leukocytes, UA: NEGATIVE
Nitrite, UA: NEGATIVE
Spec Grav, UA: 1.01 (ref 1.010–1.025)
Urobilinogen, UA: 0.2 U/dL
pH, UA: 6 (ref 5.0–8.0)

## 2023-09-28 LAB — CBC WITH DIFFERENTIAL/PLATELET
Basophils Absolute: 0.1 10*3/uL (ref 0.0–0.1)
Basophils Relative: 0.9 % (ref 0.0–3.0)
Eosinophils Absolute: 0.1 10*3/uL (ref 0.0–0.7)
Eosinophils Relative: 1.3 % (ref 0.0–5.0)
HCT: 42.9 % (ref 36.0–46.0)
Hemoglobin: 14.2 g/dL (ref 12.0–15.0)
Lymphocytes Relative: 22.9 % (ref 12.0–46.0)
Lymphs Abs: 2.1 10*3/uL (ref 0.7–4.0)
MCHC: 33 g/dL (ref 30.0–36.0)
MCV: 98.4 fl (ref 78.0–100.0)
Monocytes Absolute: 0.4 10*3/uL (ref 0.1–1.0)
Monocytes Relative: 4.3 % (ref 3.0–12.0)
Neutro Abs: 6.5 10*3/uL (ref 1.4–7.7)
Neutrophils Relative %: 70.6 % (ref 43.0–77.0)
Platelets: 210 10*3/uL (ref 150.0–400.0)
RBC: 4.36 Mil/uL (ref 3.87–5.11)
RDW: 12.4 % (ref 11.5–15.5)
WBC: 9.3 10*3/uL (ref 4.0–10.5)

## 2023-09-28 LAB — LAB REPORT - SCANNED: EGFR: 88.1

## 2023-09-28 MED ORDER — IOHEXOL 300 MG/ML  SOLN
50.0000 mL | Freq: Once | INTRAMUSCULAR | Status: AC | PRN
  Administered 2023-09-28: 50 mL via ORAL

## 2023-09-28 MED ORDER — HYDROCORTISONE ACETATE 25 MG RE SUPP: 25.0000 mg | Freq: Two times a day (BID) | RECTAL | 0 refills | Status: DC

## 2023-09-28 MED ORDER — AMOXICILLIN-POT CLAVULANATE 875-125 MG PO TABS: 1.0000 | ORAL_TABLET | Freq: Two times a day (BID) | ORAL | 0 refills | Status: DC

## 2023-09-28 MED ORDER — LINACLOTIDE 145 MCG PO CAPS: 145.0000 ug | ORAL_CAPSULE | Freq: Every day | ORAL | 2 refills | Status: DC

## 2023-09-28 MED ORDER — IOHEXOL 300 MG/ML  SOLN
80.0000 mL | Freq: Once | INTRAMUSCULAR | Status: AC | PRN
  Administered 2023-09-28: 80 mL via INTRAVENOUS

## 2023-09-28 NOTE — Assessment & Plan Note (Signed)
 Urgent referral to gi  Rx anusol suppository  Sitz bath prn

## 2023-09-28 NOTE — Addendum Note (Signed)
 Addended by: Jaynee Eagles C on: 09/28/2023 12:24 PM   Modules accepted: Orders

## 2023-09-28 NOTE — Assessment & Plan Note (Signed)
 Stat ct abd pelvis with R/o diverticulitis, colon mass, and or constipation/obstruction R/o IBD  Cbc and cmp ordered pending results  Calproectin ordered, daughter with UC

## 2023-09-28 NOTE — Assessment & Plan Note (Signed)
 Fobt and call protectin ordered Referral placed for gi for eval/treat R/o IBD

## 2023-09-28 NOTE — Assessment & Plan Note (Signed)
 Prescription given for augmentin 875/125 mg po bid for ten days. Pt to continue tylenol/ibuprofen prn sinus pain. Continue with humidifier prn and steam showers recommended as well. instructed If no symptom improvement in 48 hours please f/u

## 2023-09-28 NOTE — Progress Notes (Signed)
 Established Patient Office Visit  Subjective:   Patient ID: Sonia Smith, female    DOB: [birth year]  Age: 43 y.o. MRN: [id number]  CC:  Chief Complaint  Patient presents with   Rectal Bleeding    HPI: Sonia Smith is a 43 y.o. female presenting on 09/28/2023 for Rectal Bleeding  Five days ago went to the bathroom and felt something pop' and looked down between her legs and there was 'so much blood' . The blood was bright red and it bled for a awhile, started around 830 and continued to bleed pretty heavily for about 6 hours. She noticed one hemorrhoid that has been there forever and she saw it was swollen and had torn right open. Very painful at the time. Was sore friday but no more pain on Saturday. If she goes to the bathroom slight smear of blood but otherwise much better since five days ago. She has had hemorrhoidectomy in the past. She does chronically have constipation, only has a bowel movement about once a week. She has a good bowel movement. She takes stool softeners daily. She has had colonoscopy in the past, did have polyps per her recollection. She would like referral to GI to evaluate again, because she does notice her stool is very thin, and about the size of her finger with lines in there 'like drag marks'. She does report often with abdominal pain. The only time she really knows she has to have a bowel movement is that she has llq abdominal pain which can be pretty bad but otherwise no real feeling of having to have a bowel movement.   She also started with some cold symptoms five days ago. Had started with headache and has progressed to sinus pressure and nasal congestion, hoarseness, pnd. She has had a cough. She states the symptoms are worsening slightly.        ROS: Negative unless specifically indicated above in HPI.   Relevant past medical history reviewed and updated as indicated.   Allergies and medications reviewed and updated.   Current  Outpatient Medications:    acetaminophen-codeine (TYLENOL #4) 300-60 MG tablet, Take 1 tablet by mouth every 4 (four) hours as needed., Disp: , Rfl:    acyclovir (ZOVIRAX) 800 MG tablet, TAKE 1 TABLET BY MOUTH EVERY MORNING, Disp: 90 tablet, Rfl: 2   amoxicillin-clavulanate (AUGMENTIN) 875-125 MG tablet, Take 1 tablet by mouth 2 (two) times daily., Disp: 20 tablet, Rfl: 0   cholecalciferol (VITAMIN D3) 25 MCG (1000 UNIT) tablet, Take 1,000 Units by mouth once a week., Disp: , Rfl:    clindamycin (CLEOCIN T) 1 % external solution, Apply topically daily., Disp: 30 mL, Rfl: 3   docusate sodium (COLACE) 100 MG capsule, Take 100 mg by mouth 2 (two) times daily., Disp: , Rfl:    ESTRADIOL PO, Take by mouth. Twice weekly, Disp: , Rfl:    folic acid (FOLVITE) 400 MCG tablet, Take 400 mcg by mouth 2 (two) times a week., Disp: , Rfl:    gabapentin (NEURONTIN) 800 MG tablet, Take 1,200 mg by mouth 2 (two) times daily., Disp: , Rfl:    hydrocortisone (ANUSOL-HC) 25 MG suppository, Place 1 suppository (25 mg total) rectally 2 (two) times daily., Disp: 12 suppository, Rfl: 0   traZODone (DESYREL) 25 mg TABS tablet, Take 25 mg by mouth at bedtime., Disp: , Rfl:    tretinoin (RETIN-A) 0.025 % cream, Apply topically at bedtime., Disp: 45 g, Rfl: 1   triamcinolone cream (KENALOG)  0.1 %, Apply 1 Application topically 2 (two) times daily., Disp: 30 g, Rfl: 0  Allergies  Allergen Reactions   Beef (Bovine) Protein Other (See Comments) and Nausea And Vomiting    diarrhea   Other Other (See Comments) and Nausea And Vomiting    diarrhea   Pork-Derived Products Other (See Comments) and Nausea And Vomiting    diarrhea   Adhesive [Tape] Rash    From bandaides. Tape is OK.   Alpha-Gal Other (See Comments), Dermatitis, Diarrhea, Hives, Itching, Nausea And Vomiting, Palpitations and Rash   Wound Dressing Adhesive Rash    From bandaides. Tape is OK.    Objective:   BP 100/68 (BP Location: Right Arm, Patient  Position: Sitting, Cuff Size: Normal)   Pulse 88   Temp 98.2 F (36.8 C) (Temporal)   Ht 5\' 7"  (1.702 m)   Wt 122 lb (55.3 kg)   SpO2 98%   BMI 19.11 kg/m    Physical Exam Abdominal:     General: Bowel sounds are normal.     Tenderness: There is abdominal tenderness in the suprapubic area and left lower quadrant.     Comments: Mass palpated left to midline lower abdominal pain  Genitourinary:    Rectum: Tenderness and external hemorrhoid (two thrombosed, no blue discoloration tender on palpation slight, one non thrombosed ext hemorrhoid) present.     Assessment & Plan:  External hemorrhoid, thrombosed Assessment & Plan: Urgent referral to gi  Rx anusol suppository  Sitz bath prn   Orders: -     Hydrocortisone Acetate; Place 1 suppository (25 mg total) rectally 2 (two) times daily.  Dispense: 12 suppository; Refill: 0 -     Ambulatory referral to Gastroenterology  Chronic idiopathic constipation  History of colon polyps  Abnormal stools Assessment & Plan: Fobt and call protectin ordered Referral placed for gi for eval/treat R/o IBD    Family history of ulcerative colitis -     CALPROTECTIN; Future  Left lower quadrant abdominal pain Assessment & Plan: Stat ct abd pelvis with R/o diverticulitis, colon mass, and or constipation/obstruction R/o IBD  Cbc and cmp ordered pending results  Calproectin ordered, daughter with UC  Orders: -     Fecal occult blood, imunochemical; Future -     CBC with Differential/Platelet -     Comprehensive metabolic panel -     CT ABDOMEN PELVIS W CONTRAST; Future -     CALPROTECTIN; Future  Urinary body odor -     Urine Culture  Acute non-recurrent frontal sinusitis Assessment & Plan: Prescription given for augmentin 875/125 mg po bid for ten days. Pt to continue tylenol/ibuprofen prn sinus pain. Continue with humidifier prn and steam showers recommended as well. instructed If no symptom improvement in 48 hours please  f/u   Orders: -     Amoxicillin-Pot Clavulanate; Take 1 tablet by mouth 2 (two) times daily.  Dispense: 20 tablet; Refill: 0     Follow up plan: Return if symptoms worsen or fail to improve.  Mort Sawyers, FNP

## 2023-09-29 ENCOUNTER — Other Ambulatory Visit: Payer: Self-pay | Admitting: Family

## 2023-09-29 ENCOUNTER — Ambulatory Visit: Payer: Medicaid Other | Admitting: Family

## 2023-09-29 ENCOUNTER — Encounter: Payer: Self-pay | Admitting: Family

## 2023-09-29 DIAGNOSIS — K5909 Other constipation: Secondary | ICD-10-CM

## 2023-09-29 LAB — URINE CULTURE
MICRO NUMBER:: 16119946
Result:: NO GROWTH
SPECIMEN QUALITY:: ADEQUATE

## 2023-09-29 MED ORDER — LINACLOTIDE 145 MCG PO CAPS: 145.0000 ug | ORAL_CAPSULE | Freq: Every day | ORAL | 0 refills | Status: DC

## 2023-10-02 ENCOUNTER — Other Ambulatory Visit (INDEPENDENT_AMBULATORY_CARE_PROVIDER_SITE_OTHER): Payer: Self-pay

## 2023-10-02 DIAGNOSIS — Z8379 Family history of other diseases of the digestive system: Secondary | ICD-10-CM | POA: Diagnosis not present

## 2023-10-02 DIAGNOSIS — R1032 Left lower quadrant pain: Secondary | ICD-10-CM | POA: Diagnosis not present

## 2023-10-05 ENCOUNTER — Encounter: Payer: Self-pay | Admitting: Family

## 2023-10-05 LAB — FECAL OCCULT BLOOD, IMMUNOCHEMICAL: Fecal Occult Bld: NEGATIVE

## 2023-10-07 ENCOUNTER — Other Ambulatory Visit: Payer: Self-pay | Admitting: Family

## 2023-10-07 DIAGNOSIS — K645 Perianal venous thrombosis: Secondary | ICD-10-CM

## 2023-10-08 ENCOUNTER — Encounter: Payer: Self-pay | Admitting: Surgery

## 2023-10-08 ENCOUNTER — Ambulatory Visit: Admitting: Surgery

## 2023-10-08 VITALS — BP 110/73 | HR 80 | Temp 98.2°F | Ht 67.0 in | Wt 118.8 lb

## 2023-10-08 DIAGNOSIS — K625 Hemorrhage of anus and rectum: Secondary | ICD-10-CM

## 2023-10-08 DIAGNOSIS — Z9889 Other specified postprocedural states: Secondary | ICD-10-CM | POA: Diagnosis not present

## 2023-10-08 LAB — CALPROTECTIN: Calprotectin: 15 ug/g

## 2023-10-08 NOTE — Patient Instructions (Addendum)
 Follow-up with our office as needed.  Please call and ask to speak with a nurse if you develop questions or concerns.   Advised to pursue a goal of 25 to 30 g of fiber daily.  The majority of this may be through natural sources, advised to ensure a minimal daily fiber supplementation.  Various forms of supplements discussed.  Recommend Psyllium husk, with options of mixing with beverage or applesauce to make more tolerable. Strongly advised to consume more fluids(especially in proximity to fiber intake) and to ensure adequate hydration.   Watch color of urine to determine adequacy of hydration.  Clarity is pursued in urine output, and bowel activity that responds and corresponds to significant meal intake.   We need to avoid deferring having bowel movements, advised to take the time at the first sign of sensation, typically following meals, and in the morning.  The need to avoid more frequently, and the presence of flatus may indicate the need for bowel movement.  Do not defer for later.  Addition of MiraLAX (or its generic equivalent) may be needed ensure at least twice daily bowel movements.  If multiple doses of MiraLAX are necessary utilize them. Never skip a day... Do not tolerate a day without a bowel movement unless you are fasting.  To be regular, we must do the above EVERY day.   Soluble Fiber Dissolves in Water: Soluble fiber dissolves in water to form a gel-like substance. Slows Digestion: This type of fiber slows down digestion, which can help control blood sugar levels and lower cholesterol. Sources: Common sources include oats, beans, apples, citrus fruits, and psyllium. Benefits: Helps manage cholesterol levels. Aids in blood sugar control. Increases healthy gut bacteria, which can lower inflammation and improve digestion.  Insoluble Fiber Does Not Dissolve in Water: Insoluble fiber does not dissolve in water and remains mostly intact as it passes through the digestive  system. Adds Bulk to Stool: It adds bulk to stool, which helps promote regular bowel movements and prevent constipation. Sources: Common sources include whole grains, nuts, beans, and vegetables like cauliflower and potatoes. Benefits: Improves bowel health and regularity. Reduces the risk of colorectal conditions like hemorrhoids and diverticulitis. Supports insulin sensitivity in people with diabetes.  Both types of fiber are essential for overall health, and it's beneficial to include a 50/50 mix of both in your diet.

## 2023-10-09 ENCOUNTER — Encounter: Payer: Self-pay | Admitting: Family

## 2023-10-09 DIAGNOSIS — Z9889 Other specified postprocedural states: Secondary | ICD-10-CM | POA: Insufficient documentation
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
## 2023-10-12 ENCOUNTER — Other Ambulatory Visit: Payer: Self-pay

## 2023-10-12 ENCOUNTER — Encounter: Payer: Self-pay | Admitting: Gastroenterology

## 2023-10-12 ENCOUNTER — Ambulatory Visit: Admitting: Gastroenterology

## 2023-10-12 VITALS — BP 109/72 | HR 65 | Temp 98.3°F | Ht 67.0 in | Wt 121.0 lb

## 2023-10-12 DIAGNOSIS — Z860100 Personal history of colon polyps, unspecified: Secondary | ICD-10-CM

## 2023-10-12 DIAGNOSIS — K581 Irritable bowel syndrome with constipation: Secondary | ICD-10-CM

## 2023-10-12 MED ORDER — NA SULFATE-K SULFATE-MG SULF 17.5-3.13-1.6 GM/177ML PO SOLN: 354.0000 mL | Freq: Once | ORAL | 0 refills | Status: AC
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
## 2023-10-19 ENCOUNTER — Encounter: Payer: Self-pay | Admitting: Family

## 2023-10-20 ENCOUNTER — Ambulatory Visit: Payer: Self-pay | Admitting: Anesthesiology

## 2023-10-20 ENCOUNTER — Encounter: Payer: Self-pay | Admitting: Gastroenterology

## 2023-10-20 ENCOUNTER — Encounter
Admission: RE | Disposition: A | Discharge status: Home / Self Care | Payer: Self-pay | Source: Home / Self Care | Attending: Gastroenterology

## 2023-10-20 ENCOUNTER — Other Ambulatory Visit: Payer: Self-pay

## 2023-10-20 ENCOUNTER — Ambulatory Visit
Admission: RE | Admit: 2023-10-20 | Discharge: 2023-10-20 | Disposition: A | Discharge status: Home / Self Care | Attending: Gastroenterology | Admitting: Gastroenterology

## 2023-10-20 DIAGNOSIS — F419 Anxiety disorder, unspecified: Secondary | ICD-10-CM | POA: Insufficient documentation

## 2023-10-20 DIAGNOSIS — J45909 Unspecified asthma, uncomplicated: Secondary | ICD-10-CM | POA: Insufficient documentation

## 2023-10-20 DIAGNOSIS — F17210 Nicotine dependence, cigarettes, uncomplicated: Secondary | ICD-10-CM | POA: Diagnosis not present

## 2023-10-20 DIAGNOSIS — Z860100 Personal history of colon polyps, unspecified: Secondary | ICD-10-CM | POA: Diagnosis not present

## 2023-10-20 DIAGNOSIS — Z5941 Food insecurity: Secondary | ICD-10-CM | POA: Insufficient documentation

## 2023-10-20 DIAGNOSIS — F32A Depression, unspecified: Secondary | ICD-10-CM | POA: Diagnosis not present

## 2023-10-20 DIAGNOSIS — Z1211 Encounter for screening for malignant neoplasm of colon: Secondary | ICD-10-CM | POA: Insufficient documentation

## 2023-10-20 DIAGNOSIS — Z5986 Financial insecurity: Secondary | ICD-10-CM | POA: Diagnosis not present

## 2023-10-20 DIAGNOSIS — E782 Mixed hyperlipidemia: Secondary | ICD-10-CM | POA: Diagnosis not present

## 2023-10-20 HISTORY — DX: Attention-deficit hyperactivity disorder, unspecified type: F90.9

## 2023-10-20 HISTORY — PX: COLONOSCOPY: SHX5424

## 2023-10-20 SURGERY — COLONOSCOPY
Anesthesia: General | Site: Rectum

## 2023-10-20 MED ORDER — STERILE WATER FOR IRRIGATION IR SOLN
Status: DC | PRN
  Administered 2023-10-20: 1

## 2023-10-20 MED ORDER — LIDOCAINE HCL (PF) 2 % IJ SOLN
INTRAMUSCULAR | Status: AC
  Filled 2023-10-20: qty 5

## 2023-10-20 MED ORDER — STERILE WATER FOR IRRIGATION IR SOLN
Status: DC | PRN
  Administered 2023-10-20: 60 mL

## 2023-10-20 MED ORDER — PROPOFOL 10 MG/ML IV BOLUS
INTRAVENOUS | Status: DC | PRN
  Administered 2023-10-20: 50 mg via INTRAVENOUS
  Administered 2023-10-20: 100 mg via INTRAVENOUS
  Administered 2023-10-20 (×2): 40 mg via INTRAVENOUS
  Administered 2023-10-20 (×2): 30 mg via INTRAVENOUS
  Administered 2023-10-20: 50 mg via INTRAVENOUS

## 2023-10-20 MED ORDER — LACTATED RINGERS IV SOLN: INTRAVENOUS | Status: DC

## 2023-10-20 MED ORDER — SODIUM CHLORIDE 0.9 % IV SOLN
INTRAVENOUS | Status: DC
Start: 2023-10-20 — End: 2023-10-20

## 2023-10-20 SURGICAL SUPPLY — 16 items
CLIP HMST 235XBRD CATH ROT (MISCELLANEOUS) IMPLANT
ELECT REM PT RETURN 9FT ADLT (ELECTROSURGICAL) IMPLANT
ELECTRODE REM PT RTRN 9FT ADLT (ELECTROSURGICAL) IMPLANT
FORCEPS BIOP RAD 4 LRG CAP 4 (CUTTING FORCEPS) IMPLANT
GOWN CVR UNV OPN BCK APRN NK (MISCELLANEOUS) ×2 IMPLANT
INJECTOR VARIJECT VIN23 (MISCELLANEOUS) IMPLANT
KIT DEFENDO VALVE AND CONN (KITS) IMPLANT
KIT PRC NS LF DISP ENDO (KITS) ×1 IMPLANT
MANIFOLD NEPTUNE II (INSTRUMENTS) ×1 IMPLANT
MARKER SPOT ENDO TATTOO 5ML (MISCELLANEOUS) IMPLANT
PROBE APC STR FIRE (PROBE) IMPLANT
RETRIEVER NET ROTH 2.5X230 LF (MISCELLANEOUS) IMPLANT
SNARE COLD EXACTO (MISCELLANEOUS) IMPLANT
TRAP ETRAP POLY (MISCELLANEOUS) IMPLANT
VARIJECT INJECTOR VIN23 (MISCELLANEOUS) IMPLANT
WATER STERILE IRR 250ML POUR (IV SOLUTION) ×1 IMPLANT

## 2023-10-20 NOTE — Anesthesia Postprocedure Evaluation (Signed)
 Anesthesia Post Note  Patient: Sonia Smith  Procedure(s) Performed: COLONOSCOPY (Rectum)  Patient location during evaluation: PACU Anesthesia Type: General Level of consciousness: awake and alert Pain management: pain level controlled Vital Signs Assessment: post-procedure vital signs reviewed and stable Respiratory status: spontaneous breathing, nonlabored ventilation, respiratory function stable and patient connected to nasal cannula oxygen Cardiovascular status: blood pressure returned to baseline and stable Postop Assessment: no apparent nausea or vomiting Anesthetic complications: no   No notable events documented.   Last Vitals:  Vitals:   10/20/23 1045 10/20/23 1055  BP: 98/65 93/64  Pulse: 78 (!) 32  Resp: 18 15  Temp:  36.6 C  SpO2: 99% 100%    Last Pain:  Vitals:   10/20/23 1055  TempSrc:   PainSc: 0-No pain                 Chabely Norby C Natoya Viscomi

## 2023-10-20 NOTE — Transfer of Care (Signed)
 Immediate Anesthesia Transfer of Care Note  Patient: Sonia Smith  Procedure(s) Performed: COLONOSCOPY (Rectum)  Patient Location: PACU  Anesthesia Type: General  Level of Consciousness: awake, alert  and patient cooperative  Airway and Oxygen Therapy: Patient Spontanous Breathing and Patient connected to supplemental oxygen  Post-op Assessment: Post-op Vital signs reviewed, Patient's Cardiovascular Status Stable, Respiratory Function Stable, Patent Airway and No signs of Nausea or vomiting  Post-op Vital Signs: Reviewed and stable  Complications: No notable events documented.

## 2023-10-20 NOTE — Anesthesia Preprocedure Evaluation (Signed)
 Anesthesia Evaluation  Patient identified by MRN, date of birth, ID band Patient awake    Reviewed: Allergy & Precautions, H&P , NPO status , Patient's Chart, lab work & pertinent test results  Airway Mallampati: II  TM Distance: >3 FB Neck ROM: Full    Dental no notable dental hx. (+) Implants Implant right upper lateral incisor:   Pulmonary neg pulmonary ROS, asthma , Current Smoker   Pulmonary exam normal breath sounds clear to auscultation       Cardiovascular negative cardio ROS Normal cardiovascular exam+ dysrhythmias  Rhythm:Regular Rate:Normal     Neuro/Psych  Headaches PSYCHIATRIC DISORDERS Anxiety Depression     Neuromuscular disease negative neurological ROS  negative psych ROS   GI/Hepatic negative GI ROS, Neg liver ROS,GERD  ,,  Endo/Other  negative endocrine ROS    Renal/GU negative Renal ROS  negative genitourinary   Musculoskeletal negative musculoskeletal ROS (+) Arthritis ,    Abdominal   Peds negative pediatric ROS (+)  Hematology negative hematology ROS (+) Blood dyscrasia, anemia   Anesthesia Other Findings   Blood in stool  Migraines GERD (gastroesophageal reflux disease) Vaginal Pap smear, abnormal Lupus Painful intercourse Childhood asthma  Depression Pudendal neuralgia  DVT (deep venous thrombosis) (HCC) Neuromuscular disorder (HCC) Dysrhythmia Dysplastic nevus  Acute non-recurrent sinusitis Eustachian tube dysfunction, left Cystic acne H/O: hysterectomy 11/2006  Anxiety dx'd 2019 Depression, recurrent (HCC) dx'd 2019   History of admission to inpatient psychiatry department 08/2017  PTSD (post-traumatic stress disorder) dx'd 2019   Eczema ADHD (attention deficit hyperactivity disorder)      Reproductive/Obstetrics negative OB ROS                              Anesthesia Physical Anesthesia Plan  ASA: 3  Anesthesia Plan: General   Post-op Pain  Management:    Induction: Intravenous  PONV Risk Score and Plan:   Airway Management Planned: Natural Airway and Nasal Cannula  Additional Equipment:   Intra-op Plan:   Post-operative Plan:   Informed Consent: I have reviewed the patients History and Physical, chart, labs and discussed the procedure including the risks, benefits and alternatives for the proposed anesthesia with the patient or authorized representative who has indicated his/her understanding and acceptance.     Dental Advisory Given  Plan Discussed with: Anesthesiologist, CRNA and Surgeon  Anesthesia Plan Comments: (Patient consented for risks of anesthesia including but not limited to:  - adverse reactions to medications - risk of airway placement if required - damage to eyes, teeth, lips or other oral mucosa - nerve damage due to positioning  - sore throat or hoarseness - Damage to heart, brain, nerves, lungs, other parts of body or loss of life  Patient voiced understanding and assent.)         Anesthesia Quick Evaluation

## 2023-10-20 NOTE — Op Note (Signed)
 [facility] Gastroenterology Patient Name: Sonia Smith Procedure Date: 10/20/2023 10:16 AM MRN: [id number] Account #: [id number] Date of Birth: 1981-06-13 Admit Type: Outpatient Age: 43 Room: [facility] OR ROOM 01 Gender: Female Note Status: Finalized Instrument Name: [id number] Procedure:             Colonoscopy Indications:           High risk colon cancer surveillance: Personal history                         of colonic polyps Providers:             Toney Reil MD, MD Referring MD:          Mort Sawyers (Referring MD) Medicines:             General Anesthesia Complications:         No immediate complications. Estimated blood loss: None. Procedure:             Pre-Anesthesia Assessment:                        - Prior to the procedure, a History and Physical was                         performed, and patient medications and allergies were                         reviewed. The patient is competent. The risks and                         benefits of the procedure and the sedation options and                         risks were discussed with the patient. All questions                         were answered and informed consent was obtained.                         Patient identification and proposed procedure were                         verified by the physician, the nurse, the                         anesthesiologist, the anesthetist and the technician                         in the pre-procedure area in the procedure room in the                         endoscopy suite. Mental Status Examination: alert and                         oriented. Airway Examination: normal oropharyngeal                         airway and neck mobility. Respiratory Examination:  clear to auscultation. CV Examination: normal.                         Prophylactic Antibiotics: The patient does not require                         prophylactic antibiotics. Prior  Anticoagulants: The                         patient has taken no anticoagulant or antiplatelet                         agents. ASA Grade Assessment: III - A patient with                         severe systemic disease. After reviewing the risks and                         benefits, the patient was deemed in satisfactory                         condition to undergo the procedure. The anesthesia                         plan was to use general anesthesia. Immediately prior                         to administration of medications, the patient was                         re-assessed for adequacy to receive sedatives. The                         heart rate, respiratory rate, oxygen saturations,                         blood pressure, adequacy of pulmonary ventilation, and                         response to care were monitored throughout the                         procedure. The physical status of the patient was                         re-assessed after the procedure.                        After obtaining informed consent, the colonoscope was                         passed under direct vision. Throughout the procedure,                         the patient's blood pressure, pulse, and oxygen                         saturations were monitored continuously. The  Colonoscope was introduced through the anus and                         advanced to the the cecum, identified by appendiceal                         orifice and ileocecal valve. The colonoscopy was                         performed without difficulty. The patient tolerated                         the procedure well. The quality of the bowel                         preparation was evaluated using the BBPS [facility] Bowel                         Preparation Scale) with scores of: Right Colon = 3,                         Transverse Colon = 3 and Left Colon = 3 (entire mucosa                         seen well with no  residual staining, small fragments                         of stool or opaque liquid). The total BBPS score                         equals 9. The ileocecal valve, appendiceal orifice,                         and rectum were photographed. Findings:      The perianal and digital rectal examinations were normal. Pertinent       negatives include normal sphincter tone and no palpable rectal lesions.      The entire examined colon appeared normal.      The retroflexed view of the distal rectum and anal verge was normal and       showed no anal or rectal abnormalities. Impression:            - The entire examined colon is normal.                        - The distal rectum and anal verge are normal on                         retroflexion view.                        - No specimens collected. Recommendation:        - Discharge patient to home (with escort).                        - Resume previous diet today.                        -  Continue present medications.                        - Repeat colonoscopy in 10 years for screening                         purposes. Procedure Code(s):     --- Professional ---                        G2952, Colorectal cancer screening; colonoscopy on                         individual at high risk Diagnosis Code(s):     --- Professional ---                        Z86.010, Personal history of colonic polyps CPT copyright 2022 American Medical Association. All rights reserved. The codes documented in this report are preliminary and upon coder review may  be revised to meet current compliance requirements. Dr. Libby Maw Toney Reil MD, MD 10/20/2023 10:39:57 AM This report has been signed electronically. Number of Addenda: 0 Note Initiated On: 10/20/2023 10:16 AM Scope Withdrawal Time: 0 hours 9 minutes 44 seconds  Total Procedure Duration: 0 hours 12 minutes 0 seconds  Estimated Blood Loss:  Estimated blood loss: none.      [facility]
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
## 2023-10-21 ENCOUNTER — Encounter: Payer: Self-pay | Admitting: Gastroenterology

## 2023-10-26 ENCOUNTER — Ambulatory Visit: Admitting: Family

## 2023-10-26 ENCOUNTER — Encounter: Payer: Self-pay | Admitting: Family

## 2023-10-26 VITALS — BP 102/60 | HR 84 | Temp 98.6°F | Ht 67.0 in | Wt 120.4 lb

## 2023-10-26 DIAGNOSIS — Z720 Tobacco use: Secondary | ICD-10-CM | POA: Insufficient documentation

## 2023-10-26 DIAGNOSIS — M5441 Lumbago with sciatica, right side: Secondary | ICD-10-CM | POA: Insufficient documentation

## 2023-10-26 DIAGNOSIS — M419 Scoliosis, unspecified: Secondary | ICD-10-CM | POA: Insufficient documentation

## 2023-10-26 DIAGNOSIS — M4187 Other forms of scoliosis, lumbosacral region: Secondary | ICD-10-CM

## 2023-10-26 MED ORDER — MELOXICAM 7.5 MG PO TABS: 7.5000 mg | ORAL_TABLET | Freq: Every day | ORAL | 0 refills | Status: DC

## 2023-10-26 MED ORDER — TIZANIDINE HCL 4 MG PO TABS
ORAL_TABLET | ORAL | 0 refills | Status: DC
Start: 2023-10-26 — End: 2023-11-03

## 2023-10-26 MED ORDER — PREDNISONE 10 MG (21) PO TBPK: ORAL_TABLET | ORAL | 0 refills | Status: DC

## 2023-10-26 MED ORDER — VARENICLINE TARTRATE (STARTER) 0.5 MG X 11 & 1 MG X 42 PO TBPK: ORAL_TABLET | ORAL | 0 refills | Status: DC

## 2023-10-26 MED ORDER — VARENICLINE TARTRATE 1 MG PO TABS: 1.0000 mg | ORAL_TABLET | Freq: Two times a day (BID) | ORAL | 1 refills | Status: AC

## 2023-10-26 NOTE — Patient Instructions (Addendum)
  Start with prednisone and muscle relaxer Do not take NSAIDS such as ibuprofen/motrin/aleve while taking prednisone and or the meloxicam  Start trial meloxicam after stopping the prednisone.   A referral was placed today for neurosurgery Please let us know if you have not heard back within 2 weeks about the referral.  A referral was placed today to physical therapy  Please let us know if you have not heard back within 2 weeks about the referral.  Start chantix  Starter pack first then continuing monthly pack   ------------------------------------   Approaches to selecting a tobacco quit date: May either choose a fixed quit date (ie, start varenicline, then quit on day 8) or a flexible quit date (ie, start varenicline, then quit between days 8 to 35). Alternatively, a gradual quit date (ie, start varenicline and reduce smoking 50% by week 4, reduce an additional 50% by week 8, and continue reducing with a goal of complete abstinence by week 12) is acceptable

## 2023-10-26 NOTE — Progress Notes (Signed)
 Established Patient Office Visit  Subjective:   Patient ID: Sonia Smith, female    DOB: 22-Apr-1981  Age: 43 y.o. MRN: [id number]  CC:  Chief Complaint  Patient presents with   Medical Management of Chronic Issues    Pt would like to discuss smoking cessation     HPI: Sonia Smith is a 43 y.o. female presenting on 10/26/2023 for Medical Management of Chronic Issues (Pt would like to discuss smoking cessation )  A few months ago when to ER for back pain and lower abdominal pain, she reported feeling feverish. She states the back pain has been ongoing since then. She is having pain in the sacral aspect and states it is radiates down her lateral sides of her lower back and now with radiation of pain down the right lower extremity. She is having to live on ice packs and heating pads. Ok with lying flat and not moving as this really aggravates the pain. She is on gabapentin 600 mg she takes about four times daily. She is prescribed these from her urologist, Dr. Marcelyn Bruins. She does have scoliosis significant to the right side, she has completed physical therapy many times for this, when she walks she states her 'spine flexes' which she feels contributes to the pain. CT lumbar psine with left convex scoliosis.  She has taken otc tylenol, ibuprofen, and tylenol 4 (which did not provide her with any relief)  Tobacco use: wants to quit smoking. Her boyfriend is using chantix and having success, she'd like to try this. Started dec 2020, 5-6 cigarettes a day.  Has never tried any other modality.  She did have a fever seizure in the past.       ROS: Negative unless specifically indicated above in HPI.   Relevant past medical history reviewed and updated as indicated.   Allergies and medications reviewed and updated.   Current Outpatient Medications:    acetaminophen-codeine (TYLENOL #4) 300-60 MG tablet, Take 1 tablet by mouth every 4 (four) hours as needed., Disp: , Rfl:     acyclovir (ZOVIRAX) 800 MG tablet, TAKE 1 TABLET BY MOUTH EVERY MORNING (Patient taking differently: as needed.), Disp: 90 tablet, Rfl: 2   cholecalciferol (VITAMIN D3) 25 MCG (1000 UNIT) tablet, Take 1,000 Units by mouth once a week., Disp: , Rfl:    clindamycin (CLEOCIN T) 1 % external solution, Apply topically daily., Disp: 30 mL, Rfl: 3   estradiol (ESTRACE) 0.1 MG/GM vaginal cream, Place 1 Applicatorful vaginally at bedtime., Disp: , Rfl:    folic acid (FOLVITE) 400 MCG tablet, Take 400 mcg by mouth once a week., Disp: , Rfl:    gabapentin (NEURONTIN) 800 MG tablet, Take 1,200 mg by mouth 2 (two) times daily., Disp: , Rfl:    linaclotide (LINZESS) 145 MCG CAPS capsule, Take 1 capsule (145 mcg total) by mouth daily before breakfast., Disp: 90 capsule, Rfl: 0   meloxicam (MOBIC) 7.5 MG tablet, Take 1 tablet (7.5 mg total) by mouth daily., Disp: 30 tablet, Rfl: 0   predniSONE (STERAPRED UNI-PAK 21 TAB) 10 MG (21) TBPK tablet, Take as directed, Disp: 1 each, Rfl: 0   tiZANidine (ZANAFLEX) 4 MG tablet, Take 1/2 to 1 tablet po at bedtime prn muscle spasm, Disp: 30 tablet, Rfl: 0   traZODone (DESYREL) 25 mg TABS tablet, Take 25 mg by mouth at bedtime., Disp: , Rfl:    varenicline (CHANTIX CONTINUING MONTH PAK) 1 MG tablet, Take 1 tablet (1 mg total) by mouth  2 (two) times daily., Disp: 60 tablet, Rfl: 1   Varenicline Tartrate, Starter, (CHANTIX STARTING MONTH PAK) 0.5 MG X 11 & 1 MG X 42 TBPK, Take one 0.5 mg tablet by mouth once daily for 3 days, then increase to one 0.5 mg tablet twice daily for 4 days, then increase to one 1 mg tablet twice daily., Disp: 1 each, Rfl: 0  Allergies  Allergen Reactions   Beef (Bovine) Protein Nausea And Vomiting and Other (See Comments)    diarrhea   Pork-Derived Products Nausea And Vomiting and Other (See Comments)    diarrhea   Other Other (See Comments) and Nausea And Vomiting    diarrhea   Adhesive [Tape] Rash    From bandaides. Tape is OK.   Alpha-Gal  Other (See Comments), Dermatitis, Diarrhea, Hives, Itching, Nausea And Vomiting, Palpitations and Rash   Wound Dressing Adhesive Rash    From bandaides. Tape is OK.    Objective:   BP 102/60 (BP Location: Left Arm, Patient Position: Sitting, Cuff Size: Normal)   Pulse 84   Temp 98.6 F (37 C) (Temporal)   Ht 5\' 7"  (1.702 m)   Wt 120 lb 6.4 oz (54.6 kg)   SpO2 96%   BMI 18.86 kg/m    Physical Exam Constitutional:      General: She is not in acute distress.    Appearance: Normal appearance. She is normal weight. She is not ill-appearing, toxic-appearing or diaphoretic.  HENT:     Head: Normocephalic.  Cardiovascular:     Rate and Rhythm: Normal rate.  Pulmonary:     Effort: Pulmonary effort is normal.  Musculoskeletal:     Lumbar back: Spasms and bony tenderness present. No swelling. Decreased range of motion. Positive right straight leg raise test. Scoliosis present.  Neurological:     General: No focal deficit present.     Mental Status: She is alert and oriented to person, place, and time. Mental status is at baseline.  Psychiatric:        Mood and Affect: Mood normal.        Behavior: Behavior normal.        Thought Content: Thought content normal.        Judgment: Judgment normal.     Assessment & Plan:  Other form of scoliosis of lumbosacral spine Assessment & Plan: With worsening pain  Referral to neurosurgeron Referred as well to physical therapy   Orders: -     Meloxicam; Take 1 tablet (7.5 mg total) by mouth daily.  Dispense: 30 tablet; Refill: 0 -     Ambulatory referral to Neurosurgery -     Ambulatory referral to Physical Therapy  Acute right-sided low back pain with right-sided sciatica Assessment & Plan: Rx prednisone pack take as directed Referral to physical therapy Referral to neurosurg  Reviewed CT lumbar spine Cont gabapentin Trial meloxicam  Orders: -     Ambulatory referral to Neurosurgery -     predniSONE; Take as directed  Dispense:  1 each; Refill: 0 -     tiZANidine HCl; Take 1/2 to 1 tablet po at bedtime prn muscle spasm  Dispense: 30 tablet; Refill: 0 -     Ambulatory referral to Physical Therapy  Tobacco abuse Assessment & Plan: Smoking cessation instruction/counseling given:  counseled patient on the dangers of tobacco use, advised patient to stop smoking, and reviewed strategies to maximize success trial chantix, seizure history only x 1 with febrile seizure as an infant not since. She  is aware of risk for decreased seizure threshold and still wishes to proceed.      Orders: -     Varenicline Tartrate (Starter); Take one 0.5 mg tablet by mouth once daily for 3 days, then increase to one 0.5 mg tablet twice daily for 4 days, then increase to one 1 mg tablet twice daily.  Dispense: 1 each; Refill: 0 -     Varenicline Tartrate; Take 1 tablet (1 mg total) by mouth 2 (two) times daily.  Dispense: 60 tablet; Refill: 1     Follow up plan: Return if symptoms worsen or fail to improve.  Mort Sawyers, FNP

## 2023-10-26 NOTE — Assessment & Plan Note (Signed)
 Smoking cessation instruction/counseling given:  counseled patient on the dangers of tobacco use, advised patient to stop smoking, and reviewed strategies to maximize success trial chantix, seizure history only x 1 with febrile seizure as an infant not since. She is aware of risk for decreased seizure threshold and still wishes to proceed.

## 2023-10-26 NOTE — Assessment & Plan Note (Signed)
 With worsening pain  Referral to neurosurgeron Referred as well to physical therapy

## 2023-10-26 NOTE — Assessment & Plan Note (Signed)
 Rx prednisone pack take as directed Referral to physical therapy Referral to neurosurg  Reviewed CT lumbar spine Cont gabapentin Trial meloxicam
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
## 2023-11-03 ENCOUNTER — Encounter: Payer: Self-pay | Admitting: Family

## 2023-11-03 ENCOUNTER — Ambulatory Visit: Admitting: Neurosurgery

## 2023-11-03 VITALS — BP 104/64 | Ht 67.0 in | Wt 116.0 lb

## 2023-11-03 DIAGNOSIS — M419 Scoliosis, unspecified: Secondary | ICD-10-CM

## 2023-11-03 DIAGNOSIS — G8929 Other chronic pain: Secondary | ICD-10-CM | POA: Diagnosis not present

## 2023-11-03 DIAGNOSIS — M545 Low back pain, unspecified: Secondary | ICD-10-CM

## 2023-11-03 DIAGNOSIS — M5416 Radiculopathy, lumbar region: Secondary | ICD-10-CM | POA: Diagnosis not present

## 2023-11-03 DIAGNOSIS — M4100 Infantile idiopathic scoliosis, site unspecified: Secondary | ICD-10-CM

## 2023-11-09 ENCOUNTER — Other Ambulatory Visit: Payer: Self-pay | Admitting: Neurosurgery

## 2023-11-09 ENCOUNTER — Ambulatory Visit
Admission: RE | Admit: 2023-11-09 | Discharge: 2023-11-09 | Disposition: A | Discharge status: Home / Self Care | Source: Ambulatory Visit | Attending: Neurosurgery | Admitting: Neurosurgery

## 2023-11-09 ENCOUNTER — Ambulatory Visit
Admission: RE | Admit: 2023-11-09 | Discharge: 2023-11-09 | Disposition: A | Discharge status: Home / Self Care | Source: Ambulatory Visit | Attending: Neurosurgery | Admitting: *Deleted

## 2023-11-09 DIAGNOSIS — M5416 Radiculopathy, lumbar region: Secondary | ICD-10-CM | POA: Diagnosis not present

## 2023-11-09 DIAGNOSIS — M419 Scoliosis, unspecified: Secondary | ICD-10-CM | POA: Diagnosis not present

## 2023-11-09 DIAGNOSIS — M4100 Infantile idiopathic scoliosis, site unspecified: Secondary | ICD-10-CM

## 2023-11-09 DIAGNOSIS — M4805 Spinal stenosis, thoracolumbar region: Secondary | ICD-10-CM | POA: Diagnosis not present

## 2023-11-09 DIAGNOSIS — M48061 Spinal stenosis, lumbar region without neurogenic claudication: Secondary | ICD-10-CM | POA: Diagnosis not present

## 2023-11-24 ENCOUNTER — Ambulatory Visit: Admitting: Gastroenterology

## 2023-12-09 ENCOUNTER — Other Ambulatory Visit: Payer: Self-pay | Admitting: Family

## 2023-12-09 DIAGNOSIS — Z1231 Encounter for screening mammogram for malignant neoplasm of breast: Secondary | ICD-10-CM

## 2023-12-15 ENCOUNTER — Ambulatory Visit: Admitting: Neurosurgery

## 2023-12-17 ENCOUNTER — Ambulatory Visit: Payer: Medicaid Other | Admitting: Dermatology

## 2023-12-23 ENCOUNTER — Ambulatory Visit
Admission: RE | Admit: 2023-12-23 | Discharge: 2023-12-23 | Disposition: A | Discharge status: Home / Self Care | Source: Ambulatory Visit | Attending: Family | Admitting: Family

## 2023-12-23 DIAGNOSIS — Z1231 Encounter for screening mammogram for malignant neoplasm of breast: Secondary | ICD-10-CM | POA: Diagnosis not present

## 2023-12-29 ENCOUNTER — Ambulatory Visit: Payer: Self-pay | Admitting: Family

## 2023-12-31 ENCOUNTER — Ambulatory Visit: Admitting: Dermatology

## 2023-12-31 ENCOUNTER — Encounter: Payer: Self-pay | Admitting: Dermatology

## 2023-12-31 DIAGNOSIS — L700 Acne vulgaris: Secondary | ICD-10-CM | POA: Diagnosis not present

## 2023-12-31 DIAGNOSIS — L811 Chloasma: Secondary | ICD-10-CM | POA: Diagnosis not present

## 2023-12-31 DIAGNOSIS — Z79899 Other long term (current) drug therapy: Secondary | ICD-10-CM

## 2023-12-31 DIAGNOSIS — Z7189 Other specified counseling: Secondary | ICD-10-CM | POA: Diagnosis not present

## 2023-12-31 MED ORDER — TRETINOIN 0.025 % EX CREA: TOPICAL_CREAM | Freq: Every day | CUTANEOUS | 2 refills | Status: AC

## 2023-12-31 MED ORDER — SPIRONOLACTONE 50 MG PO TABS: ORAL_TABLET | ORAL | 2 refills | Status: AC

## 2023-12-31 NOTE — Progress Notes (Signed)
 Follow-Up Visit   Subjective  Sonia Smith is a 43 y.o. female who presents for the following: acne, patient was using clindamycin  solution, uses OTC acne medications. She has used OTC Differin but thinks it made melasma worse. Patient advises she did a course of accutane in 2010 and has taken spironolactone in the past.  Patient used [employer] hydroquinone last year for 3 months and thinks melasma has improved. Uses sunscreen daily.  The patient has spots, moles and lesions to be evaluated, some may be new or changing and the patient may have concern these could be cancer.   The following portions of the chart were reviewed this encounter and updated as appropriate: medications, allergies, medical history  Review of Systems:  No other skin or systemic complaints except as noted in HPI or Assessment and Plan.  Objective  Well appearing patient in no apparent distress; mood and affect are within normal limits.   A focused examination was performed of the following areas: face  Relevant exam findings are noted in the Assessment and Plan.    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 Condition is bothersome/symptomatic for patient. Currently flared.  Treatment Plan: Start spironolactone 50 mg once daily x 2 weeks, if feeling ok on the 50 mg daily may increase to 100 mg daily.  Start tretinoin  0.025% cr twice weekly at night increasing to nightly as tolerated.   Spironolactone can cause increased urination and cause blood pressure to decrease. Please watch for signs of lightheadedness and be cautious when changing position. It can sometimes cause breast tenderness or an irregular period in premenopausal women. It can also increase potassium. The increase in potassium usually is not a concern unless you are taking other  medicines that also increase potassium, so please be sure your doctor knows all of the other medications you are taking. This medication should not be taken by pregnant women.  This medicine should also not be taken together with sulfa  drugs like Bactrim  (trimethoprim /sulfamethexazole).   Topical retinoid medications like tretinoin /Retin-A , adapalene/Differin, tazarotene/Fabior, and Epiduo/Epiduo Forte can cause dryness and irritation when first started. Only apply a pea-sized amount to the entire affected area. Avoid applying it around the eyes, edges of mouth and creases at the nose. If you experience irritation, use a good moisturizer first and/or apply the medicine less often. If you are doing well with the medicine, you can increase how often you use it until you are applying every night. Be careful with sun protection while using this medication as it can make you sensitive to the sun. This medicine should not be used by pregnant women.    MELASMA Exam: significantly improved reticulated hyperpigmented patches at face  Chronic condition with duration or expected duration over one year. Currently well-controlled.  Melasma is a chronic; persistent condition of hyperpigmented patches generally on the face, worse in summer due to higher UV exposure.    Heredity; thyroid  disease; sun exposure; pregnancy; birth control pills; epilepsy medication and darker skin may predispose to Melasma.   Recommendations include: - Sun avoidance and daily broad spectrum (UVA/UVB) tinted mineral sunscreen SPF 30+, with Zinc or Titanium Dioxide. - Rx topical bleaching creams (i.e. hydroquinone) is a common treatment but should not be used long term.  Hydroquinones may be mixed with retinoids; vitamin C; steroids; Kojic Acid. - Alastin A-luminate, retinoids, vitamin C,  topical tranexamic acid, glycolic acid and kojic acid can be used for brightening while on break from hydroquinone - Rx Azelaic Acid is also a treatment  option that is safe for pregnancy (Category B). - OTC Heliocare can be helpful in control and prevention. - Oral Rx with Tranexamic Acid 250 mg - 650 mg po daily can be used for moderate to severe cases especially during summer (contraindications include pregnancy; lactation; hx of PE; hx of DVT; clotting disorder; heart disease; anticoagulant use and upcoming long trips)   - Chemical peels (would need multiple for best result).  - Lasers and  Microdermabrasion may also be helpful adjunct treatments.  Treatment Plan: Sunscreen daily prn   ACNE VULGARIS   Related Medications spironolactone (ALDACTONE) 50 MG tablet Take 1 daily x 2 weeks and if feeling ok may increase to 2 po every day tretinoin  (RETIN-A ) 0.025 % cream Apply topically at bedtime. MELASMA   MEDICATION MANAGEMENT   COUNSELING AND COORDINATION OF CARE    Return in about 3 months (around 04/01/2024) for acne.  Kerstin Peeling, RMA, am acting as scribe for Harris Liming, MD .   Documentation: I have reviewed the above documentation for accuracy and completeness, and I agree with the above.  Harris Liming, MD

## 2023-12-31 NOTE — Patient Instructions (Signed)
 Treatment Plan: Start spironolactone 50 mg once daily x 2 weeks, if feeling ok on the 50 mg daily may increase to 100 mg daily.  Start tretinoin  0.025% cr twice nightly increasing to nightly as tolerated.   Spironolactone can cause increased urination and cause blood pressure to decrease. Please watch for signs of lightheadedness and be cautious when changing position. It can sometimes cause breast tenderness or an irregular period in premenopausal women. It can also increase potassium. The increase in potassium usually is not a concern unless you are taking other medicines that also increase potassium, so please be sure your doctor knows all of the other medications you are taking. This medication should not be taken by pregnant women.  This medicine should also not be taken together with sulfa  drugs like Bactrim  (trimethoprim /sulfamethexazole).   Topical retinoid medications like tretinoin /Retin-A , adapalene/Differin, tazarotene/Fabior, and Epiduo/Epiduo Forte can cause dryness and irritation when first started. Only apply a pea-sized amount to the entire affected area. Avoid applying it around the eyes, edges of mouth and creases at the nose. If you experience irritation, use a good moisturizer first and/or apply the medicine less often. If you are doing well with the medicine, you can increase how often you use it until you are applying every night. Be careful with sun protection while using this medication as it can make you sensitive to the sun. This medicine should not be used by pregnant women.   Due to recent changes in healthcare laws, you may see results of your pathology and/or laboratory studies on MyChart before the doctors have had a chance to review them. We understand that in some cases there may be results that are confusing or concerning to you. Please understand that not all results are received at the same time and often the doctors may need to interpret multiple results in order to  provide you with the best plan of care or course of treatment. Therefore, we ask that you please give us  2 business days to thoroughly review all your results before contacting the office for clarification. Should we see a critical lab result, you will be contacted sooner.   If You Need Anything After Your Visit  If you have any questions or concerns for your doctor, please call our main line at [phone] and press option 4 to reach your doctor's medical assistant. If no one answers, please leave a voicemail as directed and we will return your call as soon as possible. Messages left after 4 pm will be answered the following business day.   You may also send us  a message via MyChart. We typically respond to MyChart messages within 1-2 business days.  For prescription refills, please ask your pharmacy to contact our office. Our fax number is [phone].  If you have an urgent issue when the clinic is closed that cannot wait until the next business day, you can page your doctor at the number below.    Please note that while we do our best to be available for urgent issues outside of office hours, we are not available 24/7.   If you have an urgent issue and are unable to reach us , you may choose to seek medical care at your doctor's office, retail clinic, urgent care center, or emergency room.  If you have a medical emergency, please immediately call 911 or go to the emergency department.  Pager Numbers  - Dr. Bary Likes: [phone]  - Dr. Annette Barters: [phone]  - Dr. Felipe Horton: [phone]   In  the event of inclement weather, please call our main line at [phone] for an update on the status of any delays or closures.  Dermatology Medication Tips: Please keep the boxes that topical medications come in in order to help keep track of the instructions about where and how to use these. Pharmacies typically print the medication instructions only on the boxes and not directly on the  medication tubes.   If your medication is too expensive, please contact our office at [phone] option 4 or send us  a message through MyChart.   We are unable to tell what your co-pay for medications will be in advance as this is different depending on your insurance coverage. However, we may be able to find a substitute medication at lower cost or fill out paperwork to get insurance to cover a needed medication.   If a prior authorization is required to get your medication covered by your insurance company, please allow us  1-2 business days to complete this process.  Drug prices often vary depending on where the prescription is filled and some pharmacies may offer cheaper prices.  The website www.goodrx.com contains coupons for medications through different pharmacies. The prices here do not account for what the cost may be with help from insurance (it may be cheaper with your insurance), but the website can give you the price if you did not use any insurance.  - You can print the associated coupon and take it with your prescription to the pharmacy.  - You may also stop by our office during regular business hours and pick up a GoodRx coupon card.  - If you need your prescription sent electronically to a different pharmacy, notify our office through [facility] or by phone at [phone] option 4.     Si Usted Necesita Algo Despus de Su Visita  Tambin puede enviarnos un mensaje a travs de Clinical cytogeneticist. Por lo general respondemos a los mensajes de MyChart en el transcurso de 1 a 2 das hbiles.  Para renovar recetas, por favor pida a su farmacia que se ponga en contacto con nuestra oficina. Franz Jacks de fax es [place] [phone].  Si tiene un asunto urgente cuando la clnica est cerrada y que no puede esperar hasta el siguiente da hbil, puede llamar/localizar a su doctor(a) al nmero que aparece a continuacin.   Por favor, tenga en cuenta que aunque hacemos todo lo posible  para estar disponibles para asuntos urgentes fuera del horario de [place], no estamos disponibles las 24 horas del da, los 7 [address]  [address] de la [place].   Si tiene un problema urgente y no puede comunicarse con nosotros, puede optar por buscar atencin mdica  en el consultorio de su doctor(a), en una clnica privada, en un centro de atencin urgente o en una sala de emergencias.  Si tiene Engineer, drilling, por favor llame inmediatamente al 911 o vaya a la sala de emergencias.  Nmeros de bper  - Dr. Bary Likes: [phone]  - Dra. Annette Barters: [phone]  - Dr. Felipe Horton: [phone]   En caso de inclemencias del tiempo, por favor llame a Lajuan Pila principal al [phone] para una actualizacin sobre el [place] de cualquier retraso o cierre.  Consejos para la medicacin en dermatologa: Por favor, guarde las cajas en las que vienen los medicamentos de uso tpico para ayudarle a seguir las instrucciones sobre dnde y cmo usarlos. Las farmacias generalmente imprimen las instrucciones del medicamento slo en las cajas y no directamente en los tubos del [place].  Si su medicamento es muy caro, por favor, pngase en contacto con Bettyjane Brunet llamando al [phone] y presione la opcin 4 o envenos un mensaje a travs de Clinical cytogeneticist.   No podemos decirle cul ser su copago por los medicamentos por adelantado ya que esto es diferente dependiendo de la cobertura de su seguro. Sin embargo, es posible que podamos encontrar un medicamento sustituto a Audiological scientist un formulario para que el seguro cubra el medicamento que se considera necesario.   Si se requiere una autorizacin previa para que su compaa de seguros [place] su medicamento, por favor permtanos de 1 a 2 das hbiles para completar este proceso.  Los precios de los medicamentos varan con frecuencia dependiendo del Environmental consultant de dnde se surte la receta y alguna farmacias pueden ofrecer precios ms baratos.  El sitio web  www.goodrx.com tiene cupones para medicamentos de Health and safety inspector. Los precios aqu no tienen en cuenta lo que podra costar con la ayuda del seguro (puede ser ms barato con su seguro), pero el sitio web puede darle el precio si no utiliz Tourist information centre manager.  - Puede imprimir el cupn correspondiente y llevarlo con su receta a la farmacia.  - Tambin puede pasar por nuestra oficina durante el horario de atencin regular y Education officer, museum una tarjeta de cupones de GoodRx.  - Si necesita que su receta se enve electrnicamente a una farmacia diferente, informe a nuestra oficina a travs de MyChart de [place] o por telfono llamando al [phone] y presione la opcin 4.

## 2024-01-08 ENCOUNTER — Ambulatory Visit: Admitting: Family

## 2024-01-08 ENCOUNTER — Encounter: Payer: Self-pay | Admitting: Family

## 2024-01-08 VITALS — BP 100/62 | HR 97 | Temp 98.8°F | Ht 67.0 in | Wt 121.0 lb

## 2024-01-08 DIAGNOSIS — Z91018 Allergy to other foods: Secondary | ICD-10-CM

## 2024-01-08 DIAGNOSIS — E538 Deficiency of other specified B group vitamins: Secondary | ICD-10-CM

## 2024-01-08 DIAGNOSIS — Z720 Tobacco use: Secondary | ICD-10-CM | POA: Diagnosis not present

## 2024-01-08 DIAGNOSIS — Z0000 Encounter for general adult medical examination without abnormal findings: Secondary | ICD-10-CM | POA: Diagnosis not present

## 2024-01-08 DIAGNOSIS — E162 Hypoglycemia, unspecified: Secondary | ICD-10-CM

## 2024-01-08 DIAGNOSIS — F5101 Primary insomnia: Secondary | ICD-10-CM

## 2024-01-08 DIAGNOSIS — E559 Vitamin D deficiency, unspecified: Secondary | ICD-10-CM

## 2024-01-08 DIAGNOSIS — E8940 Asymptomatic postprocedural ovarian failure: Secondary | ICD-10-CM

## 2024-01-08 DIAGNOSIS — Z90710 Acquired absence of both cervix and uterus: Secondary | ICD-10-CM

## 2024-01-08 DIAGNOSIS — E782 Mixed hyperlipidemia: Secondary | ICD-10-CM | POA: Diagnosis not present

## 2024-01-08 DIAGNOSIS — N951 Menopausal and female climacteric states: Secondary | ICD-10-CM | POA: Insufficient documentation

## 2024-01-08 LAB — LIPID PANEL
Cholesterol: 159 mg/dL (ref 0–200)
HDL: 47.3 mg/dL (ref >39.00)
LDL Cholesterol: 98 mg/dL (ref 0–99)
NonHDL: 111.99
Total CHOL/HDL Ratio: 3
Triglycerides: 69 mg/dL (ref 0.0–149.0)
VLDL: 13.8 mg/dL (ref 0.0–40.0)

## 2024-01-08 LAB — HEMOGLOBIN A1C: Hgb A1c MFr Bld: 5.1 % (ref 4.6–6.5)

## 2024-01-08 LAB — BASIC METABOLIC PANEL WITH GFR
BUN: 10 mg/dL (ref 6–23)
CO2: 30 meq/L (ref 19–32)
Calcium: 10 mg/dL (ref 8.4–10.5)
Chloride: 103 meq/L (ref 96–112)
Creatinine, Ser: 0.94 mg/dL (ref 0.40–1.20)
GFR: 74.64 mL/min (ref >60.00)
Glucose, Bld: 91 mg/dL (ref 70–99)
Potassium: 5 meq/L (ref 3.5–5.1)
Sodium: 140 meq/L (ref 135–145)

## 2024-01-08 MED ORDER — TRAZODONE HCL 50 MG PO TABS: 25.0000 mg | ORAL_TABLET | Freq: Every evening | ORAL | 3 refills | Status: DC | PRN

## 2024-01-08 MED ORDER — ESTRADIOL 0.5 MG PO TABS
0.5000 mg | ORAL_TABLET | Freq: Every day | ORAL | 0 refills | Status: AC
Start: 2024-01-08 — End: ?

## 2024-01-08 MED ORDER — VARENICLINE TARTRATE (STARTER) 0.5 MG X 11 & 1 MG X 42 PO TBPK: ORAL_TABLET | ORAL | 0 refills | Status: AC

## 2024-01-08 NOTE — Assessment & Plan Note (Signed)

## 2024-01-08 NOTE — Assessment & Plan Note (Signed)
 Discussed options High risk with estrogen as back smoking Sent in 0.5 mg estradiol  pill however advised pt not to take until she quits smoking Did discuss increased risk for DVT and stroke.   Starting her on chantix .  Ordering FSH Tsh and prolactin

## 2024-01-08 NOTE — Assessment & Plan Note (Signed)
 Ordered vitamin d pending results.

## 2024-01-08 NOTE — Assessment & Plan Note (Signed)
 Ordered lipid panel, pending results. Work on low cholesterol diet and exercise as tolerated

## 2024-01-08 NOTE — Assessment & Plan Note (Signed)
 Restart trazodone  50 mg 1-2 tab prn insomnia.

## 2024-01-08 NOTE — Assessment & Plan Note (Signed)
 Smoking cessation instruction/counseling given:  counseled patient on the dangers of tobacco use, advised patient to stop smoking, and reviewed strategies to maximize success

## 2024-01-08 NOTE — Assessment & Plan Note (Signed)
 Repeat today  Advised pt against eating alpha gal products

## 2024-01-08 NOTE — Progress Notes (Signed)
 Subjective:  Patient ID: Sonia Smith, female    DOB: 1981-03-15  Age: 43 y.o. MRN: [id number]  Patient Care Team: Felicita Horns, FNP as PCP - General (Family Medicine) Boyce Byes, MD as PCP - Electrophysiology (Cardiology) Jhonny Moss, MD as Consulting Physician (Neurology)   CC:  Chief Complaint  Patient presents with   Annual Exam    HPI Sonia Smith is a 43 y.o. female who presents today for an annual physical exam. She reports consuming a general diet. The patient does not participate in regular exercise at present. She generally feels well. She reports sleeping poorly. She does not have additional problems to discuss today.   Vision:Within last year Dental:Receives regular dental care  Mammogram: 12/23/23  Pt is without acute concerns.   She has poor sleep for years only sleeps about 3 hours a night. She has trouble falling asleep and if she does fall asleep she wakes up. She has been told that she snores but only when she is sick. She did have a sleep study test a few years ago and she states she was negative. She was given trazodone  in the past.   Acne: recently started seeing dermatology for this. Started on spironolactone .   Does c/o night sweats and now with increased acne she has seen dermatology who suggested might be hormonal. She had hysterectomy when she was 43 years old, still with both ovaries. No abn bleeding. The hysterectomy was removed from endometriosis and or bad uterine fibroids she can't remember. She was taking estrace  0.1 mg daily at bedtime as she had hysterectomy.   Smoking, she did quit however once school started again last week she started again ad is actively trying to quit on her own.    Wt Readings from Last 3 Encounters:  01/08/24 121 lb (54.9 kg)  11/03/23 116 lb (52.6 kg)  10/26/23 120 lb 6.4 oz (54.6 kg)     Advanced Directives Patient does not have advanced directives    DEPRESSION SCREENING     01/08/2024    8:09 AM 10/26/2023    8:47 AM 07/22/2023    2:11 PM 06/15/2023    9:14 AM 12/25/2022   11:56 AM 06/10/2021   10:41 AM 05/21/2020    3:31 PM  PHQ 2/9 Scores  PHQ - 2 Score 0 0 0 0 3 0 0  PHQ- 9 Score 12 7  9 12 6  0     ROS: Negative unless specifically indicated above in HPI.    Current Outpatient Medications:    acetaminophen -codeine (TYLENOL  #4) 300-60 MG tablet, Take 1 tablet by mouth every 4 (four) hours as needed., Disp: , Rfl:    acyclovir  (ZOVIRAX ) 800 MG tablet, TAKE 1 TABLET BY MOUTH EVERY MORNING (Patient taking differently: as needed.), Disp: 90 tablet, Rfl: 2   cholecalciferol (VITAMIN D3) 25 MCG (1000 UNIT) tablet, Take 1,000 Units by mouth once a week., Disp: , Rfl:    estradiol  (ESTRACE ) 0.5 MG tablet, Take 1 tablet (0.5 mg total) by mouth daily., Disp: 90 tablet, Rfl: 0   folic acid  (FOLVITE ) 400 MCG tablet, Take 400 mcg by mouth once a week., Disp: , Rfl:    gabapentin (NEURONTIN) 800 MG tablet, Take 1,200 mg by mouth 2 (two) times daily., Disp: , Rfl:    linaclotide  (LINZESS ) 145 MCG CAPS capsule, Take 1 capsule (145 mcg total) by mouth daily before breakfast., Disp: 90 capsule, Rfl: 0   spironolactone  (ALDACTONE ) 50 MG tablet, Take 1  daily x 2 weeks and if feeling ok may increase to 2 po every day, Disp: 60 tablet, Rfl: 2   traZODone  (DESYREL ) 25 mg TABS tablet, Take 25 mg by mouth at bedtime., Disp: , Rfl:    traZODone  (DESYREL ) 50 MG tablet, Take 0.5-1 tablets (25-50 mg total) by mouth at bedtime as needed for sleep., Disp: 30 tablet, Rfl: 3   tretinoin  (RETIN-A ) 0.025 % cream, Apply topically at bedtime., Disp: 45 g, Rfl: 2   Varenicline  Tartrate, Starter, (CHANTIX  STARTING MONTH PAK) 0.5 MG X 11 & 1 MG X 42 TBPK, Take one 0.5 mg tablet by mouth once daily for 3 days, then increase to one 0.5 mg tablet twice daily for 4 days, then increase to one 1 mg tablet twice daily., Disp: 1 each, Rfl: 0    Objective:    BP 100/62 (BP Location: Left Arm, Patient  Position: Sitting, Cuff Size: Normal)   Pulse 97   Temp 98.8 F (37.1 C) (Temporal)   Ht 5\' 7"  (1.702 m)   Wt 121 lb (54.9 kg)   SpO2 96%   BMI 18.95 kg/m   BP Readings from Last 3 Encounters:  01/08/24 100/62  11/03/23 104/64  10/26/23 102/60      Physical Exam Constitutional:      General: She is not in acute distress.    Appearance: Normal appearance. She is normal weight. She is not ill-appearing.  HENT:     Head: Normocephalic.     Right Ear: Tympanic membrane normal.     Left Ear: Tympanic membrane normal.     Nose: Nose normal.     Mouth/Throat:     Mouth: Mucous membranes are moist.  Eyes:     Extraocular Movements: Extraocular movements intact.     Pupils: Pupils are equal, round, and reactive to light.  Cardiovascular:     Rate and Rhythm: Normal rate and regular rhythm.  Pulmonary:     Effort: Pulmonary effort is normal.     Breath sounds: Normal breath sounds.  Abdominal:     Tenderness: There is no guarding or rebound.  Musculoskeletal:        General: Normal range of motion.     Cervical back: Normal range of motion.  Skin:    General: Skin is warm.     Capillary Refill: Capillary refill takes less than 2 seconds.  Neurological:     General: No focal deficit present.     Mental Status: She is alert.  Psychiatric:        Mood and Affect: Mood normal.        Behavior: Behavior normal.        Thought Content: Thought content normal.        Judgment: Judgment normal.          Assessment & Plan:  Vitamin B12 deficiency Assessment & Plan: Checking b12 today    Orders: -     Vitamin B12  Low serum vitamin B12  Mixed hyperlipidemia Assessment & Plan: Ordered lipid panel, pending results. Work on low cholesterol diet and exercise as tolerated   Orders: -     Lipid panel  Vitamin D  deficiency Assessment & Plan: Ordered vitamin d  pending results.    Orders: -     VITAMIN D  25 Hydroxy (Vit-D Deficiency, Fractures)  Primary  insomnia Assessment & Plan: Restart trazodone  50 mg 1-2 tab prn insomnia.    Orders: -     traZODone  HCl; Take 0.5-1 tablets (25-50  mg total) by mouth at bedtime as needed for sleep.  Dispense: 30 tablet; Refill: 3  Allergy to alpha-gal Assessment & Plan: Repeat today  Advised pt against eating alpha gal products   Orders: -     Alpha-Gal Panel  Hypoglycemia -     Basic metabolic panel with GFR -     Hemoglobin A1c  Post hysterectomy menopause Assessment & Plan: Discussed options High risk with estrogen as back smoking Sent in 0.5 mg estradiol  pill however advised pt not to take until she quits smoking Did discuss increased risk for DVT and stroke.   Starting her on chantix .  Ordering FSH Tsh and prolactin   Orders: -     Follicle stimulating hormone -     Prolactin -     TSH -     Estradiol ; Take 1 tablet (0.5 mg total) by mouth daily.  Dispense: 90 tablet; Refill: 0  Hot flashes due to menopause -     Estradiol ; Take 1 tablet (0.5 mg total) by mouth daily.  Dispense: 90 tablet; Refill: 0  Tobacco abuse Assessment & Plan: Smoking cessation instruction/counseling given:  counseled patient on the dangers of tobacco use, advised patient to stop smoking, and reviewed strategies to maximize success   Orders: -     Varenicline  Tartrate (Starter); Take one 0.5 mg tablet by mouth once daily for 3 days, then increase to one 0.5 mg tablet twice daily for 4 days, then increase to one 1 mg tablet twice daily.  Dispense: 1 each; Refill: 0  Encounter for general adult medical examination without abnormal findings Assessment & Plan: Patient Counseling(The following topics were reviewed):  Preventative care handout given to pt  Health maintenance and immunizations reviewed. Please refer to Health maintenance section. Pt advised on safe sex, wearing seatbelts in car, and proper nutrition labwork ordered today for annual Dental health: Discussed importance of regular tooth  brushing, flossing, and dental visits.        Follow-up: Return in about 6 weeks (around 02/19/2024) for follow up smoking cessation .   Felicita Horns, FNP

## 2024-01-08 NOTE — Assessment & Plan Note (Signed)
Checking b12 today.

## 2024-01-11 ENCOUNTER — Ambulatory Visit: Payer: Self-pay | Admitting: Family

## 2024-01-13 LAB — INTERPRETATION:

## 2024-01-13 LAB — ALPHA-GAL PANEL
Allergen, Mutton, f88: 3.7 kU/L — ABNORMAL HIGH
Allergen, Pork, f26: 1.63 kU/L — ABNORMAL HIGH
Beef: 4.66 kU/L — ABNORMAL HIGH
CLASS: 2
CLASS: 3
Class: 3
GALACTOSE-ALPHA-1,3-GALACTOSE IGE*: 11 kU/L — ABNORMAL HIGH (ref <0.10)

## 2024-01-13 LAB — PROLACTIN: Prolactin: 9.4 ng/mL

## 2024-01-14 LAB — VITAMIN B12: Vitamin B-12: 387 pg/mL (ref 211–911)

## 2024-01-14 LAB — FOLLICLE STIMULATING HORMONE: FSH: 3.3 m[IU]/mL

## 2024-01-14 LAB — VITAMIN D 25 HYDROXY (VIT D DEFICIENCY, FRACTURES): VITD: 52.71 ng/mL (ref 30.00–100.00)

## 2024-01-14 LAB — TSH: TSH: 1.16 u[IU]/mL (ref 0.35–5.50)

## 2024-03-31 ENCOUNTER — Ambulatory Visit: Admitting: Dermatology

## 2024-04-15 ENCOUNTER — Other Ambulatory Visit: Payer: Self-pay | Admitting: *Deleted

## 2024-04-15 DIAGNOSIS — K5909 Other constipation: Secondary | ICD-10-CM

## 2024-04-15 MED ORDER — LINACLOTIDE 145 MCG PO CAPS: 145.0000 ug | ORAL_CAPSULE | Freq: Every day | ORAL | 1 refills | Status: AC

## 2024-06-10 ENCOUNTER — Other Ambulatory Visit: Payer: Self-pay | Admitting: Family

## 2024-06-10 ENCOUNTER — Other Ambulatory Visit: Payer: Self-pay | Admitting: *Deleted

## 2024-06-10 DIAGNOSIS — F5101 Primary insomnia: Secondary | ICD-10-CM

## 2024-06-10 MED ORDER — TRAZODONE HCL 50 MG PO TABS: 25.0000 mg | ORAL_TABLET | Freq: Every evening | ORAL | 3 refills | Status: AC | PRN

## 2024-06-10 NOTE — Telephone Encounter (Signed)
 Copied from CRM [id number]. Topic: Clinical - Medication Refill >> Jun 10, 2024 11:07 AM China J wrote: Medication: traZODone  (DESYREL ) 50 MG  Has the patient contacted their pharmacy? Yes (Agent: If no, request that the patient contact the pharmacy for the refill. If patient does not wish to contact the pharmacy document the reason why and proceed with request.) (Agent: If yes, when and what did the pharmacy advise?)  This is the patient's preferred pharmacy:  [id number] - [place], [place] - [address] AT [facility] [address] [place] [address] Phone: [phone] Fax: [phone]  Is this the correct pharmacy for this prescription? Yes If no, delete pharmacy and type the correct one.   Has the prescription been filled recently? No  Is the patient out of the medication? No  Has the patient been seen for an appointment in the last year OR does the patient have an upcoming appointment? Yes  Can we respond through MyChart? Yes  Agent: Please be advised that Rx refills may take up to 3 business days. We ask that you follow-up with your pharmacy.

## 2024-08-15 ENCOUNTER — Encounter: Payer: Self-pay | Admitting: *Deleted
